# Patient Record
Sex: Male | Born: 1965 | Race: Black or African American | Hispanic: No | Smoking: Never smoker
Health system: Southern US, Community
[De-identification: ages and names within clinical notes are randomized; demographics above are authoritative.]

## PROBLEM LIST (undated history)

## (undated) DIAGNOSIS — K219 Gastro-esophageal reflux disease without esophagitis: Secondary | ICD-10-CM

## (undated) DIAGNOSIS — H269 Unspecified cataract: Secondary | ICD-10-CM

## (undated) DIAGNOSIS — M1990 Unspecified osteoarthritis, unspecified site: Secondary | ICD-10-CM

## (undated) DIAGNOSIS — C801 Malignant (primary) neoplasm, unspecified: Secondary | ICD-10-CM

## (undated) HISTORY — PX: NOSE SURGERY: SHX723

## (undated) HISTORY — DX: Unspecified cataract: H26.9

## (undated) HISTORY — PX: WISDOM TOOTH EXTRACTION: SHX21

## (undated) HISTORY — DX: Unspecified osteoarthritis, unspecified site: M19.90

## (undated) HISTORY — DX: Malignant (primary) neoplasm, unspecified: C80.1

---

## 2001-12-02 ENCOUNTER — Encounter: Payer: Self-pay | Admitting: Emergency Medicine

## 2001-12-02 ENCOUNTER — Emergency Department (HOSPITAL_COMMUNITY): Admission: EM | Admit: 2001-12-02 | Discharge: 2001-12-02 | Payer: Self-pay | Admitting: Emergency Medicine

## 2011-11-30 ENCOUNTER — Ambulatory Visit (INDEPENDENT_AMBULATORY_CARE_PROVIDER_SITE_OTHER): Payer: 59 | Admitting: Internal Medicine

## 2011-11-30 VITALS — BP 132/84 | HR 80 | Temp 98.1°F | Resp 18 | Ht 71.25 in | Wt 197.0 lb

## 2011-11-30 DIAGNOSIS — B360 Pityriasis versicolor: Secondary | ICD-10-CM

## 2011-11-30 DIAGNOSIS — R509 Fever, unspecified: Secondary | ICD-10-CM

## 2011-11-30 DIAGNOSIS — J029 Acute pharyngitis, unspecified: Secondary | ICD-10-CM

## 2011-11-30 DIAGNOSIS — J329 Chronic sinusitis, unspecified: Secondary | ICD-10-CM

## 2011-11-30 LAB — POCT RAPID STREP A (OFFICE): Rapid Strep A Screen: NEGATIVE

## 2011-11-30 MED ORDER — AMOXICILLIN 500 MG PO CAPS: 1000.0000 mg | ORAL_CAPSULE | Freq: Two times a day (BID) | ORAL | Status: AC

## 2011-11-30 MED ORDER — ITRACONAZOLE 100 MG PO CAPS: 200.0000 mg | ORAL_CAPSULE | Freq: Every day | ORAL | Status: AC

## 2011-11-30 MED ORDER — FLUTICASONE PROPIONATE 50 MCG/ACT NA SUSP: 2.0000 | Freq: Every day | NASAL | Status: DC

## 2011-11-30 NOTE — Progress Notes (Signed)
  Subjective:    Patient ID: Leata Mouse, male    DOB: 1966/04/19, 46 y.o.   MRN: [id number]  Sinusitis This is a new problem. The current episode started in the past 7 days. The problem is unchanged. There has been no fever. The pain is mild. Associated symptoms include chills, congestion, sinus pressure and a sore throat. Pertinent negatives include no coughing or ear pain. Past treatments include acetaminophen. The treatment provided no relief.      Review of Systems  Constitutional: Positive for chills.  HENT: Positive for congestion, sore throat and sinus pressure. Negative for ear pain.   Respiratory: Negative for cough.   Skin: Positive for rash.       Objective:   Physical Exam  Constitutional: He appears well-developed and well-nourished.  HENT:  Head: Normocephalic.  Mouth/Throat: No oropharyngeal exudate.       Nasal turbinates red and swollen with yellow drainage present.  Neck: Neck supple.  Cardiovascular: Normal rate, regular rhythm and normal heart sounds.   Pulmonary/Chest: Effort normal and breath sounds normal.  Abdominal: Soft.  Neurological: Coordination abnormal.  Skin: Rash noted.       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          Assessment & Plan:

## 2011-11-30 NOTE — Patient Instructions (Addendum)
Tinea Versicolor Tinea versicolor is a common yeast infection of the skin. This condition becomes known when the yeast on our skin starts to overgrow (yeast is a normal inhabitant on our skin). This condition is noticed as white or light brown patches on brown skin, and is more evident in the summer on tanned skin. These areas are slightly scaly if scratched. The light patches from the yeast become evident when the yeast creates "holes in your suntan". This is most often noticed in the summer. The patches are usually located on the chest, back, pubis, neck and body folds. However, it may occur on any area of body. Mild itching and inflammation (redness or soreness) may be present. DIAGNOSIS  The diagnosisof this is made clinically (by looking). Cultures from samples are usually not needed. Examination under the microscope may help. However, yeast is normally found on skin. The diagnosis still remains clinical. Examination under Wood's Ultraviolet Light can determine the extent of the infection. TREATMENT  This common infection is usually only of cosmetic (only a concern to your appearance). It is easily treated with dandruff shampoo used during showers or bathing. Vigorous scrubbing will eliminate the yeast over several days time. The light areas in your skin may remain for weeks or months after the infection is cured unless your skin is exposed to sunlight. The lighter or darker spots caused by the fungus that remain after complete treatment are not a sign of treatment failure; it will take a long time to resolve. Your caregiver may recommend a number of commercial preparations or medication by mouth if home care is not working. Recurrence is common and preventative medication may be necessary. This skin condition is not highly contagious. Special care is not needed to protect close friends and family members. Normal hygiene is usually enough. Follow up is required only if you develop complications (such as a  secondary infection from scratching), if recommended by your caregiver, or if no relief is obtained from the preparations used. Document Released: 10/06/2000 Document Revised: 06/21/2011 Document Reviewed: 11/18/2008 [employer] Patient Information 2012 [place], [place]. Sinusitis Sinuses are air pockets within the bones of your face. The growth of bacteria within a sinus leads to infection. The infection prevents the sinuses from draining. This infection is called sinusitis.  Take medication as prescribed.     SYMPTOMS  There will be different areas of pain depending on which sinuses have become infected.  The maxillary sinuses often produce pain beneath the eyes.   Frontal sinusitis may cause pain in the middle of the forehead and above the eyes.  Other problems (symptoms) include:  Toothaches.   Colored, pus-like (purulent) drainage from the nose.   Swelling, warmth, and tenderness over the sinus areas may be signs of infection.  TREATMENT  Sinusitis is most often determined by an exam.X-rays may be taken. If x-rays have been taken, make sure you obtain your results or find out how you are to obtain them. Your caregiver may give you medications (antibiotics). These are medications that will help kill the bacteria causing the infection. You may also be given a medication (decongestant) that helps to reduce sinus swelling.  HOME CARE INSTRUCTIONS   Only take over-the-counter or prescription medicines for pain, discomfort, or fever as directed by your caregiver.   Drink extra fluids. Fluids help thin the mucus so your sinuses can drain more easily.   Applying either moist heat or ice packs to the sinus areas may help relieve discomfort.   Use saline  nasal sprays to help moisten your sinuses. The sprays can be found at your local drugstore.  SEEK IMMEDIATE MEDICAL CARE IF:  You have a fever.   You have increasing pain, severe headaches, or toothache.   You have nausea, vomiting, or  drowsiness.   You develop unusual swelling around the face or trouble seeing.  MAKE SURE YOU:   Understand these instructions.   Will watch your condition.   Will get help right away if you are not doing well or get worse.  Document Released: 10/09/2005 Document Revised: 06/21/2011 Document Reviewed: 05/08/2007 [facility] Patient Information 2012 [place], [place].  Sinusitis Sinuses are air pockets within the bones of your face. The growth of bacteria within a sinus leads to infection. The infection prevents the sinuses from draining. This infection is called sinusitis. SYMPTOMS  There will be different areas of pain depending on which sinuses have become infected.  The maxillary sinuses often produce pain beneath the eyes.   Frontal sinusitis may cause pain in the middle of the forehead and above the eyes.  Other problems (symptoms) include:  Toothaches.   Colored, pus-like (purulent) drainage from the nose.   Swelling, warmth, and tenderness over the sinus areas may be signs of infection.  TREATMENT  Sinusitis is most often determined by an exam.X-rays may be taken. If x-rays have been taken, make sure you obtain your results or find out how you are to obtain them. Your caregiver may give you medications (antibiotics). These are medications that will help kill the bacteria causing the infection. You may also be given a medication (decongestant) that helps to reduce sinus swelling.  HOME CARE INSTRUCTIONS   Only take over-the-counter or prescription medicines for pain, discomfort, or fever as directed by your caregiver.   Drink extra fluids. Fluids help thin the mucus so your sinuses can drain more easily.   Applying either moist heat or ice packs to the sinus areas may help relieve discomfort.   Use saline nasal sprays to help moisten your sinuses. The sprays can be found at your local drugstore.  SEEK IMMEDIATE MEDICAL CARE IF:  You have a fever.   You have increasing pain,  severe headaches, or toothache.   You have nausea, vomiting, or drowsiness.   You develop unusual swelling around the face or trouble seeing.  MAKE SURE YOU:   Understand these instructions.   Will watch your condition.   Will get help right away if you are not doing well or get worse.  Document Released: 10/09/2005 Document Revised: 06/21/2011 Document Reviewed: 05/08/2007 [facility] Patient Information 2012 [place], [place].  Take all the amoxicillin to relieve your sinus pressure and discomfort.

## 2011-12-20 ENCOUNTER — Telehealth: Payer: Self-pay | Admitting: Internal Medicine

## 2011-12-20 MED ORDER — AMOXICILLIN 500 MG PO CAPS: 1000.0000 mg | ORAL_CAPSULE | Freq: Two times a day (BID) | ORAL | Status: AC

## 2011-12-20 NOTE — Telephone Encounter (Signed)
Please tell pt I have sent an prescription to [facility] on [address].

## 2011-12-21 ENCOUNTER — Telehealth: Payer: Self-pay | Admitting: Internal Medicine

## 2011-12-21 NOTE — Telephone Encounter (Signed)
Please tell pt I have sent a replacement Amoxicillin prescription to his pharmacy.   THanks.

## 2011-12-21 NOTE — Telephone Encounter (Signed)
LMOM we [facility]

## 2013-02-06 ENCOUNTER — Telehealth: Payer: Self-pay | Admitting: Radiology

## 2013-02-06 NOTE — Telephone Encounter (Signed)
Error

## 2014-04-08 ENCOUNTER — Ambulatory Visit (INDEPENDENT_AMBULATORY_CARE_PROVIDER_SITE_OTHER): Payer: 59

## 2014-04-08 ENCOUNTER — Ambulatory Visit (INDEPENDENT_AMBULATORY_CARE_PROVIDER_SITE_OTHER): Payer: 59 | Admitting: Family Medicine

## 2014-04-08 VITALS — BP 120/64 | HR 97 | Temp 100.9°F | Resp 15 | Ht 70.0 in | Wt 192.0 lb

## 2014-04-08 DIAGNOSIS — R059 Cough, unspecified: Secondary | ICD-10-CM

## 2014-04-08 DIAGNOSIS — R05 Cough: Secondary | ICD-10-CM

## 2014-04-08 DIAGNOSIS — J019 Acute sinusitis, unspecified: Secondary | ICD-10-CM

## 2014-04-08 DIAGNOSIS — J209 Acute bronchitis, unspecified: Secondary | ICD-10-CM

## 2014-04-08 MED ORDER — AZITHROMYCIN 250 MG PO TABS: ORAL_TABLET | ORAL | Status: DC

## 2014-04-08 MED ORDER — BENZONATATE 100 MG PO CAPS: 200.0000 mg | ORAL_CAPSULE | Freq: Two times a day (BID) | ORAL | Status: DC | PRN

## 2014-04-08 MED ORDER — IPRATROPIUM BROMIDE 0.03 % NA SOLN: 2.0000 | Freq: Two times a day (BID) | NASAL | Status: DC

## 2014-04-08 MED ORDER — HYDROCODONE-HOMATROPINE 5-1.5 MG/5ML PO SYRP: 5.0000 mL | ORAL_SOLUTION | Freq: Every evening | ORAL | Status: DC | PRN

## 2014-04-08 NOTE — Patient Instructions (Signed)
Acute Bronchitis °Bronchitis is inflammation of the airways that extend from the windpipe into the lungs (bronchi). The inflammation often causes mucus to develop. This leads to a cough, which is the most common symptom of bronchitis.  °In acute bronchitis, the condition usually develops suddenly and goes away over time, usually in a couple weeks. Smoking, allergies, and asthma can make bronchitis worse. Repeated episodes of bronchitis may cause further lung problems.  °CAUSES °Acute bronchitis is most often caused by the same virus that causes a cold. The virus can spread from person to person (contagious).  °SIGNS AND SYMPTOMS  °· Cough.   °· Fever.   °· Coughing up mucus.   °· Body aches.   °· Chest congestion.   °· Chills.   °· Shortness of breath.   °· Sore throat.   °DIAGNOSIS  °Acute bronchitis is usually diagnosed through a physical exam. Tests, such as chest X-rays, are sometimes done to rule out other conditions.  °TREATMENT  °Acute bronchitis usually goes away in a couple weeks. Often times, no medical treatment is necessary. Medicines are sometimes given for relief of fever or cough. Antibiotics are usually not needed but may be prescribed in certain situations. In some cases, an inhaler may be recommended to help reduce shortness of breath and control the cough. A cool mist vaporizer may also be used to help thin bronchial secretions and make it easier to clear the chest.  °HOME CARE INSTRUCTIONS °· Get plenty of rest.   °· Drink enough fluids to keep your urine clear or pale yellow (unless you have a medical condition that requires fluid restriction). Increasing fluids may help thin your secretions and will prevent dehydration.   °· Only take over-the-counter or prescription medicines as directed by your health care provider.   °· Avoid smoking and secondhand smoke. Exposure to cigarette smoke or irritating chemicals will make bronchitis worse. If you are a smoker, consider using nicotine gum or skin  patches to help control withdrawal symptoms. Quitting smoking will help your lungs heal faster.   °· Reduce the chances of another bout of acute bronchitis by washing your hands frequently, avoiding people with cold symptoms, and trying not to touch your hands to your mouth, nose, or eyes.   °· Follow up with your health care provider as directed.   °SEEK MEDICAL CARE IF: °Your symptoms do not improve after 1 week of treatment.  °SEEK IMMEDIATE MEDICAL CARE IF: °· You develop an increased fever or chills.   °· You have chest pain.   °· You have severe shortness of breath. °· You have bloody sputum.   °· You develop dehydration. °· You develop fainting. °· You develop repeated vomiting. °· You develop a severe headache. °MAKE SURE YOU:  °· Understand these instructions. °· Will watch your condition. °· Will get help right away if you are not doing well or get worse. °Document Released: 11/16/2004 Document Revised: 06/11/2013 Document Reviewed: 04/01/2013 °[employer]® Patient Information ©2015 [employer]. This information is not intended to replace advice given to you by your health care provider. Make sure you discuss any questions you have with your health care provider. ° °

## 2014-04-08 NOTE — Progress Notes (Addendum)
Chief Complaint:  Chief Complaint  Patient presents with  . Sinusitis    x 6 days. pt has taken antihistamines  . Cough    cough is worse at night    HPI: Ethan Martin is a 48 y.o. male who is here for 6 day history of up and down fevers, + sinus congestion, coughing up his PND, sort of greenish yellow and then clears up and then came back, he has been taking otc meds without relief, he denies SOB,  CP, however has had + chills, fatigue, has lost 12 lbs.  He feels like he is choking on his own saliva and has a garglng noise in his chest when he lays down.  He has hs no othe sick contacts, his daughter was sick.  No n/v/abd pain/rashes   No past medical history on file. No past surgical history on file. History   Social History  . Marital Status: Unknown    Spouse Name: N/A    Number of Children: N/A  . Years of Education: N/A   Social History Main Topics  . Smoking status: Never Smoker   . Smokeless tobacco: None  . Alcohol Use: None  . Drug Use: None  . Sexual Activity: None   Other Topics Concern  . None   Social History Narrative  . None   No family history on file. No Known Allergies Prior to Admission medications   Not on File     ROS: The patient denies night sweats, unintentional weight loss, chest pain, palpitations, wheezing, dyspnea on exertion, nausea, vomiting, abdominal pain, dysuria, hematuria, melena, numbness,  or tingling.   All other systems have been reviewed and were otherwise negative with the exception of those mentioned in the HPI and as above.    PHYSICAL EXAM: Filed Vitals:   04/08/14 1944  BP: 120/64  Pulse: 97  Temp: 100.9 F (38.3 C)  Resp: 15  SPo2 96% Filed Vitals:   04/08/14 1944  Height: 5\' 10"  (1.778 m)  Weight: 192 lb (87.091 kg)   Body mass index is 27.55 kg/(m^2).  General: Alert, no acute distress HEENT:  Normocephalic, atraumatic, oropharynx patent. EOMI, PERRLA, TM nl, fundo exam nl Cardiovascular:   Regular rate and rhythm, no rubs murmurs or gallops.  No Carotid bruits, radial pulse intact. No pedal edema.  Respiratory: Clear to auscultation bilaterally.  No wheezes, rales, or rhonchi.  No cyanosis, no use of accessory musculature GI: No organomegaly, abdomen is soft and non-tender, positive bowel sounds.  No masses. Skin: No rashes. Neurologic: Facial musculature symmetric. CN 2-12 grossly nl, no meningeal signs Psychiatric: Patient is appropriate throughout our interaction. Lymphatic: No cervical lymphadenopathy Musculoskeletal: Gait intact.   LABS: Results for orders placed in visit on 11/30/11  POCT RAPID STREP A (OFFICE)      Result Value Ref Range   Rapid Strep A Screen Negative  Negative     EKG/XRAY:   Primary read interpreted by [facility]. Marin Comment at [facility]. Increase Perihilar markings c/w bronchitis vs ? Left perihilar opacity   ASSESSMENT/PLAN: Encounter Diagnoses  Name Primary?  . Sinusitis, acute Yes  . Acute bronchitis   . Cough    Sinusitis and bronchitis with presumptive treatment for Lower resp infection ie PNA Rx azithromycin Rx Hycodan, tessalon perles, atrovent NS Note  for jury duty given F/u prn  Gross sideeffects, risk and benefits, and alternatives of medications d/w patient. Patient is aware that all medications have potential sideeffects and we  are unable to predict every sideeffect or drug-drug interaction that may occur.  LE, [place], DO 04/08/2014 8:39 PM    04/09/2014 @8 :10  Am LM  Called patient with official xray results, will get repeat xray in 4 weeks If no improvement in next 72 hours then he should call me back See below  COMPARISON: None.  FINDINGS:  Cardiopericardial silhouette within normal limits. There is patchy  retrocardiac density with increased density over the lower thoracic  spine on the lateral view. This is suspicious for small focus of  pneumonia. Bronchitic changes are also present. Right lung appears  clear.  Mediastinal contours normal.  IMPRESSION:  Left lower lobe airspace opacity suspicious for pneumonia. Followup  in 4-6 weeks to ensure radiographic clearing and exclude an  underlying lesion is recommended. Bronchitic changes are also  present.  These results will be called to the ordering clinician or  representative by the Radiologist Assistant, and communication  documented in the PACS or zVision Dashboard.  Electronically Signed  By: Dereck Ligas M.D.  On: 04/08/2014 20:37

## 2014-04-15 ENCOUNTER — Encounter: Payer: Self-pay | Admitting: Family Medicine

## 2014-09-14 ENCOUNTER — Ambulatory Visit (INDEPENDENT_AMBULATORY_CARE_PROVIDER_SITE_OTHER): Payer: 59 | Admitting: Family Medicine

## 2014-09-14 VITALS — BP 120/70 | HR 101 | Temp 101.7°F | Resp 18 | Ht 70.5 in | Wt 195.8 lb

## 2014-09-14 DIAGNOSIS — J029 Acute pharyngitis, unspecified: Secondary | ICD-10-CM

## 2014-09-14 DIAGNOSIS — R509 Fever, unspecified: Secondary | ICD-10-CM

## 2014-09-14 DIAGNOSIS — J020 Streptococcal pharyngitis: Secondary | ICD-10-CM

## 2014-09-14 DIAGNOSIS — M25522 Pain in left elbow: Secondary | ICD-10-CM

## 2014-09-14 LAB — POCT CBC
Granulocyte percent: 79.1 %G (ref 37–80)
HCT, POC: 47.2 % (ref 43.5–53.7)
HEMOGLOBIN: 15.4 g/dL (ref 14.1–18.1)
Lymph, poc: 2.3 (ref 0.6–3.4)
MCH, POC: 27.9 pg (ref 27–31.2)
MCHC: 32.6 g/dL (ref 31.8–35.4)
MCV: 85.8 fL (ref 80–97)
MID (cbc): 1.2 — AB (ref 0–0.9)
MPV: 7.7 fL (ref 0–99.8)
POC GRANULOCYTE: 13.4 — AB (ref 2–6.9)
POC LYMPH PERCENT: 13.6 %L (ref 10–50)
POC MID %: 7.3 % (ref 0–12)
Platelet Count, POC: 213 10*3/uL (ref 142–424)
RBC: 5.5 M/uL (ref 4.69–6.13)
RDW, POC: 13.9 %
WBC: 16.9 10*3/uL — AB (ref 4.6–10.2)

## 2014-09-14 LAB — POCT RAPID STREP A (OFFICE): RAPID STREP A SCREEN: POSITIVE — AB

## 2014-09-14 MED ORDER — PENICILLIN V POTASSIUM 500 MG PO TABS: 500.0000 mg | ORAL_TABLET | Freq: Two times a day (BID) | ORAL | Status: DC

## 2014-09-14 MED ORDER — IBUPROFEN 200 MG PO TABS: 600.0000 mg | ORAL_TABLET | Freq: Once | ORAL | Status: DC

## 2014-09-14 NOTE — Progress Notes (Signed)
[facility] [address], [place] [address] [phone]  Date:  09/14/2014   Name:  Ethan Martin   DOB:  12-13-1965   MRN:  [id number]  PCP:  No PCP Per Patient    Chief Complaint: Sore Throat; Headache; Generalized Body Aches; Nausea; Chills; and Elbow Pain   History of Present Illness:  Ethan Martin is a 48 y.o. very pleasant male patient who presents with the following:  He is here today with illness- he noted onset of chills, sweats, sore throat. No cough.  He also notes a problem with his left elbow but that has been going on for some months.   No sick contacts at home.    No meds so far.   He is generally in good health.  There are no active problems to display for this patient.   History reviewed. No pertinent past medical history.  History reviewed. No pertinent past surgical history.  History  Substance Use Topics  . Smoking status: Never Smoker   . Smokeless tobacco: Not on file  . Alcohol Use: Not on file    History reviewed. No pertinent family history.  No Known Allergies  Medication list has been reviewed and updated.  No current outpatient prescriptions on file prior to visit.   No current facility-administered medications on file prior to visit.    Review of Systems:  As per HPI- otherwise negative.   Physical Examination: Filed Vitals:   09/14/14 1056  BP: 120/70  Pulse: 101  Temp: 101.7 F (38.7 C)  Resp: 18   Filed Vitals:   09/14/14 1056  Height: 5' 10.5" (1.791 m)  Weight: 195 lb 12.8 oz (88.814 kg)   Body mass index is 27.69 kg/(m^2). Ideal Body Weight: Weight in (lb) to have BMI = 25: 176.4  GEN: WDWN, NAD, Non-toxic, A & O x 3 HEENT: Atraumatic, Normocephalic. Neck supple. No masses, No LAD.  Bilateral TM wnl, oropharynx normal.  PEERL,EOMI.   Ears and Nose: No external deformity. CV: RRR, No M/G/R. No JVD. No thrill. No extra heart sounds. PULM: CTA B, no wheezes, crackles, rhonchi. No retractions. No  resp. distress. No accessory muscle use. ABD: S, NT, ND, +BS. No rebound. No HSM. EXTR: No c/c/e NEURO Normal gait.  PSYCH: Normally interactive. Conversant. Not depressed or anxious appearing.  Calm demeanor.   Results for orders placed or performed in visit on 09/14/14  POCT CBC  Result Value Ref Range   WBC 16.9 (A) 4.6 - 10.2 K/uL   Lymph, poc 2.3 0.6 - 3.4   POC LYMPH PERCENT 13.6 10 - 50 %L   MID (cbc) 1.2 (A) 0 - 0.9   POC MID % 7.3 0 - 12 %M   POC Granulocyte 13.4 (A) 2 - 6.9   Granulocyte percent 79.1 37 - 80 %G   RBC 5.50 4.69 - 6.13 M/uL   Hemoglobin 15.4 14.1 - 18.1 g/dL   HCT, POC 47.2 43.5 - 53.7 %   MCV 85.8 80 - 97 fL   MCH, POC 27.9 27 - 31.2 pg   MCHC 32.6 31.8 - 35.4 g/dL   RDW, POC 13.9 %   Platelet Count, POC 213 142 - 424 K/uL   MPV 7.7 0 - 99.8 fL  POCT rapid strep A  Result Value Ref Range   Rapid Strep A Screen Positive (A) Negative   Assessment and Plan: Streptococcal sore throat - Plan: penicillin v potassium (VEETID) 500 MG tablet  Other specified fever - Plan: POCT CBC, ibuprofen (ADVIL,MOTRIN) tablet 600 mg  Sore throat - Plan: POCT rapid strep A  Left elbow pain - Plan: Ambulatory referral to Orthopedic Surgery  Treat for strep pharyngitis with penicillin.  Rest, fluids.  He will follow-up if not feeling better in the next few days- Sooner if worse.    Meds ordered this encounter  Medications  . ibuprofen (ADVIL,MOTRIN) tablet 600 mg    Sig:   . penicillin v potassium (VEETID) 500 MG tablet    Sig: Take 1 tablet (500 mg total) by mouth 2 (two) times daily.    Dispense:  20 tablet    Refill:  0     Signed Lamar Blinks, MD

## 2014-09-14 NOTE — Patient Instructions (Signed)
You have strep throat.  Rest and drink plenty of fluids, use ibuprofen and/ or tylenol for fever and take your antibiotics.  You should be feeling better in the next couple of days- let me know if you are not!

## 2020-08-04 ENCOUNTER — Ambulatory Visit (HOSPITAL_COMMUNITY)
Admission: EM | Admit: 2020-08-04 | Discharge: 2020-08-04 | Disposition: A | Discharge status: Home / Self Care | Payer: 59 | Attending: Internal Medicine | Admitting: Internal Medicine

## 2020-08-04 ENCOUNTER — Other Ambulatory Visit: Payer: Self-pay

## 2020-08-04 ENCOUNTER — Encounter (HOSPITAL_COMMUNITY): Payer: Self-pay | Admitting: Emergency Medicine

## 2020-08-04 DIAGNOSIS — K921 Melena: Secondary | ICD-10-CM | POA: Insufficient documentation

## 2020-08-04 LAB — CBC WITH DIFFERENTIAL/PLATELET
Abs Immature Granulocytes: 0.01 10*3/uL (ref 0.00–0.07)
Basophils Absolute: 0 10*3/uL (ref 0.0–0.1)
Basophils Relative: 1 %
Eosinophils Absolute: 0.1 10*3/uL (ref 0.0–0.5)
Eosinophils Relative: 2 %
HCT: 39.7 % (ref 39.0–52.0)
Hemoglobin: 12.6 g/dL — ABNORMAL LOW (ref 13.0–17.0)
Immature Granulocytes: 0 %
Lymphocytes Relative: 41 %
Lymphs Abs: 2.1 10*3/uL (ref 0.7–4.0)
MCH: 26.9 pg (ref 26.0–34.0)
MCHC: 31.7 g/dL (ref 30.0–36.0)
MCV: 84.6 fL (ref 80.0–100.0)
Monocytes Absolute: 0.5 10*3/uL (ref 0.1–1.0)
Monocytes Relative: 10 %
Neutro Abs: 2.4 10*3/uL (ref 1.7–7.7)
Neutrophils Relative %: 46 %
Platelets: 297 10*3/uL (ref 150–400)
RBC: 4.69 MIL/uL (ref 4.22–5.81)
RDW: 14.6 % (ref 11.5–15.5)
WBC: 5.1 10*3/uL (ref 4.0–10.5)
nRBC: 0 % (ref 0.0–0.2)

## 2020-08-04 LAB — BASIC METABOLIC PANEL
Anion gap: 9 (ref 5–15)
BUN: 12 mg/dL (ref 6–20)
CO2: 25 mmol/L (ref 22–32)
Calcium: 9.1 mg/dL (ref 8.9–10.3)
Chloride: 104 mmol/L (ref 98–111)
Creatinine, Ser: 1.14 mg/dL (ref 0.61–1.24)
GFR, Estimated: >60 mL/min (ref >60)
Glucose, Bld: 107 mg/dL — ABNORMAL HIGH (ref 70–99)
Potassium: 4 mmol/L (ref 3.5–5.1)
Sodium: 138 mmol/L (ref 135–145)

## 2020-08-04 NOTE — ED Provider Notes (Signed)
[place]    CSN: [id number] Arrival date & time: 08/04/20  1639      History   Chief Complaint Chief Complaint  Patient presents with  . Blood In Stools    HPI Ethan Martin is a 54 y.o. male comes to the urgent care with a 2-day history of dark-colored stool.  Patient noticed maroon stools yesterday.  He has had 3 bouts of maroon stools since yesterday.  Patient was constipated in the days preceding the morning cholesterol.  No abdominal pain or distention.  No abdominal bloating.  No nausea or vomiting.  No weight loss.  Patient denies any over-the-counter NSAID use.  No night sweats.  No history of peptic ulcer disease.  Patient has not had colonoscopy and has no follow-up with the primary care physician on a regular basis.  HPI  History reviewed. No pertinent past medical history.  There are no problems to display for this patient.   Past Surgical History:  Procedure Laterality Date  . NOSE SURGERY         Home Medications    Prior to Admission medications   Not on File    Family History Family History  Problem Relation Age of Onset  . Healthy Mother   . Healthy Father     Social History Social History   Tobacco Use  . Smoking status: Never Smoker  . Smokeless tobacco: Never Used  Vaping Use  . Vaping Use: Never used  Substance Use Topics  . Alcohol use: Yes    Alcohol/week: 5.0 - 6.0 standard drinks    Types: 2 Cans of beer, 3 - 4 Shots of liquor per week  . Drug use: Not on file     Allergies   Patient has no known allergies.   Review of Systems Review of Systems  Constitutional: Negative.   HENT: Negative.   Respiratory: Negative.   Gastrointestinal: Positive for blood in stool and constipation. Negative for abdominal pain, nausea, rectal pain and vomiting.  Neurological: Negative for dizziness, syncope, weakness and light-headedness.     Physical Exam Triage Vital Signs ED Triage Vitals  Enc Vitals Group     BP  08/04/20 1749 136/76     Pulse Rate 08/04/20 1749 81     Resp 08/04/20 1749 16     Temp 08/04/20 1749 98.1 F (36.7 C)     Temp Source 08/04/20 1749 Oral     SpO2 08/04/20 1749 100 %     Weight --      Height --      Head Circumference --      Peak Flow --      Pain Score 08/04/20 1746 0     Pain Loc --      Pain Edu? --      Excl. in [place]? --    No data found.  Updated Vital Signs BP 136/76 (BP Location: Right Arm)   Pulse 81   Temp 98.1 F (36.7 C) (Oral)   Resp 16   SpO2 100%   Visual Acuity Right Eye Distance:   Left Eye Distance:   Bilateral Distance:    Right Eye Near:   Left Eye Near:    Bilateral Near:     Physical Exam Vitals and nursing note reviewed.  Constitutional:      Appearance: Normal appearance.  Cardiovascular:     Pulses: Normal pulses.     Heart sounds: Normal heart sounds.  Pulmonary:  Effort: Pulmonary effort is normal.     Breath sounds: Normal breath sounds.  Abdominal:     General: Bowel sounds are normal.     Palpations: Abdomen is soft. There is no mass.     Tenderness: There is no abdominal tenderness. There is no guarding or rebound.     Hernia: No hernia is present.  Skin:    General: Skin is warm.     Coloration: Skin is not pale.     Findings: No bruising.  Neurological:     Mental Status: He is alert.      UC Treatments / Results  Labs (all labs ordered are listed, but only abnormal results are displayed) Labs Reviewed  CBC WITH DIFFERENTIAL/PLATELET  BASIC METABOLIC PANEL    EKG   Radiology No results found.  Procedures Procedures (including critical care time)  Medications Ordered in UC Medications - No data to display  Initial Impression / Assessment and Plan / UC Course  I have reviewed the triage vital signs and the nursing notes.  Pertinent labs & imaging results that were available during my care of the patient were reviewed by me and considered in my medical decision making (see chart for  details).     1.  Hematochezia: This GI bleed is likely secondary to a diverticular bleed since it is painless. CBC, BMP Patient is encouraged to establish care with a primary care physician so that he can get colonoscopy done If he has another maroon-colored bowel movement he is advised to go to the ED to be evaluated. If CBC is significant patient will require ED evaluation. Patient verbalized understanding of the plan of care. Final Clinical Impressions(s) / UC Diagnoses   Final diagnoses:  Hematochezia     Discharge Instructions     If you have another bout of maroon stool, please go to the emergency department for imaging If your blood work is abnormal, we will call you and directed to go to the emergency department.  Please sign up for MyChart to follow-up with your labs as well If you start experiencing palpitations, dizziness, shortness of breath or abdominal pain please go to the emergency department for further evaluation. Please call the internal medicine clinic to make an appointment.  You are past due for screening colonoscopy.   ED Prescriptions    None     PDMP not reviewed this encounter.   Chase Picket, MD 08/04/20 [phone]

## 2020-08-04 NOTE — ED Triage Notes (Signed)
Pt c/o blood in the stool onset yesterday. Pt states he has been somewhat constipated. He noticed dark red blood in the toilet and when wiping today x 2.

## 2020-08-04 NOTE — Discharge Instructions (Addendum)
If you have another bout of maroon stool, please go to the emergency department for imaging If your blood work is abnormal, we will call you and directed to go to the emergency department.  Please sign up for MyChart to follow-up with your labs as well If you start experiencing palpitations, dizziness, shortness of breath or abdominal pain please go to the emergency department for further evaluation. Please call the internal medicine clinic to make an appointment.  You are past due for screening colonoscopy.

## 2020-09-19 ENCOUNTER — Encounter (HOSPITAL_COMMUNITY): Payer: Self-pay | Admitting: Emergency Medicine

## 2020-09-19 ENCOUNTER — Other Ambulatory Visit: Payer: Self-pay

## 2020-09-19 ENCOUNTER — Emergency Department (HOSPITAL_COMMUNITY)
Admission: EM | Admit: 2020-09-19 | Discharge: 2020-09-19 | Disposition: A | Discharge status: Home / Self Care | Payer: 59 | Attending: Emergency Medicine | Admitting: Emergency Medicine

## 2020-09-19 DIAGNOSIS — K5900 Constipation, unspecified: Secondary | ICD-10-CM | POA: Insufficient documentation

## 2020-09-19 DIAGNOSIS — Z5321 Procedure and treatment not carried out due to patient leaving prior to being seen by health care provider: Secondary | ICD-10-CM | POA: Diagnosis not present

## 2020-09-19 DIAGNOSIS — K625 Hemorrhage of anus and rectum: Secondary | ICD-10-CM | POA: Diagnosis not present

## 2020-09-19 LAB — CBC
HCT: 40.8 % (ref 39.0–52.0)
Hemoglobin: 12.4 g/dL — ABNORMAL LOW (ref 13.0–17.0)
MCH: 25.6 pg — ABNORMAL LOW (ref 26.0–34.0)
MCHC: 30.4 g/dL (ref 30.0–36.0)
MCV: 84.3 fL (ref 80.0–100.0)
Platelets: 312 10*3/uL (ref 150–400)
RBC: 4.84 MIL/uL (ref 4.22–5.81)
RDW: 14.2 % (ref 11.5–15.5)
WBC: 5.8 10*3/uL (ref 4.0–10.5)
nRBC: 0 % (ref 0.0–0.2)

## 2020-09-19 LAB — COMPREHENSIVE METABOLIC PANEL
ALT: 14 U/L (ref 0–44)
AST: 19 U/L (ref 15–41)
Albumin: 4 g/dL (ref 3.5–5.0)
Alkaline Phosphatase: 80 U/L (ref 38–126)
Anion gap: 12 (ref 5–15)
BUN: 10 mg/dL (ref 6–20)
CO2: 25 mmol/L (ref 22–32)
Calcium: 9.2 mg/dL (ref 8.9–10.3)
Chloride: 102 mmol/L (ref 98–111)
Creatinine, Ser: 0.97 mg/dL (ref 0.61–1.24)
GFR, Estimated: >60 mL/min (ref >60)
Glucose, Bld: 94 mg/dL (ref 70–99)
Potassium: 3.9 mmol/L (ref 3.5–5.1)
Sodium: 139 mmol/L (ref 135–145)
Total Bilirubin: 0.6 mg/dL (ref 0.3–1.2)
Total Protein: 7.9 g/dL (ref 6.5–8.1)

## 2020-09-19 LAB — LIPASE, BLOOD: Lipase: 28 U/L (ref 11–51)

## 2020-09-19 NOTE — ED Notes (Signed)
LWBS 

## 2020-09-19 NOTE — ED Triage Notes (Signed)
Pt reports constipation.  Small BM earlier today with rectal bleeding.  States he was seen at [facility] a few weeks ago with blood in stool.

## 2020-09-20 LAB — URINALYSIS, ROUTINE W REFLEX MICROSCOPIC
Bilirubin Urine: NEGATIVE
Glucose, UA: NEGATIVE mg/dL
Hgb urine dipstick: NEGATIVE
Ketones, ur: NEGATIVE mg/dL
Leukocytes,Ua: NEGATIVE
Nitrite: NEGATIVE
Protein, ur: NEGATIVE mg/dL
Specific Gravity, Urine: 1.026 (ref 1.005–1.030)
pH: 5 (ref 5.0–8.0)

## 2020-09-21 ENCOUNTER — Other Ambulatory Visit: Payer: Self-pay

## 2020-09-21 ENCOUNTER — Emergency Department (HOSPITAL_COMMUNITY)
Admission: EM | Admit: 2020-09-21 | Discharge: 2020-09-22 | Disposition: A | Discharge status: Home / Self Care | Payer: 59 | Attending: Emergency Medicine | Admitting: Emergency Medicine

## 2020-09-21 ENCOUNTER — Encounter (HOSPITAL_COMMUNITY): Payer: Self-pay | Admitting: Emergency Medicine

## 2020-09-21 DIAGNOSIS — Z5321 Procedure and treatment not carried out due to patient leaving prior to being seen by health care provider: Secondary | ICD-10-CM | POA: Insufficient documentation

## 2020-09-21 DIAGNOSIS — K625 Hemorrhage of anus and rectum: Secondary | ICD-10-CM | POA: Insufficient documentation

## 2020-09-21 DIAGNOSIS — K5900 Constipation, unspecified: Secondary | ICD-10-CM | POA: Diagnosis present

## 2020-09-21 LAB — COMPREHENSIVE METABOLIC PANEL
ALT: 13 U/L (ref 0–44)
AST: 17 U/L (ref 15–41)
Albumin: 3.7 g/dL (ref 3.5–5.0)
Alkaline Phosphatase: 85 U/L (ref 38–126)
Anion gap: 12 (ref 5–15)
BUN: 15 mg/dL (ref 6–20)
CO2: 26 mmol/L (ref 22–32)
Calcium: 9.4 mg/dL (ref 8.9–10.3)
Chloride: 102 mmol/L (ref 98–111)
Creatinine, Ser: 1.1 mg/dL (ref 0.61–1.24)
GFR, Estimated: >60 mL/min (ref >60)
Glucose, Bld: 97 mg/dL (ref 70–99)
Potassium: 3.8 mmol/L (ref 3.5–5.1)
Sodium: 140 mmol/L (ref 135–145)
Total Bilirubin: 0.6 mg/dL (ref 0.3–1.2)
Total Protein: 7.6 g/dL (ref 6.5–8.1)

## 2020-09-21 LAB — CBC WITH DIFFERENTIAL/PLATELET
Abs Immature Granulocytes: 0.01 10*3/uL (ref 0.00–0.07)
Basophils Absolute: 0.1 10*3/uL (ref 0.0–0.1)
Basophils Relative: 1 %
Eosinophils Absolute: 0.2 10*3/uL (ref 0.0–0.5)
Eosinophils Relative: 2 %
HCT: 38.2 % — ABNORMAL LOW (ref 39.0–52.0)
Hemoglobin: 12 g/dL — ABNORMAL LOW (ref 13.0–17.0)
Immature Granulocytes: 0 %
Lymphocytes Relative: 47 %
Lymphs Abs: 3.1 10*3/uL (ref 0.7–4.0)
MCH: 26.3 pg (ref 26.0–34.0)
MCHC: 31.4 g/dL (ref 30.0–36.0)
MCV: 83.6 fL (ref 80.0–100.0)
Monocytes Absolute: 0.9 10*3/uL (ref 0.1–1.0)
Monocytes Relative: 14 %
Neutro Abs: 2.4 10*3/uL (ref 1.7–7.7)
Neutrophils Relative %: 36 %
Platelets: 326 10*3/uL (ref 150–400)
RBC: 4.57 MIL/uL (ref 4.22–5.81)
RDW: 14.1 % (ref 11.5–15.5)
WBC: 6.6 10*3/uL (ref 4.0–10.5)
nRBC: 0 % (ref 0.0–0.2)

## 2020-09-21 LAB — URINALYSIS, ROUTINE W REFLEX MICROSCOPIC
Bilirubin Urine: NEGATIVE
Glucose, UA: NEGATIVE mg/dL
Hgb urine dipstick: NEGATIVE
Ketones, ur: NEGATIVE mg/dL
Leukocytes,Ua: NEGATIVE
Nitrite: NEGATIVE
Protein, ur: NEGATIVE mg/dL
Specific Gravity, Urine: 1.029 (ref 1.005–1.030)
pH: 5 (ref 5.0–8.0)

## 2020-09-21 NOTE — ED Triage Notes (Signed)
Pt c/o being constipated for a few days.  St's after using the bathroom he has rectal bleeding   Pt denies abd pain

## 2020-09-22 NOTE — ED Notes (Signed)
No answer for vs x1, room x2

## 2021-04-07 NOTE — Progress Notes (Addendum)
[facility] Note  04/11/2021     CHIEF COMPLAINT Patient presents for Retina Evaluation   HISTORY OF PRESENT ILLNESS: Ethan Martin is a 55 y.o. male who presents to the clinic today for:   HPI     Retina Evaluation   In both eyes.  This started weeks ago.  Duration of weeks.  Context:  distance vision and near vision.  I, the attending physician,  performed the HPI with the patient and updated documentation appropriately.        Comments   Pt states his vision in his left eye is hazy.  States he only wears reading glasses but is getting Rx glasses today for distance and near.  Pt denies eye pain or discomfort.  Pt denies new or worsening floaters or fol OU.      Last edited by Bernarda Caffey, MD on 04/11/2021  9:46 AM.    Pt is here on the referral of Dr. 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 Katy Fitch for a routine eye exam and got a new rx for glasses, pt states Dr. Katy Fitch told him he saw blood in the back of his left eye, pt states he takes no medications, pt denies any hx of laser or sx  Referring physician: Debbra Riding, MD [address] [place],  [place] [address]  HISTORICAL INFORMATION:   Selected notes from the MEDICAL RECORD NUMBER Referred by Dr. Katy Fitch for eval of MAs LEE:  Ocular Hx- PMH-    CURRENT MEDICATIONS: No current outpatient medications on file. (Ophthalmic Drugs)   No current facility-administered medications for this visit. (Ophthalmic Drugs)   No current outpatient medications on file. (Other)   No current facility-administered medications for this visit. (Other)      REVIEW OF SYSTEMS: ROS   Positive for: Eyes Negative for: Constitutional, Gastrointestinal, Neurological, Skin, Genitourinary, Musculoskeletal, HENT, Endocrine, Cardiovascular, Respiratory, Psychiatric, Allergic/Imm, Heme/Lymph Last edited by Doneen Poisson on 04/11/2021  9:14 AM.       ALLERGIES No Known  Allergies  PAST MEDICAL HISTORY History reviewed. No pertinent past medical history. Past Surgical History:  Procedure Laterality Date   NOSE SURGERY      FAMILY HISTORY Family History  Problem Relation Age of Onset   Healthy Mother    Healthy Father     SOCIAL HISTORY Social History   Tobacco Use   Smoking status: Never   Smokeless tobacco: Never  Vaping Use   Vaping Use: Never used  Substance Use Topics   Alcohol use: Yes    Alcohol/week: 5.0 - 6.0 standard drinks    Types: 2 Cans of beer, 3 - 4 Shots of liquor per week         OPHTHALMIC EXAM:  Base Eye Exam     Visual Acuity (Snellen - Linear)       Right Left   Dist [place] 20/50 -2 20/50 -2   Dist ph [place] 20/20 -2 20/20 -2         Tonometry (Tonopen, 9:23 AM)       Right Left   Pressure 16 17         Pupils       Dark Light Shape React APD   Right 2 1 Round Brisk 0   Left 2 1 Round Brisk 0         Visual Fields       Left Right  Full Full         Extraocular Movement       Right Left    Full Full         Neuro/Psych     Oriented x3: Yes   Mood/Affect: Normal         Dilation     Both eyes: 1.0% Mydriacyl, 2.5% Phenylephrine @ 9:24 AM           Slit Lamp and Fundus Exam     Slit Lamp Exam       Right Left   Lids/Lashes Normal Normal   Conjunctiva/Sclera mild melanosis mild melanosis   Cornea arcus, tear film debris arcus, tear film debris   Anterior Chamber Deep and quiet Deep and quiet   Iris Round and dilated Round and dilated   Lens 1-2+ Nuclear sclerosis, 1-2+ Cortical cataract 1-2+ Nuclear sclerosis, 2+ Cortical cataract   Vitreous Vitreous syneresis Vitreous syneresis         Fundus Exam       Right Left   Disc Pink and Sharp, Compact Pink and Sharp, Compact, mild PPA   C/D Ratio 0.2 0.2   Macula Flat, Good foveal reflex, No heme or edema Flat, Good foveal reflex, mild RPE mottling, focal CWS along IT arteriole -- appears to be fading   Vessels  mild attenuation mild attenuation, mild Copper wiring, mild tortuousity   Periphery Attached, pigmented CR scarring vs pigmented pavig stone IT periphery, No RT/RD Attached, No RT/RD           Refraction     Manifest Refraction       Sphere Cylinder Axis Dist VA   Right +1.00 +0.25 080 20/20-1   Left +0.75 +0.50 040 20/20-1            IMAGING AND PROCEDURES  Imaging and Procedures for 04/11/2021  OCT, Retina - OU - Both Eyes       Right Eye Quality was good. Central Foveal Thickness: 333. Progression has no prior data. Findings include normal foveal contour, no IRF, no SRF, vitreomacular adhesion , retinal drusen (Focal PED temporal macula).   Left Eye Quality was good. Central Foveal Thickness: 315. Progression has no prior data. Findings include normal foveal contour, no IRF, no SRF, vitreomacular adhesion , retinal drusen (Focal patch of drusen SN macula).   Notes *Images captured and stored on drive  Diagnosis / Impression:  NFP, no IRF/SRF OU +drusen OU Focal PED OD  Clinical management:  See below  Abbreviations: NFP - Normal foveal profile. CME - cystoid macular edema. PED - pigment epithelial detachment. IRF - intraretinal fluid. SRF - subretinal fluid. EZ - ellipsoid zone. ERM - epiretinal membrane. ORA - outer retinal atrophy. ORT - outer retinal tubulation. SRHM - subretinal hyper-reflective material. IRHM - intraretinal hyper-reflective material              ASSESSMENT/PLAN:    ICD-10-CM   1. 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 Cotton Wool Spot OS  - focal CWS along IT arteriole -- no heme  - CWS very light and appears to be fading  - pt asymptomatic w/ BCVA 20/20 OU  - discussed diagnosis and prognosis  - recommend monitoring  - f/u  6 weeks, DFE, OCT  3. Retinoschisis  OD  - focal shallow retinoschisis, IT periphery  - +focal CR scarring surrouding focal schisis  - no RT/RD  - monitor  4. Mixed Cataract OU - The symptoms of cataract, surgical options, and treatments and risks were discussed with patient. - discussed diagnosis and progression - not yet visually significant - monitor   Ophthalmic Meds Ordered this visit:  No orders of the defined types were placed in this encounter.     Return in about 6 weeks (around 05/23/2021) for f/u CWS OS, DFE, OCT.  There are no Patient Instructions on file for this visit.  This document serves as a record of services personally performed by Gardiner Sleeper, MD, PhD. It was created on their behalf by Leeann Must, [place], an ophthalmic technician. The creation of this record is the provider's dictation and/or activities during the visit.    Electronically signed by: Leeann Must, COA @TODAY @ 1:32 PM  Gardiner Sleeper, M.D., Ph.D. Diseases & Surgery of the Retina and Oriental 04/11/2021   I have reviewed the above documentation for accuracy and completeness, and I agree with the above. Gardiner Sleeper, M.D., Ph.D. 04/11/21 1:32 PM   Abbreviations: M myopia (nearsighted); A astigmatism; H hyperopia (farsighted); P presbyopia; Mrx spectacle prescription;  CTL contact lenses; OD right eye; OS left eye; OU both eyes  XT exotropia; ET esotropia; PEK punctate epithelial keratitis; PEE punctate epithelial erosions; DES dry eye syndrome; MGD meibomian gland dysfunction; ATs artificial tears; PFAT's preservative free artificial tears; [place] nuclear sclerotic cataract; PSC posterior subcapsular cataract; ERM epi-retinal membrane; PVD posterior vitreous detachment; RD retinal detachment; DM diabetes mellitus; DR diabetic retinopathy; NPDR non-proliferative diabetic retinopathy; PDR proliferative diabetic retinopathy; CSME clinically significant macular edema; DME diabetic macular edema;  dbh dot blot hemorrhages; CWS cotton wool spot; POAG primary open angle glaucoma; C/D cup-to-disc ratio; HVF humphrey visual field; GVF goldmann visual field; OCT optical coherence tomography; IOP intraocular pressure; BRVO Branch retinal vein occlusion; CRVO central retinal vein occlusion; CRAO central retinal artery occlusion; BRAO branch retinal artery occlusion; RT retinal tear; SB scleral buckle; PPV pars plana vitrectomy; VH Vitreous hemorrhage; PRP panretinal laser photocoagulation; IVK intravitreal kenalog; VMT vitreomacular traction; MH Macular hole;  NVD neovascularization of the disc; NVE neovascularization elsewhere; AREDS age related eye disease study; ARMD age related macular degeneration; POAG primary open angle glaucoma; EBMD epithelial/anterior basement membrane dystrophy; ACIOL anterior chamber intraocular lens; IOL intraocular lens; PCIOL posterior chamber intraocular lens; Phaco/IOL phacoemulsification with intraocular lens placement; [place] photorefractive keratectomy; LASIK laser assisted in situ keratomileusis; HTN hypertension; DM diabetes mellitus; COPD chronic obstructive pulmonary disease

## 2021-04-11 ENCOUNTER — Ambulatory Visit (INDEPENDENT_AMBULATORY_CARE_PROVIDER_SITE_OTHER): Payer: 59 | Admitting: Ophthalmology

## 2021-04-11 ENCOUNTER — Encounter (INDEPENDENT_AMBULATORY_CARE_PROVIDER_SITE_OTHER): Payer: Self-pay | Admitting: Ophthalmology

## 2021-04-11 ENCOUNTER — Other Ambulatory Visit: Payer: Self-pay

## 2021-04-11 DIAGNOSIS — H3581 Retinal edema: Secondary | ICD-10-CM | POA: Diagnosis not present

## 2021-04-11 DIAGNOSIS — H25813 Combined forms of age-related cataract, bilateral: Secondary | ICD-10-CM

## 2021-04-11 DIAGNOSIS — H33101 Unspecified retinoschisis, right eye: Secondary | ICD-10-CM

## 2021-05-23 ENCOUNTER — Encounter (INDEPENDENT_AMBULATORY_CARE_PROVIDER_SITE_OTHER): Payer: 59 | Admitting: Ophthalmology

## 2021-05-23 DIAGNOSIS — H33101 Unspecified retinoschisis, right eye: Secondary | ICD-10-CM

## 2021-05-23 DIAGNOSIS — H25813 Combined forms of age-related cataract, bilateral: Secondary | ICD-10-CM

## 2021-05-23 DIAGNOSIS — H3581 Retinal edema: Secondary | ICD-10-CM

## 2021-06-29 ENCOUNTER — Encounter (INDEPENDENT_AMBULATORY_CARE_PROVIDER_SITE_OTHER): Payer: 59 | Admitting: Ophthalmology

## 2021-06-29 DIAGNOSIS — H33101 Unspecified retinoschisis, right eye: Secondary | ICD-10-CM

## 2021-06-29 DIAGNOSIS — H3581 Retinal edema: Secondary | ICD-10-CM

## 2021-06-29 DIAGNOSIS — H25813 Combined forms of age-related cataract, bilateral: Secondary | ICD-10-CM

## 2021-07-13 ENCOUNTER — Ambulatory Visit: Payer: 59 | Admitting: Physician Assistant

## 2021-07-15 ENCOUNTER — Other Ambulatory Visit: Payer: Self-pay

## 2021-07-15 ENCOUNTER — Encounter: Payer: Self-pay | Admitting: Gastroenterology

## 2021-07-15 ENCOUNTER — Other Ambulatory Visit (INDEPENDENT_AMBULATORY_CARE_PROVIDER_SITE_OTHER): Payer: 59

## 2021-07-15 ENCOUNTER — Ambulatory Visit (INDEPENDENT_AMBULATORY_CARE_PROVIDER_SITE_OTHER): Payer: 59 | Admitting: Gastroenterology

## 2021-07-15 VITALS — BP 136/82 | HR 78 | Ht 73.0 in | Wt 189.5 lb

## 2021-07-15 DIAGNOSIS — K5900 Constipation, unspecified: Secondary | ICD-10-CM

## 2021-07-15 DIAGNOSIS — Z1211 Encounter for screening for malignant neoplasm of colon: Secondary | ICD-10-CM

## 2021-07-15 DIAGNOSIS — R1084 Generalized abdominal pain: Secondary | ICD-10-CM

## 2021-07-15 DIAGNOSIS — K921 Melena: Secondary | ICD-10-CM | POA: Diagnosis not present

## 2021-07-15 LAB — CBC
HCT: 34.6 % — ABNORMAL LOW (ref 38.5–50.0)
Hemoglobin: 10.6 g/dL — ABNORMAL LOW (ref 13.2–17.1)
MCH: 23.7 pg — ABNORMAL LOW (ref 27.0–33.0)
MCHC: 30.6 g/dL — ABNORMAL LOW (ref 32.0–36.0)
MCV: 77.2 fL — ABNORMAL LOW (ref 80.0–100.0)
MPV: 10.4 fL (ref 7.5–12.5)
Platelets: 305 10*3/uL (ref 140–400)
RBC: 4.48 10*6/uL (ref 4.20–5.80)
RDW: 16.2 % — ABNORMAL HIGH (ref 11.0–15.0)
WBC: 4.3 10*3/uL (ref 3.8–10.8)

## 2021-07-15 LAB — TSH: TSH: 0.99 mIU/L (ref 0.40–4.50)

## 2021-07-15 LAB — BASIC METABOLIC PANEL
BUN: 9 mg/dL (ref 7–25)
CO2: 30 mmol/L (ref 20–32)
Calcium: 9.2 mg/dL (ref 8.6–10.3)
Chloride: 106 mmol/L (ref 98–110)
Creat: 0.92 mg/dL (ref 0.70–1.30)
Glucose, Bld: 76 mg/dL (ref 65–99)
Potassium: 4 mmol/L (ref 3.5–5.3)
Sodium: 142 mmol/L (ref 135–146)

## 2021-07-15 NOTE — Progress Notes (Signed)
Chief Complaint: Abdominal pain, constipation, hematochezia  Referring Provider:    Self  HPI:     Ethan Martin is a 55 y.o. male referred to the Gastroenterology Clinic for evaluation of multiple GI symptoms, to include abdominal pain, increased flatus, constipation, and rectal bleeding.  Abdominal pain started in the epigastrium 2 and half weeks ago and eventually became generalized abdominal pain.  Has had associated constipation.  Started taking a laxative with improvement. Pain improves with BM. Still with constipation and increased flatus. No n/v/f/c.   Separately, episode of BRBPR about 2 months ago, described as BRB on tissue paper. Has not recurred.   On review of history, was seen in Urgent Care on 08/04/2020 with maroon stools and constipation.  Work-up was largely unrevealing and discharged home. Patient states this lasted a couple days only.   No previous EGD/Colonoscopy.   No known family history of CRC, GI malignancy, liver disease, pancreatic disease, or IBD.    History reviewed. No pertinent past medical history.   Past Surgical History:  Procedure Laterality Date   NOSE SURGERY     Family History  Problem Relation Age of Onset   Healthy Mother    Healthy Father    Colon cancer Neg Hx    Esophageal cancer Neg Hx    Social History   Tobacco Use   Smoking status: Never   Smokeless tobacco: Never  Vaping Use   Vaping Use: Never used  Substance Use Topics   Alcohol use: Yes    Alcohol/week: 5.0 - 6.0 standard drinks    Types: 2 Cans of beer, 3 - 4 Shots of liquor per week   Drug use: Not Currently   No current outpatient medications on file.   No current facility-administered medications for this visit.   No Known Allergies   Review of Systems: All systems reviewed and negative except where noted in HPI.     Physical Exam:    Wt Readings from Last 3 Encounters:  07/15/21 189 lb 8 oz (86 kg)  09/21/20 203 lb (92.1 kg)  09/14/14  195 lb 12.8 oz (88.8 kg)    BP 136/82   Pulse 78   Ht 6\' 1"  (1.854 m)   Wt 189 lb 8 oz (86 kg)   BMI 25.00 kg/m  Constitutional:  Pleasant, in no acute distress. Psychiatric: Normal mood and affect. Behavior is normal. Cardiovascular: Normal rate, regular rhythm. No edema Pulmonary/chest: Effort normal and breath sounds normal. No wheezing, rales or rhonchi. Abdominal: Soft, nondistended, nontender. Bowel sounds active throughout. There are no masses palpable. No hepatomegaly. Neurological: Alert and oriented to person place and time. Skin: Skin is warm and dry. No rashes noted.   ASSESSMENT AND PLAN;   1) Hematochezia 2) Constipation 3) Change in bowel habits 4) Generalized abdominal pain  - Plan for colonoscopy to evaluate for mucosal/luminal pathology - If clinically significant hemorrhoids and recurrent symptoms, briefly discussed role of hemorrhoid band ligation in the future - Check CBC, BMP, TSH - Okay to continue use of OTC laxatives - Continue adequate hydration with at least 64 ounces of water/day - Plan for extended 2-day prep - If above work-up unrevealing and symptoms persist, can plan for either empiric trial of medications vs Sitz marker study vs ARM  5) Colon Cancer screening - No previous CRC screening - Scheduled for colonoscopy as above  The indications, risks, and benefits of colonoscopy were explained  to the patient in detail. Risks include but are not limited to bleeding, perforation, adverse reaction to medications, and cardiopulmonary compromise. Sequelae include but are not limited to the possibility of surgery, hospitalization, and mortality. The patient verbalized understanding and wished to proceed. All questions answered, referred to the scheduler and bowel prep ordered. Further recommendations pending results of the exam.    Dominic Pea Jenalee Trevizo, DO, FACG  07/15/2021, 1:37 PM   No ref. provider found

## 2021-07-15 NOTE — Patient Instructions (Signed)
If you are age 57 or older, your body mass index should be between 23-30. Your Body mass index is 25 kg/m. If this is out of the aforementioned range listed, please consider follow up with your Primary Care Provider.  If you are age 80 or younger, your body mass index should be between 19-25. Your Body mass index is 25 kg/m. If this is out of the aformentioned range listed, please consider follow up with your Primary Care Provider.   __________________________________________________________  The [facility] providers would like to encourage you to use [facility] to communicate with providers for non-urgent requests or questions.  Due to long hold times on the telephone, sending your provider a message by [facility] may be a faster and more efficient way to get a response.  Please allow 48 business hours for a response.  Please remember that this is for non-urgent requests.   Please go to the lab on the 2nd floor [address] before you leave the office today.   Due to recent changes in healthcare laws, you may see the results of your imaging and laboratory studies on MyChart before your provider has had a chance to review them.  We understand that in some cases there may be results that are confusing or concerning to you. Not all laboratory results come back in the same time frame and the provider may be waiting for multiple results in order to interpret others.  Please give [place] 48 hours in order for your provider to thoroughly review all the results before contacting the office for clarification of your results.   Thank you for choosing me and [facility].  Vito Cirigliano, D.O.

## 2021-07-18 ENCOUNTER — Telehealth: Payer: Self-pay | Admitting: Gastroenterology

## 2021-07-18 ENCOUNTER — Telehealth: Payer: Self-pay | Admitting: General Surgery

## 2021-07-18 DIAGNOSIS — D509 Iron deficiency anemia, unspecified: Secondary | ICD-10-CM

## 2021-07-18 NOTE — Telephone Encounter (Signed)
-----   Message from [place], DO sent at 07/18/2021  9:29 AM EDT ----- Labs notable for the following: -Hemoglobin down 2 g from the previous lab, currently at 10.6 (previously 12.0) along with decreased MCV (77) and elevated RDW (16), all suspicious for iron deficiency anemia.  Otherwise normal WBC, PLT. - Normal BMP and TSH.  - Please send to lab for iron panel, B12, folate.  Patient is scheduled for colonoscopy with extended 2-day prep.  However, if iron deficiency anemia, will also plan for EGD to be done at that same time.

## 2021-07-18 NOTE — Telephone Encounter (Signed)
Patient called states he did not receive his prep instructions nor the prep medication thinks its Gol[place]

## 2021-07-18 NOTE — Telephone Encounter (Signed)
Tried to contact the patient regarding his colon prep. Instructions were sent to his my chart. Patient was given a prep for colonoscopy of Clenpiq

## 2021-07-18 NOTE — Telephone Encounter (Signed)
Tried to contact the patient and his mailbox is full unable to leave a message. Placed order for labs to be drawn in [facility]

## 2021-07-19 ENCOUNTER — Other Ambulatory Visit (INDEPENDENT_AMBULATORY_CARE_PROVIDER_SITE_OTHER): Payer: 59

## 2021-07-19 DIAGNOSIS — D509 Iron deficiency anemia, unspecified: Secondary | ICD-10-CM

## 2021-07-19 LAB — IRON, TOTAL/TOTAL IRON BINDING CAP
%SAT: 16 % (calc) — ABNORMAL LOW (ref 20–48)
Iron: 72 ug/dL (ref 50–180)
TIBC: 452 mcg/dL (calc) — ABNORMAL HIGH (ref 250–425)

## 2021-07-19 LAB — VITAMIN B12: Vitamin B-12: 125 pg/mL — ABNORMAL LOW (ref 211–911)

## 2021-07-19 LAB — FOLATE: Folate: 9.2 ng/mL (ref >5.9)

## 2021-07-19 NOTE — Telephone Encounter (Signed)
Patient contacted the office regarding his labs. There was a note in his chart that stated he was contacted but no answer. Dr [name] wanted to add an EGD onto his colon on 07/20/2021. Patient is to go to [facility] for stat labs. Pt verbalized understanding

## 2021-07-19 NOTE — Telephone Encounter (Signed)
Spoke with the patient he is aware we are adding on an EGD and stat labs today.

## 2021-07-19 NOTE — Addendum Note (Signed)
Addended by: Lanny Hurst A on: 07/19/2021 10:08 AM   Modules accepted: Orders

## 2021-07-20 ENCOUNTER — Other Ambulatory Visit: Payer: Self-pay

## 2021-07-20 ENCOUNTER — Encounter: Payer: Self-pay | Admitting: Gastroenterology

## 2021-07-20 ENCOUNTER — Ambulatory Visit (AMBULATORY_SURGERY_CENTER): Payer: 59 | Admitting: Gastroenterology

## 2021-07-20 ENCOUNTER — Telehealth: Payer: Self-pay

## 2021-07-20 ENCOUNTER — Other Ambulatory Visit (INDEPENDENT_AMBULATORY_CARE_PROVIDER_SITE_OTHER): Payer: 59

## 2021-07-20 ENCOUNTER — Other Ambulatory Visit: Payer: 59

## 2021-07-20 VITALS — BP 129/76 | HR 73 | Temp 98.0°F | Resp 23 | Ht 73.0 in | Wt 189.0 lb

## 2021-07-20 DIAGNOSIS — R1084 Generalized abdominal pain: Secondary | ICD-10-CM | POA: Diagnosis not present

## 2021-07-20 DIAGNOSIS — K6389 Other specified diseases of intestine: Secondary | ICD-10-CM

## 2021-07-20 DIAGNOSIS — D509 Iron deficiency anemia, unspecified: Secondary | ICD-10-CM

## 2021-07-20 DIAGNOSIS — E538 Deficiency of other specified B group vitamins: Secondary | ICD-10-CM

## 2021-07-20 DIAGNOSIS — K5900 Constipation, unspecified: Secondary | ICD-10-CM | POA: Diagnosis not present

## 2021-07-20 DIAGNOSIS — K641 Second degree hemorrhoids: Secondary | ICD-10-CM

## 2021-07-20 DIAGNOSIS — R1013 Epigastric pain: Secondary | ICD-10-CM

## 2021-07-20 DIAGNOSIS — K921 Melena: Secondary | ICD-10-CM

## 2021-07-20 DIAGNOSIS — C187 Malignant neoplasm of sigmoid colon: Secondary | ICD-10-CM

## 2021-07-20 DIAGNOSIS — Z1211 Encounter for screening for malignant neoplasm of colon: Secondary | ICD-10-CM

## 2021-07-20 LAB — FERRITIN: Ferritin: 6.3 ng/mL — ABNORMAL LOW (ref 22.0–322.0)

## 2021-07-20 MED ORDER — FERROUS SULFATE 325 (65 FE) MG PO TBEC: 325.0000 mg | DELAYED_RELEASE_TABLET | Freq: Two times a day (BID) | ORAL | 6 refills | Status: DC

## 2021-07-20 MED ORDER — VITAMIN B-12 1000 MCG PO TABS: 1000.0000 ug | ORAL_TABLET | Freq: Every day | ORAL | 6 refills | Status: DC

## 2021-07-20 MED ORDER — SODIUM CHLORIDE 0.9 % IV SOLN: 500.0000 mL | Freq: Once | INTRAVENOUS | Status: DC

## 2021-07-20 NOTE — Telephone Encounter (Signed)
Cirigliano, Dominic Pea, DO  [place] Amon, [place], RN; [place], [place] A, [place] Unfortunately another new Colon CA. Obstructing mass in the sigmoid. I sent a message to the Colorectal surgeons already. Can we please make sure he has the following set up:  - CT chest/abd/pelvis  - CEA  - Referral to ColoRectal Surgery  - Follow-up with me in 2-3 weeks   Thanks!

## 2021-07-20 NOTE — Progress Notes (Signed)
Lidocaine 80mg  IV given to blunt gag reflex

## 2021-07-20 NOTE — Telephone Encounter (Signed)
Mailbox was full. Unable to leave message

## 2021-07-20 NOTE — Progress Notes (Signed)
   GASTROENTEROLOGY PROCEDURE H&P NOTE   Primary Care Physician: Patient, No Pcp Per (Inactive)    Reason for Procedure:   Epigastric pain, generalized abdominal pain, constipation, hematochezia, iron defiency and B12 deficiency anemia.   Plan:    EGD, Colonoscopy  Patient is appropriate for endoscopic procedure(s) in the ambulatory ([facility]) setting.  The nature of the procedure, as well as the risks, benefits, and alternatives were carefully and thoroughly reviewed with the patient. Ample time for discussion and questions allowed. The patient understood, was satisfied, and agreed to proceed.     HPI: Ethan Martin is a 55 y.o. male who presents for EGD and colonoscopy for evaluation of multiple GI symptoms, to include hematochezia, constipation, abdominal pain, along with B12 and iron deficiency anemia.  Patient was most recently seen in the Gastroenterology Clinic on 07/15/2021 by me.  No interval change in medical history since that appointment. Please refer to that note for full details regarding GI history and clinical presentation.   History reviewed. No pertinent past medical history.  Past Surgical History:  Procedure Laterality Date   NOSE SURGERY      Prior to Admission medications   Not on File    No current outpatient medications on file.   Current Facility-Administered Medications  Medication Dose Route Frequency Provider Last Rate Last Admin   0.9 %  sodium chloride infusion  500 mL Intravenous Once Andraya Frigon V, DO        Allergies as of 07/20/2021   (No Known Allergies)    Family History  Problem Relation Age of Onset   Healthy Mother    Healthy Father    Colon cancer Neg Hx    Esophageal cancer Neg Hx     Social History   Socioeconomic History   Marital status: Divorced    Spouse name: Not on file   Number of children: Not on file   Years of education: Not on file   Highest education level: Not on file  Occupational History   Not on file   Tobacco Use   Smoking status: Never   Smokeless tobacco: Never  Vaping Use   Vaping Use: Never used  Substance and Sexual Activity   Alcohol use: Yes    Alcohol/week: 5.0 - 6.0 standard drinks    Types: 2 Cans of beer, 3 - 4 Shots of liquor per week   Drug use: Not Currently   Sexual activity: Not on file  Other Topics Concern   Not on file  Social History Narrative   Not on file   Social Determinants of Health   Financial Resource Strain: Not on file  Food Insecurity: Not on file  Transportation Needs: Not on file  Physical Activity: Not on file  Stress: Not on file  Social Connections: Not on file  Intimate Partner Violence: Not on file    Physical Exam: Vital signs in last 24 hours: @BP  120/75   Pulse 68   Temp 98 F (36.7 C) (Temporal)   Ht 6\' 1"  (1.854 m)   Wt 189 lb (85.7 kg)   SpO2 99%   BMI 24.94 kg/m  GEN: NAD EYE: Sclerae anicteric ENT: MMM CV: Non-tachycardic Pulm: CTA b/l GI: Soft, NT/ND NEURO:  Alert & Oriented x 3   Gerrit Heck, DO [facility]   07/20/2021 10:21 AM

## 2021-07-20 NOTE — Progress Notes (Signed)
Lab add on for 9-28 with [facility] they said they will do it

## 2021-07-20 NOTE — Progress Notes (Signed)
Called to room to assist during endoscopic procedure.  Patient ID and intended procedure confirmed with present staff. Received instructions for my participation in the procedure from the performing physician.  

## 2021-07-20 NOTE — Op Note (Signed)
[facility] Patient Name: Ethan Martin Procedure Date: 07/20/2021 10:20 AM MRN: [id number] Endoscopist: Gerrit Heck , MD Age: 55 Referring MD:  Date of Birth: [birth year] Gender: Male Account #: [id number] Procedure:                Colonoscopy Indications:              Hematochezia, Iron deficiency anemia, Constipation Medicines:                Monitored Anesthesia Care Procedure:                Pre-Anesthesia Assessment:                           - Prior to the procedure, a History and Physical                            was performed, and patient medications and                            allergies were reviewed. The patient's tolerance of                            previous anesthesia was also reviewed. The risks                            and benefits of the procedure and the sedation                            options and risks were discussed with the patient.                            All questions were answered, and informed consent                            was obtained. Prior Anticoagulants: The patient has                            taken no previous anticoagulant or antiplatelet                            agents. ASA Grade Assessment: II - A patient with                            mild systemic disease. After reviewing the risks                            and benefits, the patient was deemed in                            satisfactory condition to undergo the procedure.                           After obtaining informed consent, the colonoscope  was passed under direct vision. Throughout the                            procedure, the patient's blood pressure, pulse, and                            oxygen saturations were monitored continuously. The                            Olympus CF-HQ190L (Serial# [id number]) Colonoscope was                            introduced through the anus with the intention of                            advancing to  the cecum. The scope was advanced to                            the sigmoid colon before the procedure was aborted                            due to an obstructing mass in the sigmoid colon.                            Medications were given. The colonoscope was removed                            and replaced with the [id number]-[id number] Slim SB                            Colonoscope. This was introduced through the anus                            and advanced to the sigmoid colon, but again could                            not traverse the obstructing mass. The patient                            tolerated the procedure well. The quality of the                            bowel preparation in the areas that were examined                            was otherwise was good. The rectum and extent                            reached (obstrucing mass) were photographed. Scope In: 10:49:42 AM Scope Out: 11:07:06 AM Total Procedure Duration: 0 hours 17 minutes 24 seconds  Findings:                 Hemorrhoids were found on perianal  exam.                           A frond-like/villous completely obstructing large                            mass was found in the sigmoid colon, located                            approximately 32 cm from the anal verge. Mild                            mucosal oozing and contact friability was present.                            This could not be traversed. Exchanged the                            colonoscope for a smaller caliber Slim scope, but                            still unable to traverse. This was biopsied with a                            cold forceps for histology and pathology sent rush.                            Area 2-3 cm distal to the mass was tattooed with an                            injection of 1 mL of Spot (carbon black). Estimated                            blood loss was minimal.                           Non-bleeding internal hemorrhoids were found  during                            retroflexion. The hemorrhoids were small and Grade                            II (internal hemorrhoids that prolapse but reduce                            spontaneously). Complications:            No immediate complications. Estimated Blood Loss:     Estimated blood loss was minimal. Impression:               - Hemorrhoids found on perianal exam.                           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 Biopsied. Tattooed.                           - Non-bleeding internal hemorrhoids. Recommendation:           - Patient has a contact number available for                            emergencies. The signs and symptoms of potential                            delayed complications were discussed with the                            patient. Return to normal activities tomorrow.                            Written discharge instructions were provided to the                            patient.                           - Resume previous diet.                           - Continue present medications.                           - Await pathology results.                           - Perform a CT scan (computed tomography) of chest,                            abdomen, and pelvis with contrast at the next                            available appointment.                           - Check CEA at the next available appointment.                           - Refer to a colo-rectal surgeon at the next                            available appointment.                           - Return to GI clinic in 2-3 weeks. Gerrit Heck, MD 07/20/2021 11:23:45 AM

## 2021-07-20 NOTE — Op Note (Signed)
[facility] Patient Name: Ethan Martin Procedure Date: 07/20/2021 10:20 AM MRN: [id number] Endoscopist: Gerrit Heck , MD Age: 55 Referring MD:  Date of Birth: 1966/03/10 Gender: Male Account #: [id number] Procedure:                Upper GI endoscopy Indications:              Epigastric abdominal pain, Generalized abdominal                            pain, Iron deficiency anemia, B12 deficiency,                            Hematochezia Medicines:                Monitored Anesthesia Care Procedure:                Pre-Anesthesia Assessment:                           - Prior to the procedure, a History and Physical                            was performed, and patient medications and                            allergies were reviewed. The patient's tolerance of                            previous anesthesia was also reviewed. The risks                            and benefits of the procedure and the sedation                            options and risks were discussed with the patient.                            All questions were answered, and informed consent                            was obtained. Prior Anticoagulants: The patient has                            taken no previous anticoagulant or antiplatelet                            agents. ASA Grade Assessment: II - A patient with                            mild systemic disease. After reviewing the risks                            and benefits, the patient was deemed in  satisfactory condition to undergo the procedure.                           After obtaining informed consent, the endoscope was                            passed under direct vision. Throughout the                            procedure, the patient's blood pressure, pulse, and                            oxygen saturations were monitored continuously. The                            Endoscope was introduced through the mouth, and                             advanced to the third part of duodenum. The upper                            GI endoscopy was accomplished without difficulty.                            The patient tolerated the procedure well. Scope In: Scope Out: Findings:                 The examined esophagus was normal.                           The Z-line was regular and was found 40 cm from the                            incisors.                           The entire examined stomach was normal. Biopsies                            were taken with a cold forceps for histology and                            Helicobacter pylori testing. Estimated blood loss                            was minimal.                           The examined duodenum was normal. Biopsies were                            taken with a cold forceps for histology. Estimated                            blood loss was minimal. Complications:  No immediate complications. Estimated Blood Loss:     Estimated blood loss was minimal. Impression:               - Normal esophagus.                           - Z-line regular, 40 cm from the incisors.                           - Normal stomach. Biopsied.                           - Normal examined duodenum. Biopsied. Recommendation:           - Patient has a contact number available for                            emergencies. The signs and symptoms of potential                            delayed complications were discussed with the                            patient. Return to normal activities tomorrow.                            Written discharge instructions were provided to the                            patient.                           - Resume previous diet.                           - Continue present medications.                           - Await pathology results.                           - Perform a colonoscopy today. Gerrit Heck, MD 07/20/2021 11:13:41 AM

## 2021-07-20 NOTE — Telephone Encounter (Signed)
-   Lab order in epic. - CT order in epic. High priority staff message sent to radiology scheduling to contact patient to set up appt - Urgent referral, records, demographic and insurance information faxed to [facility]. - Patient has been scheduled for a follow up with Dr. [name] on Monday, 08/08/21 at 1:40 PM. Letter mailed to patient with appt information.

## 2021-07-20 NOTE — Progress Notes (Signed)
Pt Drowsy. VSS. To PACU, report to RN. No anesthetic complications noted.  

## 2021-07-20 NOTE — Progress Notes (Signed)
Pt was HIPPA - Dr. [name] went over the findings with pt when he was awake.  Pt to the lab on discharge to have CEA level drawn.    The office will call pt to schedule CT of chest, abdomen and pelvis.  Two bottles of contrast was given to pt with instructions on how to drink contrast to be completes.    The office will call pt with an appointment for a colo-rectal surgeon for the next available appointment.  They will also schedule an appointment to follow up in the office in 2-3 weeks.  No problems were noted in the recovery room. maw

## 2021-07-20 NOTE — Progress Notes (Signed)
VS completed by [place].   Medical history reviewed and updated.

## 2021-07-20 NOTE — Telephone Encounter (Signed)
-----   Message from [place], DO sent at 07/20/2021  9:50 AM EDT ----- Labs are notable for iron deficiency and B12 deficiency.  Will plan on treating as follows: - Proceed with EGD and colonoscopy as scheduled today - Start ferrous sulfate 325 mg p.o. twice daily.  Take with vitamin C or orange juice. - Take iron supplement 2 hours before or 4 hours after PPI or other antacids as this requires some level of gastric acidity to aid in absorption - Please call the lab to see if they can run a ferritin off of the specimen that was already collected - B12 1000 mcg PO daily - Repeat iron panel with ferritin, B12, and CBC in 3 months

## 2021-07-20 NOTE — Telephone Encounter (Signed)
Lab will add on the ferritin.  Labs order placed for [facility] and medication sent in to the pharmacy as well

## 2021-07-20 NOTE — Patient Instructions (Addendum)
Handout was given to you on hemorrhoids. You may resume your current medications today. Await biopsy results.  Will be back in touch with you ASAP. The office will call you to schedule a CT scan of chest, abdomen, and pelvis with contrast at the next available appointment.  The office will also schedule a return office visit for 2-3 weeks. The office will schedule an appointment to see colo-rectal surgeon at the next available appointment. To the lab on discharge today for blood work. Please call if any questions or concerns.     YOU HAD AN ENDOSCOPIC PROCEDURE TODAY AT [facility]:   Refer to the procedure report that was given to you for any specific questions about what was found during the examination.  If the procedure report does not answer your questions, please call your gastroenterologist to clarify.  If you requested that your care partner not be given the details of your procedure findings, then the procedure report has been included in a sealed envelope for you to review at your convenience later.  YOU SHOULD EXPECT: Some feelings of bloating in the abdomen. Passage of more gas than usual.  Walking can help get rid of the air that was put into your GI tract during the procedure and reduce the bloating. If you had a lower endoscopy (such as a colonoscopy or flexible sigmoidoscopy) you may notice spotting of blood in your stool or on the toilet paper. If you underwent a bowel prep for your procedure, you may not have a normal bowel movement for a few days.  Please Note:  You might notice some irritation and congestion in your nose or some drainage.  This is from the oxygen used during your procedure.  There is no need for concern and it should clear up in a day or so.  SYMPTOMS TO REPORT IMMEDIATELY:  Following lower endoscopy (colonoscopy or flexible sigmoidoscopy):  Excessive amounts of blood in the stool  Significant tenderness or worsening of abdominal  pains  Swelling of the abdomen that is new, acute  Fever of 100F or higher  Following upper endoscopy (EGD)  Vomiting of blood or coffee ground material  New chest pain or pain under the shoulder blades  Painful or persistently difficult swallowing  New shortness of breath  Fever of 100F or higher  Black, tarry-looking stools  For urgent or emergent issues, a gastroenterologist can be reached at any hour by calling [phone]. Do not use MyChart messaging for urgent concerns.    DIET:  We do recommend a small meal at first, but then you may proceed to your regular diet.  Drink plenty of fluids but you should avoid alcoholic beverages for 24 hours.  ACTIVITY:  You should plan to take it easy for the rest of today and you should NOT DRIVE or use heavy machinery until tomorrow (because of the sedation medicines used during the test).    FOLLOW UP: Our staff will call the number listed on your records 48-72 hours following your procedure to check on you and address any questions or concerns that you may have regarding the information given to you following your procedure. If we do not reach you, we will leave a message.  We will attempt to reach you two times.  During this call, we will ask if you have developed any symptoms of COVID 19. If you develop any symptoms (ie: fever, flu-like symptoms, shortness of breath, cough etc.) before then, please call [phone].  If  you test positive for Covid 19 in the 2 weeks post procedure, please call and report this information to [place].    If any biopsies were taken you will be contacted by phone or by letter within the next 1-3 weeks.  Please call us at [phone] if you have not heard about the biopsies in 3 weeks.    SIGNATURES/CONFIDENTIALITY: You and/or your care partner have signed paperwork which will be entered into your electronic medical record.  These signatures attest to the fact that that the information above on your After Visit  Summary has been reviewed and is understood.  Full responsibility of the confidentiality of this discharge information lies with you and/or your care-partner.

## 2021-07-21 LAB — CEA: CEA: 3.2 ng/mL — ABNORMAL HIGH

## 2021-07-22 ENCOUNTER — Telehealth: Payer: Self-pay | Admitting: *Deleted

## 2021-07-22 NOTE — Telephone Encounter (Signed)
  Follow up Call-  Call back number 07/20/2021  Post procedure Call Back phone  # [phone]  Permission to leave phone message Yes  Some recent data might be hidden     Patient questions:  Do you have a fever, pain , or abdominal swelling? No. Pain Score  0 *  Have you tolerated food without any problems? Yes.    Have you been able to return to your normal activities? Yes.    Do you have any questions about your discharge instructions: Diet   Yes reviewed foods he could eat Medications  No. Follow up visit  No.  Do you have questions or concerns about your Care? No.  Actions: * If pain score is 4 or above: No action needed, pain <4.Have you developed a fever since your procedure? no  2.   Have you had an respiratory symptoms (SOB or cough) since your procedure? no  3.   Have you tested positive for COVID 19 since your procedure no  4.   Have you had any family members/close contacts diagnosed with the COVID 19 since your procedure?  no   If yes to any of these questions please route to Joylene John, RN and Joella Prince, RN

## 2021-07-23 DIAGNOSIS — C801 Malignant (primary) neoplasm, unspecified: Secondary | ICD-10-CM

## 2021-07-23 HISTORY — DX: Malignant (primary) neoplasm, unspecified: C80.1

## 2021-07-28 ENCOUNTER — Ambulatory Visit (HOSPITAL_COMMUNITY)
Admission: RE | Admit: 2021-07-28 | Discharge: 2021-07-28 | Disposition: A | Discharge status: Home / Self Care | Payer: 59 | Source: Ambulatory Visit | Attending: Gastroenterology | Admitting: Gastroenterology

## 2021-07-28 DIAGNOSIS — K6389 Other specified diseases of intestine: Secondary | ICD-10-CM

## 2021-07-28 MED ORDER — IOHEXOL 350 MG/ML SOLN
80.0000 mL | Freq: Once | INTRAVENOUS | Status: AC | PRN
  Administered 2021-07-28: 80 mL via INTRAVENOUS

## 2021-07-29 NOTE — Progress Notes (Addendum)
[facility] Note  08/03/2021     CHIEF COMPLAINT Patient presents for Retina Follow Up   HISTORY OF PRESENT ILLNESS: Ethan Martin is a 55 y.o. male who presents to the clinic today for:  HPI     Retina Follow Up   Patient presents with  Other.  In left eye.  This started weeks ago.  Severity is moderate.  Duration of weeks.  Since onset it is gradually worsening.  I, the attending physician,  performed the HPI with the patient and updated documentation appropriately.        Comments   Pt states vision OS is getting worse.  Pt states when light is present in OS, it makes vision very blurry.  Pt denies eye pain or discomfort.  Pt denies new or worsening floaters or fol OU.      Last edited by Bernarda Caffey, MD on 08/06/2021 10:04 PM.    VA OS is a bit cloudy; noted mostly when looking at bright lights.  Referring physician: No referring provider defined for this encounter.  HISTORICAL INFORMATION:   Selected notes from the MEDICAL RECORD NUMBER Referred by Dr. Katy Fitch for eval of MAs   CURRENT MEDICATIONS: No current outpatient medications on file. (Ophthalmic Drugs)   No current facility-administered medications for this visit. (Ophthalmic Drugs)   Current Outpatient Medications (Other)  Medication Sig   ferrous sulfate 325 (65 FE) MG EC tablet Take 1 tablet (325 mg total) by mouth 2 (two) times daily. Take with vitamin C or orange juice please. Take iron supplement 2 hours before or 4 hours after PPI or other antacids   vitamin B-12 (CYANOCOBALAMIN) 1000 MCG tablet Take 1 tablet (1,000 mcg total) by mouth daily.   No current facility-administered medications for this visit. (Other)   REVIEW OF SYSTEMS: ROS   Positive for: Eyes Negative for: Constitutional, Gastrointestinal, Neurological, Skin, Genitourinary, Musculoskeletal, HENT, Endocrine, Cardiovascular, Respiratory, Psychiatric, Allergic/Imm, Heme/Lymph Last edited by Doneen Poisson  on 08/03/2021 10:17 AM.    ALLERGIES No Known Allergies  PAST MEDICAL HISTORY Past Medical History:  Diagnosis Date   Arthritis    Cataract    Past Surgical History:  Procedure Laterality Date   NOSE SURGERY      FAMILY HISTORY Family History  Problem Relation Age of Onset   Healthy Mother    Healthy Father    Colon cancer Neg Hx    Esophageal cancer Neg Hx    Rectal cancer Neg Hx    Stomach cancer Neg Hx     SOCIAL HISTORY Social History   Tobacco Use   Smoking status: Never   Smokeless tobacco: Never  Vaping Use   Vaping Use: Never used  Substance Use Topics   Alcohol use: Yes    Alcohol/week: 5.0 - 6.0 standard drinks    Types: 2 Cans of beer, 3 - 4 Shots of liquor per week   Drug use: Not Currently       OPHTHALMIC EXAM:  Base Eye Exam     Visual Acuity (Snellen - Linear)       Right Left   Dist [place] 20/25 -2 20/20 -2   Dist ph [place] 20/20 -2          Tonometry (Tonopen, 10:16 AM)       Right Left   Pressure 16 16         Pupils       Dark Light Shape React APD  Right 3 2 Round Brisk 0   Left 3 2 Round Brisk 0         Visual Fields       Left Right    Full Full         Extraocular Movement       Right Left    Full Full         Neuro/Psych     Oriented x3: Yes   Mood/Affect: Normal         Dilation     Both eyes: 1.0% Mydriacyl, 2.5% Phenylephrine @ 10:16 AM           Slit Lamp and Fundus Exam     Slit Lamp Exam       Right Left   Lids/Lashes Normal Normal   Conjunctiva/Sclera mild melanosis mild melanosis   Cornea arcus, trace tear film debris arcus, tear film debris   Anterior Chamber Deep and quiet Deep and quiet   Iris Round and dilated Round and dilated   Lens 2+ Nuclear sclerosis, 2+ Cortical cataract 2+ Nuclear sclerosis, 2-3+ Cortical cataract   Vitreous Vitreous syneresis Vitreous syneresis         Fundus Exam       Right Left   Disc Pink and Sharp, Compact Pink and Sharp, Compact,  mild PPA   C/D Ratio 0.2 0.2   Macula Flat, Good foveal reflex, No heme or edema Flat, Good foveal reflex, mild RPE mottling, focal CWS along IT arteriole -- resolved   Vessels mild attenuation mild attenuation, mild Copper wiring, mild tortuousity   Periphery Attached, pigmented CR scarring vs pigmented pavig stone IT periphery, No RT/RD Attached, No RT/RD            IMAGING AND PROCEDURES  Imaging and Procedures for 08/03/2021  OCT, Retina - OU - Both Eyes       Right Eye Quality was good. Central Foveal Thickness: 328. Progression has been stable. Findings include normal foveal contour, no IRF, no SRF, vitreomacular adhesion , retinal drusen (Focal PED temporal macula).   Left Eye Quality was good. Central Foveal Thickness: 313. Progression has been stable. Findings include normal foveal contour, no IRF, no SRF, vitreomacular adhesion , retinal drusen (Focal patch of drusen SN macula).   Notes *Images captured and stored on drive  Diagnosis / Impression:  NFP, no IRF/SRF OU +drusen OU Focal PED OD  Clinical management:  See below  Abbreviations: NFP - Normal foveal profile. CME - cystoid macular edema. PED - pigment epithelial detachment. IRF - intraretinal fluid. SRF - subretinal fluid. EZ - ellipsoid zone. ERM - epiretinal membrane. ORA - outer retinal atrophy. ORT - outer retinal tubulation. SRHM - subretinal hyper-reflective material. IRHM - intraretinal hyper-reflective material            ASSESSMENT/PLAN:    ICD-10-CM   1. 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 Combined forms of age-related cataract of both eyes  H25.813     1,2. Cotton Wool Spot OS  - focal CWS along IT arteriole -- fully resolved  - BCVA 20/20 OU  - pt is cleared from a retina standpoint for release to Dr. Katy Fitch and resumption of primary eye care   3. Retinoschisis OD  - focal shallow  retinoschisis, IT periphery  - +focal CR scarring surrouding focal schisis  -  no RT/RD  - monitor  4. Mixed Cataract OU - The symptoms of cataract, surgical options, and treatments and risks were discussed with patient. - discussed diagnosis and progression - approaching visual significance - monitor  Ophthalmic Meds Ordered this visit:  No orders of the defined types were placed in this encounter.    Return if symptoms worsen or fail to improve.  There are no Patient Instructions on file for this visit.  This document serves as a record of services personally performed by Gardiner Sleeper, MD, PhD. It was created on their behalf by Orvan Falconer, an ophthalmic technician. The creation of this record is the provider's dictation and/or activities during the visit.    Electronically signed by: Orvan Falconer, OA, 08/06/21  10:13 PM   This document serves as a record of services personally performed by Gardiner Sleeper, MD, PhD. It was created on their behalf by Estill Bakes, COT an ophthalmic technician. The creation of this record is the provider's dictation and/or activities during the visit.    Electronically signed by: Estill Bakes, [place] 10.12.22 @ 10:13 PM   Gardiner Sleeper, M.D., Ph.D. Diseases & Surgery of the Retina and [place] 10.12.22   I have reviewed the above documentation for accuracy and completeness, and I agree with the above. Gardiner Sleeper, M.D., Ph.D. 08/06/21 10:13 PM   Abbreviations: M myopia (nearsighted); A astigmatism; H hyperopia (farsighted); P presbyopia; Mrx spectacle prescription;  CTL contact lenses; OD right eye; OS left eye; OU both eyes  XT exotropia; ET esotropia; PEK punctate epithelial keratitis; PEE punctate epithelial erosions; DES dry eye syndrome; MGD meibomian gland dysfunction; ATs artificial tears; PFAT's preservative free artificial tears; [place] nuclear sclerotic cataract; PSC posterior subcapsular  cataract; ERM epi-retinal membrane; PVD posterior vitreous detachment; RD retinal detachment; DM diabetes mellitus; DR diabetic retinopathy; NPDR non-proliferative diabetic retinopathy; PDR proliferative diabetic retinopathy; CSME clinically significant macular edema; DME diabetic macular edema; dbh dot blot hemorrhages; CWS cotton wool spot; POAG primary open angle glaucoma; C/D cup-to-disc ratio; HVF humphrey visual field; GVF goldmann visual field; OCT optical coherence tomography; IOP intraocular pressure; BRVO Branch retinal vein occlusion; CRVO central retinal vein occlusion; CRAO central retinal artery occlusion; BRAO branch retinal artery occlusion; RT retinal tear; SB scleral buckle; PPV pars plana vitrectomy; VH Vitreous hemorrhage; PRP panretinal laser photocoagulation; IVK intravitreal kenalog; VMT vitreomacular traction; MH Macular hole;  NVD neovascularization of the disc; NVE neovascularization elsewhere; AREDS age related eye disease study; ARMD age related macular degeneration; POAG primary open angle glaucoma; EBMD epithelial/anterior basement membrane dystrophy; ACIOL anterior chamber intraocular lens; IOL intraocular lens; PCIOL posterior chamber intraocular lens; Phaco/IOL phacoemulsification with intraocular lens placement; [place] photorefractive keratectomy; LASIK laser assisted in situ keratomileusis; HTN hypertension; DM diabetes mellitus; COPD chronic obstructive pulmonary disease

## 2021-08-03 ENCOUNTER — Other Ambulatory Visit: Payer: Self-pay

## 2021-08-03 ENCOUNTER — Ambulatory Visit (INDEPENDENT_AMBULATORY_CARE_PROVIDER_SITE_OTHER): Payer: 59 | Admitting: Ophthalmology

## 2021-08-03 DIAGNOSIS — H33101 Unspecified retinoschisis, right eye: Secondary | ICD-10-CM | POA: Diagnosis not present

## 2021-08-03 DIAGNOSIS — H25813 Combined forms of age-related cataract, bilateral: Secondary | ICD-10-CM

## 2021-08-03 DIAGNOSIS — H3581 Retinal edema: Secondary | ICD-10-CM

## 2021-08-06 ENCOUNTER — Encounter (INDEPENDENT_AMBULATORY_CARE_PROVIDER_SITE_OTHER): Payer: Self-pay | Admitting: Ophthalmology

## 2021-08-08 ENCOUNTER — Ambulatory Visit: Payer: 59 | Admitting: Gastroenterology

## 2021-08-10 NOTE — Progress Notes (Signed)
Please enter orders for PAT visit scheduled for 10--26-22.

## 2021-08-16 NOTE — Patient Instructions (Addendum)
DUE TO COVID-19 ONLY ONE VISITOR IS ALLOWED TO COME WITH YOU AND STAY IN THE WAITING ROOM ONLY DURING PRE OP AND PROCEDURE.   **NO VISITORS ARE ALLOWED IN THE SHORT STAY AREA OR RECOVERY ROOM!!**  IF YOU WILL BE ADMITTED INTO THE HOSPITAL YOU ARE ALLOWED ONLY TWO SUPPORT PEOPLE DURING VISITATION HOURS ONLY (10AM -8PM)   The support person(s) may change daily. The support person(s) must pass our screening, gel in and out, and wear a mask at all times, including in the patient's room. Patients must also wear a mask when staff or their support person are in the room.  No visitors under the age of 76. Any visitor under the age of 68 must be accompanied by an adult.    COVID SWAB TESTING MUST BE COMPLETED ON:  08/29/21 **MUST PRESENT COMPLETED FORM AT TESTING SITE**    [place]  (backside of the building) Open 8am-3pm. No appointment needed. You are not required to quarantine, however you are required to wear a well-fitted mask when you are out and around people not in your household.  Hand Hygiene often Do NOT share personal items Notify your provider if you are in close contact with someone who has COVID or you develop fever 100.4 or greater, new onset of sneezing, cough, sore throat, shortness of breath or body aches.       Your procedure is scheduled on: 08/31/21   Report to [facility] Main Entrance    Report to admitting at 8:45 AM   Call this number if you have problems the morning of surgery [phone]   Follow instructions from surgeons office for diet and bowel prep day before surgery.   May have liquids until 8:00 AM day of surgery  CLEAR LIQUID DIET  Foods Allowed                                                                     Foods Excluded  Water, Black Coffee and tea (no milk or creamer)           liquids that you cannot  Plain Jell-O in any flavor  (No red)                                    see through such as: Fruit ices (not  with fruit pulp)                                           milk, soups, orange juice              Iced Popsicles (No red)                                               All solid food  Apple juices Sports drinks like Gatorade (No red) Lightly seasoned clear broth or consume(fat free) Sugar  Sample Menu Breakfast                                Lunch                                     Supper Cranberry juice                    Beef broth                            Chicken broth Jell-O                                     Grape juice                           Apple juice Coffee or tea                        Jell-O                                      Popsicle                                                Coffee or tea                        Coffee or tea      Complete one Ensure drink the morning of surgery at       the day of surgery.   Drink 2 Ensure drinks the night before surgery.  Complete one Ensure drink the morning of surgery 3 hours prior to scheduled surgery.  Oral Hygiene is also important to reduce your risk of infection.                                    Remember - BRUSH YOUR TEETH THE MORNING OF SURGERY WITH YOUR REGULAR TOOTHPASTE   Take these medicines the morning of surgery with A SIP OF WATER: none                              You may not have any metal on your body including jewelry, and body piercing             Do not wear lotions, powders, cologne, or deodorant              Men may shave face and neck.   Do not bring valuables to the hospital. [place].   Bring small overnight bag day of surgery.   Special Instructions: Bring a  copy of your healthcare power of attorney and living will documents         the day of surgery if you haven't scanned them before.   Please read over the following fact sheets you were given: IF YOU HAVE QUESTIONS ABOUT YOUR PRE-OP INSTRUCTIONS PLEASE CALL  [place] [facility] - Preparing for Surgery Before surgery, you can play an important role.  Because skin is not sterile, your skin needs to be as free of germs as possible.  You can reduce the number of germs on your skin by washing with CHG (chlorahexidine gluconate) soap before surgery.  CHG is an antiseptic cleaner which kills germs and bonds with the skin to continue killing germs even after washing. Please DO NOT use if you have an allergy to CHG or antibacterial soaps.  If your skin becomes reddened/irritated stop using the CHG and inform your nurse when you arrive at Short Stay. Do not shave (including legs and underarms) for at least 48 hours prior to the first CHG shower.  You may shave your face/neck.  Please follow these instructions carefully:  1.  Shower with CHG Soap the night before surgery and the  morning of surgery.  2.  If you choose to wash your hair, wash your hair first as usual with your normal  shampoo.  3.  After you shampoo, rinse your hair and body thoroughly to remove the shampoo.                             4.  Use CHG as you would any other liquid soap.  You can apply chg directly to the skin and wash.  Gently with a scrungie or clean washcloth.  5.  Apply the CHG Soap to your body ONLY FROM THE NECK DOWN.   Do   not use on face/ open                           Wound or open sores. Avoid contact with eyes, ears mouth and   genitals (private parts).                       Wash face,  Genitals (private parts) with your normal soap.             6.  Wash thoroughly, paying special attention to the area where your    surgery  will be performed.  7.  Thoroughly rinse your body with warm water from the neck down.  8.  DO NOT shower/wash with your normal soap after using and rinsing off the CHG Soap.                9.  Pat yourself dry with a clean towel.            10.  Wear clean pajamas.            11.  Place clean sheets on your bed the night of your first  shower and do not  sleep with pets. Day of Surgery : Do not apply any lotions/deodorants the morning of surgery.  Please wear clean clothes to the hospital/surgery center.  FAILURE TO FOLLOW THESE INSTRUCTIONS MAY RESULT IN THE CANCELLATION OF YOUR SURGERY  PATIENT SIGNATURE_________________________________  NURSE SIGNATURE__________________________________  ________________________________________________________________________

## 2021-08-16 NOTE — Progress Notes (Addendum)
COVID swab appointment: 08/29/21  COVID Vaccine Completed:no Date COVID Vaccine completed: Has received booster: COVID vaccine manufacturer: [employer]   Date of COVID positive in last 90 days: no  PCP - not currently Cardiologist - n/a  Chest x-ray - CT 07/29/21 Epic EKG - n/a Stress Test - years ago per pt ECHO - years ago per pt Cardiac Cath - n/a Pacemaker/ICD device last checked: n/a Spinal Cord Stimulator: n/a  Sleep Study - n/a CPAP -   Fasting Blood Sugar - n/a Checks Blood Sugar _____ times a day  Blood Thinner Instructions: n/a Aspirin Instructions: Last Dose:  Activity level: Can go up a flight of stairs and perform activities of daily living without stopping and without symptoms of chest pain or shortness of breath.       Anesthesia review:   Patient denies shortness of breath, fever, cough and chest pain at PAT appointment   Patient verbalized understanding of instructions that were given to them at the PAT appointment. Patient was also instructed that they will need to review over the PAT instructions again at home before surgery.

## 2021-08-17 ENCOUNTER — Encounter (HOSPITAL_COMMUNITY)
Admission: RE | Admit: 2021-08-17 | Discharge: 2021-08-17 | Disposition: A | Discharge status: Home / Self Care | Payer: 59 | Source: Ambulatory Visit | Attending: Surgery | Admitting: Surgery

## 2021-08-17 ENCOUNTER — Encounter (HOSPITAL_COMMUNITY): Payer: Self-pay

## 2021-08-17 VITALS — BP 152/87 | HR 79 | Temp 98.4°F | Resp 16 | Ht 73.0 in | Wt 183.2 lb

## 2021-08-17 DIAGNOSIS — Z01818 Encounter for other preprocedural examination: Secondary | ICD-10-CM | POA: Insufficient documentation

## 2021-08-17 HISTORY — DX: Gastro-esophageal reflux disease without esophagitis: K21.9

## 2021-08-17 LAB — CBC
HCT: 37.8 % — ABNORMAL LOW (ref 39.0–52.0)
Hemoglobin: 11 g/dL — ABNORMAL LOW (ref 13.0–17.0)
MCH: 23.6 pg — ABNORMAL LOW (ref 26.0–34.0)
MCHC: 29.1 g/dL — ABNORMAL LOW (ref 30.0–36.0)
MCV: 80.9 fL (ref 80.0–100.0)
Platelets: 364 10*3/uL (ref 150–400)
RBC: 4.67 MIL/uL (ref 4.22–5.81)
RDW: 17.3 % — ABNORMAL HIGH (ref 11.5–15.5)
WBC: 3.6 10*3/uL — ABNORMAL LOW (ref 4.0–10.5)
nRBC: 0 % (ref 0.0–0.2)

## 2021-08-29 ENCOUNTER — Other Ambulatory Visit: Payer: Self-pay | Admitting: Surgery

## 2021-08-29 LAB — SARS CORONAVIRUS 2 (TAT 6-24 HRS): SARS Coronavirus 2: NEGATIVE

## 2021-08-31 ENCOUNTER — Other Ambulatory Visit: Payer: Self-pay

## 2021-08-31 ENCOUNTER — Encounter (HOSPITAL_COMMUNITY): Payer: Self-pay | Admitting: Surgery

## 2021-08-31 ENCOUNTER — Inpatient Hospital Stay (HOSPITAL_COMMUNITY)
Admission: RE | Admit: 2021-08-31 | Discharge: 2021-09-03 | DRG: 330 | Disposition: A | Discharge status: Home / Self Care | Payer: 59 | Attending: Surgery | Admitting: Surgery

## 2021-08-31 ENCOUNTER — Inpatient Hospital Stay (HOSPITAL_COMMUNITY): Payer: 59 | Admitting: Anesthesiology

## 2021-08-31 ENCOUNTER — Encounter (HOSPITAL_COMMUNITY)
Admission: RE | Disposition: A | Discharge status: Home / Self Care | Payer: Self-pay | Source: Home / Self Care | Attending: Surgery

## 2021-08-31 DIAGNOSIS — Z9049 Acquired absence of other specified parts of digestive tract: Secondary | ICD-10-CM

## 2021-08-31 DIAGNOSIS — C19 Malignant neoplasm of rectosigmoid junction: Principal | ICD-10-CM | POA: Diagnosis present

## 2021-08-31 DIAGNOSIS — Z20822 Contact with and (suspected) exposure to covid-19: Secondary | ICD-10-CM | POA: Diagnosis present

## 2021-08-31 DIAGNOSIS — D509 Iron deficiency anemia, unspecified: Secondary | ICD-10-CM | POA: Diagnosis present

## 2021-08-31 DIAGNOSIS — Z01818 Encounter for other preprocedural examination: Secondary | ICD-10-CM

## 2021-08-31 DIAGNOSIS — C772 Secondary and unspecified malignant neoplasm of intra-abdominal lymph nodes: Secondary | ICD-10-CM | POA: Diagnosis present

## 2021-08-31 DIAGNOSIS — K219 Gastro-esophageal reflux disease without esophagitis: Secondary | ICD-10-CM | POA: Diagnosis present

## 2021-08-31 DIAGNOSIS — D62 Acute posthemorrhagic anemia: Secondary | ICD-10-CM | POA: Diagnosis not present

## 2021-08-31 HISTORY — PX: FLEXIBLE SIGMOIDOSCOPY: SHX5431

## 2021-08-31 LAB — TYPE AND SCREEN
ABO/RH(D): O POS
Antibody Screen: NEGATIVE

## 2021-08-31 LAB — ABO/RH: ABO/RH(D): O POS

## 2021-08-31 SURGERY — COLECTOMY, SIGMOID, ROBOT-ASSISTED
Anesthesia: General | Site: Abdomen

## 2021-08-31 MED ORDER — ACETAMINOPHEN 500 MG PO TABS: 1000.0000 mg | ORAL_TABLET | ORAL | Status: DC

## 2021-08-31 MED ORDER — SPY AGENT GREEN - (INDOCYANINE FOR INJECTION)
INTRAMUSCULAR | Status: DC | PRN
  Administered 2021-08-31: 3 mL via INTRAVENOUS

## 2021-08-31 MED ORDER — LIP MEDEX EX OINT
TOPICAL_OINTMENT | CUTANEOUS | Status: AC
  Administered 2021-08-31: 1
  Filled 2021-08-31: qty 7

## 2021-08-31 MED ORDER — BUPIVACAINE-EPINEPHRINE (PF) 0.25% -1:200000 IJ SOLN
INTRAMUSCULAR | Status: DC | PRN
  Administered 2021-08-31: 30 mL

## 2021-08-31 MED ORDER — HYDROMORPHONE HCL 2 MG/ML IJ SOLN
INTRAMUSCULAR | Status: AC
  Filled 2021-08-31: qty 1

## 2021-08-31 MED ORDER — BUPIVACAINE LIPOSOME 1.3 % IJ SUSP
INTRAMUSCULAR | Status: AC
  Filled 2021-08-31: qty 20

## 2021-08-31 MED ORDER — HYDROMORPHONE HCL 1 MG/ML IJ SOLN
0.5000 mg | INTRAMUSCULAR | Status: DC | PRN
  Administered 2021-09-01: 0.5 mg via INTRAVENOUS
  Filled 2021-08-31: qty 0.5

## 2021-08-31 MED ORDER — ONDANSETRON HCL 4 MG/2ML IJ SOLN: 4.0000 mg | Freq: Four times a day (QID) | INTRAMUSCULAR | Status: DC | PRN

## 2021-08-31 MED ORDER — 0.9 % SODIUM CHLORIDE (POUR BTL) OPTIME
TOPICAL | Status: DC | PRN
  Administered 2021-08-31: 2000 mL

## 2021-08-31 MED ORDER — BUPIVACAINE LIPOSOME 1.3 % IJ SUSP: 20.0000 mL | Freq: Once | INTRAMUSCULAR | Status: DC

## 2021-08-31 MED ORDER — CHLORHEXIDINE GLUCONATE CLOTH 2 % EX PADS: 6.0000 | MEDICATED_PAD | Freq: Once | CUTANEOUS | Status: DC

## 2021-08-31 MED ORDER — ALVIMOPAN 12 MG PO CAPS
12.0000 mg | ORAL_CAPSULE | ORAL | Status: AC
  Administered 2021-08-31: 12 mg via ORAL
  Filled 2021-08-31: qty 1

## 2021-08-31 MED ORDER — SUFENTANIL CITRATE 50 MCG/ML IV SOLN
INTRAVENOUS | Status: DC | PRN
  Administered 2021-08-31 (×3): 10 ug via INTRAVENOUS
  Administered 2021-08-31: 5 ug via INTRAVENOUS
  Administered 2021-08-31 (×4): 10 ug via INTRAVENOUS

## 2021-08-31 MED ORDER — SIMETHICONE 80 MG PO CHEW: 40.0000 mg | CHEWABLE_TABLET | Freq: Four times a day (QID) | ORAL | Status: DC | PRN

## 2021-08-31 MED ORDER — PROPOFOL 10 MG/ML IV BOLUS
INTRAVENOUS | Status: AC
  Filled 2021-08-31: qty 20

## 2021-08-31 MED ORDER — TRAMADOL HCL 50 MG PO TABS
50.0000 mg | ORAL_TABLET | Freq: Four times a day (QID) | ORAL | Status: DC | PRN
  Administered 2021-08-31 – 2021-09-02 (×5): 50 mg via ORAL
  Filled 2021-08-31 (×5): qty 1

## 2021-08-31 MED ORDER — DEXAMETHASONE SODIUM PHOSPHATE 10 MG/ML IJ SOLN
INTRAMUSCULAR | Status: AC
  Filled 2021-08-31: qty 1

## 2021-08-31 MED ORDER — SUFENTANIL CITRATE 50 MCG/ML IV SOLN
INTRAVENOUS | Status: AC
  Filled 2021-08-31: qty 1

## 2021-08-31 MED ORDER — HYDROMORPHONE HCL 1 MG/ML IJ SOLN
INTRAMUSCULAR | Status: DC | PRN
  Administered 2021-08-31: 1 mg via INTRAVENOUS

## 2021-08-31 MED ORDER — HYDRALAZINE HCL 20 MG/ML IJ SOLN: 10.0000 mg | INTRAMUSCULAR | Status: DC | PRN

## 2021-08-31 MED ORDER — ENSURE SURGERY PO LIQD
237.0000 mL | Freq: Two times a day (BID) | ORAL | Status: DC
  Administered 2021-09-01 – 2021-09-03 (×3): 237 mL via ORAL

## 2021-08-31 MED ORDER — BUPIVACAINE-EPINEPHRINE (PF) 0.25% -1:200000 IJ SOLN
INTRAMUSCULAR | Status: AC
  Filled 2021-08-31: qty 30

## 2021-08-31 MED ORDER — ORAL CARE MOUTH RINSE: 15.0000 mL | Freq: Once | OROMUCOSAL | Status: AC

## 2021-08-31 MED ORDER — VITAMIN B-12 1000 MCG PO TABS
1000.0000 ug | ORAL_TABLET | Freq: Every day | ORAL | Status: DC
  Administered 2021-09-01 – 2021-09-03 (×3): 1000 ug via ORAL
  Filled 2021-08-31 (×3): qty 1

## 2021-08-31 MED ORDER — HEPARIN SODIUM (PORCINE) 5000 UNIT/ML IJ SOLN
5000.0000 [IU] | Freq: Three times a day (TID) | INTRAMUSCULAR | Status: DC
  Administered 2021-09-01 – 2021-09-03 (×8): 5000 [IU] via SUBCUTANEOUS
  Filled 2021-08-31 (×9): qty 1

## 2021-08-31 MED ORDER — ENSURE PRE-SURGERY PO LIQD
296.0000 mL | Freq: Once | ORAL | Status: DC
  Filled 2021-08-31: qty 296

## 2021-08-31 MED ORDER — FERROUS SULFATE 325 (65 FE) MG PO TABS
325.0000 mg | ORAL_TABLET | Freq: Two times a day (BID) | ORAL | Status: DC
  Administered 2021-09-01 – 2021-09-03 (×5): 325 mg via ORAL
  Filled 2021-08-31 (×5): qty 1

## 2021-08-31 MED ORDER — KETAMINE HCL 10 MG/ML IJ SOLN
INTRAMUSCULAR | Status: DC | PRN
  Administered 2021-08-31: 40 mg via INTRAVENOUS
  Administered 2021-08-31: 10 mg via INTRAVENOUS

## 2021-08-31 MED ORDER — AMISULPRIDE (ANTIEMETIC) 5 MG/2ML IV SOLN: 10.0000 mg | Freq: Once | INTRAVENOUS | Status: DC | PRN

## 2021-08-31 MED ORDER — ALVIMOPAN 12 MG PO CAPS: 12.0000 mg | ORAL_CAPSULE | Freq: Two times a day (BID) | ORAL | Status: DC

## 2021-08-31 MED ORDER — IBUPROFEN 400 MG PO TABS
600.0000 mg | ORAL_TABLET | Freq: Four times a day (QID) | ORAL | Status: DC | PRN
  Administered 2021-09-02: 600 mg via ORAL
  Filled 2021-08-31: qty 1

## 2021-08-31 MED ORDER — LACTATED RINGERS IV SOLN
INTRAVENOUS | Status: DC
Start: 2021-08-31 — End: 2021-08-31

## 2021-08-31 MED ORDER — ALUM & MAG HYDROXIDE-SIMETH 200-200-20 MG/5ML PO SUSP: 30.0000 mL | Freq: Four times a day (QID) | ORAL | Status: DC | PRN

## 2021-08-31 MED ORDER — ONDANSETRON HCL 4 MG/2ML IJ SOLN
INTRAMUSCULAR | Status: DC | PRN
  Administered 2021-08-31: 4 mg via INTRAVENOUS

## 2021-08-31 MED ORDER — LABETALOL HCL 5 MG/ML IV SOLN
INTRAVENOUS | Status: DC | PRN
  Administered 2021-08-31 (×2): 2.5 mg via INTRAVENOUS

## 2021-08-31 MED ORDER — ONDANSETRON HCL 4 MG/2ML IJ SOLN
INTRAMUSCULAR | Status: AC
  Filled 2021-08-31: qty 2

## 2021-08-31 MED ORDER — FENTANYL CITRATE PF 50 MCG/ML IJ SOSY: 25.0000 ug | PREFILLED_SYRINGE | INTRAMUSCULAR | Status: DC | PRN

## 2021-08-31 MED ORDER — SODIUM CHLORIDE (PF) 0.9 % IJ SOLN
INTRAMUSCULAR | Status: AC
  Filled 2021-08-31: qty 20

## 2021-08-31 MED ORDER — SUGAMMADEX SODIUM 500 MG/5ML IV SOLN
INTRAVENOUS | Status: DC | PRN
  Administered 2021-08-31: 300 mg via INTRAVENOUS

## 2021-08-31 MED ORDER — CHLORHEXIDINE GLUCONATE 0.12 % MT SOLN
15.0000 mL | Freq: Once | OROMUCOSAL | Status: AC
  Administered 2021-08-31: 15 mL via OROMUCOSAL

## 2021-08-31 MED ORDER — DIPHENHYDRAMINE HCL 50 MG/ML IJ SOLN: 12.5000 mg | Freq: Four times a day (QID) | INTRAMUSCULAR | Status: DC | PRN

## 2021-08-31 MED ORDER — PROMETHAZINE HCL 25 MG/ML IJ SOLN: 6.2500 mg | INTRAMUSCULAR | Status: DC | PRN

## 2021-08-31 MED ORDER — LACTATED RINGERS IR SOLN
Status: DC | PRN
  Administered 2021-08-31: 1000 mL

## 2021-08-31 MED ORDER — ACETAMINOPHEN 500 MG PO TABS
1000.0000 mg | ORAL_TABLET | Freq: Once | ORAL | Status: AC
  Administered 2021-08-31: 1000 mg via ORAL
  Filled 2021-08-31: qty 2

## 2021-08-31 MED ORDER — OXYCODONE HCL 5 MG PO TABS: 5.0000 mg | ORAL_TABLET | Freq: Once | ORAL | Status: DC | PRN

## 2021-08-31 MED ORDER — KETAMINE HCL 10 MG/ML IJ SOLN
INTRAMUSCULAR | Status: AC
  Filled 2021-08-31: qty 1

## 2021-08-31 MED ORDER — PROPOFOL 10 MG/ML IV BOLUS
INTRAVENOUS | Status: DC | PRN
  Administered 2021-08-31: 170 mg via INTRAVENOUS
  Administered 2021-08-31 (×3): 20 mg via INTRAVENOUS
  Administered 2021-08-31: 30 mg via INTRAVENOUS

## 2021-08-31 MED ORDER — DEXAMETHASONE SODIUM PHOSPHATE 10 MG/ML IJ SOLN
INTRAMUSCULAR | Status: DC | PRN
  Administered 2021-08-31: 10 mg via INTRAVENOUS

## 2021-08-31 MED ORDER — LIDOCAINE 2% (20 MG/ML) 5 ML SYRINGE
INTRAMUSCULAR | Status: DC | PRN
  Administered 2021-08-31: 75 mg via INTRAVENOUS

## 2021-08-31 MED ORDER — LIDOCAINE 20MG/ML (2%) 15 ML SYRINGE OPTIME
INTRAMUSCULAR | Status: DC | PRN
  Administered 2021-08-31: 1.5 mg/kg/h via INTRAVENOUS
  Administered 2021-08-31: 1 mg via INTRAVENOUS

## 2021-08-31 MED ORDER — OXYCODONE HCL 5 MG/5ML PO SOLN: 5.0000 mg | Freq: Once | ORAL | Status: DC | PRN

## 2021-08-31 MED ORDER — ENSURE PRE-SURGERY PO LIQD
592.0000 mL | Freq: Once | ORAL | Status: DC
  Filled 2021-08-31: qty 592

## 2021-08-31 MED ORDER — MIDAZOLAM HCL 5 MG/5ML IJ SOLN
INTRAMUSCULAR | Status: DC | PRN
  Administered 2021-08-31: 2 mg via INTRAVENOUS

## 2021-08-31 MED ORDER — MIDAZOLAM HCL 2 MG/2ML IJ SOLN
INTRAMUSCULAR | Status: AC
  Filled 2021-08-31: qty 2

## 2021-08-31 MED ORDER — LACTATED RINGERS IV SOLN: INTRAVENOUS | Status: DC

## 2021-08-31 MED ORDER — DIPHENHYDRAMINE HCL 12.5 MG/5ML PO ELIX: 12.5000 mg | ORAL_SOLUTION | Freq: Four times a day (QID) | ORAL | Status: DC | PRN

## 2021-08-31 MED ORDER — HEPARIN SODIUM (PORCINE) 5000 UNIT/ML IJ SOLN
5000.0000 [IU] | Freq: Once | INTRAMUSCULAR | Status: AC
  Administered 2021-08-31: 5000 [IU] via SUBCUTANEOUS
  Filled 2021-08-31: qty 1

## 2021-08-31 MED ORDER — TRAMADOL HCL 50 MG PO TABS: 50.0000 mg | ORAL_TABLET | Freq: Four times a day (QID) | ORAL | 0 refills | Status: AC | PRN

## 2021-08-31 MED ORDER — ONDANSETRON HCL 4 MG PO TABS: 4.0000 mg | ORAL_TABLET | Freq: Four times a day (QID) | ORAL | Status: DC | PRN

## 2021-08-31 MED ORDER — SODIUM CHLORIDE 0.9 % IV SOLN
2.0000 g | INTRAVENOUS | Status: AC
  Administered 2021-08-31: 2 g via INTRAVENOUS
  Filled 2021-08-31: qty 2

## 2021-08-31 MED ORDER — BUPIVACAINE LIPOSOME 1.3 % IJ SUSP
INTRAMUSCULAR | Status: DC | PRN
  Administered 2021-08-31: 20 mL

## 2021-08-31 MED ORDER — LABETALOL HCL 5 MG/ML IV SOLN
INTRAVENOUS | Status: AC
  Filled 2021-08-31: qty 4

## 2021-08-31 MED ORDER — ACETAMINOPHEN 500 MG PO TABS
1000.0000 mg | ORAL_TABLET | Freq: Four times a day (QID) | ORAL | Status: DC
  Administered 2021-08-31 – 2021-09-03 (×10): 1000 mg via ORAL
  Filled 2021-08-31 (×11): qty 2

## 2021-08-31 MED ORDER — KETOROLAC TROMETHAMINE 30 MG/ML IJ SOLN: 30.0000 mg | Freq: Once | INTRAMUSCULAR | Status: DC | PRN

## 2021-08-31 MED ORDER — ROCURONIUM BROMIDE 10 MG/ML (PF) SYRINGE
PREFILLED_SYRINGE | INTRAVENOUS | Status: DC | PRN
  Administered 2021-08-31 (×2): 20 mg via INTRAVENOUS
  Administered 2021-08-31: 60 mg via INTRAVENOUS

## 2021-08-31 SURGICAL SUPPLY — 118 items
APL PRP STRL LF DISP 70% ISPRP (MISCELLANEOUS) ×2
APPLIER CLIP 5 13 M/L LIGAMAX5 (MISCELLANEOUS)
APPLIER CLIP ROT 10 11.4 M/L (STAPLE)
APR CLP MED LRG 11.4X10 (STAPLE)
APR CLP MED LRG 5 ANG JAW (MISCELLANEOUS)
BAG COUNTER SPONGE SURGICOUNT (BAG) IMPLANT
BAG SPNG CNTER NS LX DISP (BAG)
BLADE EXTENDED COATED 6.5IN (ELECTRODE) ×3 IMPLANT
CANNULA REDUC XI 12-8 STAPL (CANNULA) ×3
CANNULA REDUCER 12-8 DVNC XI (CANNULA) ×2 IMPLANT
CELLS DAT CNTRL 66122 CELL SVR (MISCELLANEOUS) IMPLANT
CHLORAPREP W/TINT 26 (MISCELLANEOUS) ×3 IMPLANT
CLIP APPLIE 5 13 M/L LIGAMAX5 (MISCELLANEOUS) IMPLANT
CLIP APPLIE ROT 10 11.4 M/L (STAPLE) IMPLANT
CLIP LIGATING HEMO O LOK GREEN (MISCELLANEOUS) IMPLANT
COVER SURGICAL LIGHT HANDLE (MISCELLANEOUS) ×6 IMPLANT
COVER TIP SHEARS 8 DVNC (MISCELLANEOUS) ×2 IMPLANT
COVER TIP SHEARS 8MM DA VINCI (MISCELLANEOUS) ×3
DECANTER SPIKE VIAL GLASS SM (MISCELLANEOUS) ×3 IMPLANT
DEFOGGER SCOPE WARMER CLEARIFY (MISCELLANEOUS) ×3 IMPLANT
DEVICE TROCAR PUNCTURE CLOSURE (ENDOMECHANICALS) IMPLANT
DRAIN CHANNEL 19F RND (DRAIN) ×3 IMPLANT
DRAPE ARM DVNC X/XI (DISPOSABLE) ×8 IMPLANT
DRAPE COLUMN DVNC XI (DISPOSABLE) ×2 IMPLANT
DRAPE DA VINCI XI ARM (DISPOSABLE) ×12
DRAPE DA VINCI XI COLUMN (DISPOSABLE) ×3
DRAPE SURG IRRIG POUCH 19X23 (DRAPES) ×3 IMPLANT
DRSG OPSITE POSTOP 4X10 (GAUZE/BANDAGES/DRESSINGS) IMPLANT
DRSG OPSITE POSTOP 4X6 (GAUZE/BANDAGES/DRESSINGS) ×1 IMPLANT
DRSG OPSITE POSTOP 4X8 (GAUZE/BANDAGES/DRESSINGS) IMPLANT
DRSG TEGADERM 2-3/8X2-3/4 SM (GAUZE/BANDAGES/DRESSINGS) ×10 IMPLANT
DRSG TEGADERM 4X4.75 (GAUZE/BANDAGES/DRESSINGS) ×2 IMPLANT
ELECT REM PT RETURN 15FT ADLT (MISCELLANEOUS) ×3 IMPLANT
ENDOLOOP SUT PDS II  0 18 (SUTURE)
ENDOLOOP SUT PDS II 0 18 (SUTURE) IMPLANT
EVACUATOR SILICONE 100CC (DRAIN) ×3 IMPLANT
GAUZE SPONGE 2X2 8PLY STRL LF (GAUZE/BANDAGES/DRESSINGS) ×2 IMPLANT
GAUZE SPONGE 4X4 12PLY STRL (GAUZE/BANDAGES/DRESSINGS) IMPLANT
GLOVE SURG ENC MOIS LTX SZ7.5 (GLOVE) ×9 IMPLANT
GLOVE SURG UNDER LTX SZ8 (GLOVE) ×9 IMPLANT
GOWN STRL REUS W/TWL XL LVL3 (GOWN DISPOSABLE) ×15 IMPLANT
GRASPER SUT TROCAR 14GX15 (MISCELLANEOUS) IMPLANT
HOLDER FOLEY CATH W/STRAP (MISCELLANEOUS) ×3 IMPLANT
IRRIG SUCT STRYKERFLOW 2 WTIP (MISCELLANEOUS) ×3
IRRIGATION SUCT STRKRFLW 2 WTP (MISCELLANEOUS) ×2 IMPLANT
KIT PROCEDURE DA VINCI SI (MISCELLANEOUS) ×3
KIT PROCEDURE DVNC SI (MISCELLANEOUS) IMPLANT
KIT TURNOVER KIT A (KITS) ×1 IMPLANT
NDL INSUFFLATION 14GA 120MM (NEEDLE) ×2 IMPLANT
NEEDLE INSUFFLATION 14GA 120MM (NEEDLE) ×3 IMPLANT
PACK CARDIOVASCULAR III (CUSTOM PROCEDURE TRAY) ×3 IMPLANT
PACK COLON (CUSTOM PROCEDURE TRAY) ×3 IMPLANT
PAD POSITIONING PINK XL (MISCELLANEOUS) ×3 IMPLANT
PENCIL SMOKE EVACUATOR (MISCELLANEOUS) IMPLANT
PROTECTOR NERVE ULNAR (MISCELLANEOUS) ×6 IMPLANT
RELOAD STAPLE 45 3.5 BLU DVNC (STAPLE) IMPLANT
RELOAD STAPLE 45 4.3 GRN DVNC (STAPLE) IMPLANT
RELOAD STAPLE 60 3.5 BLU DVNC (STAPLE) IMPLANT
RELOAD STAPLE 60 4.3 GRN DVNC (STAPLE) IMPLANT
RELOAD STAPLER 3.5X45 BLU DVNC (STAPLE) IMPLANT
RELOAD STAPLER 3.5X60 BLU DVNC (STAPLE) IMPLANT
RELOAD STAPLER 4.3X45 GRN DVNC (STAPLE) IMPLANT
RELOAD STAPLER 4.3X60 GRN DVNC (STAPLE) IMPLANT
RETRACTOR WND ALEXIS 18 MED (MISCELLANEOUS) IMPLANT
RTRCTR WOUND ALEXIS 18CM MED (MISCELLANEOUS)
SCISSORS LAP 5X35 DISP (ENDOMECHANICALS) IMPLANT
SEAL CANN UNIV 5-8 DVNC XI (MISCELLANEOUS) ×8 IMPLANT
SEAL XI 5MM-8MM UNIVERSAL (MISCELLANEOUS) ×12
SEALER VESSEL DA VINCI XI (MISCELLANEOUS) ×3
SEALER VESSEL EXT DVNC XI (MISCELLANEOUS) ×2 IMPLANT
SLEEVE ADV FIXATION 5X100MM (TROCAR) IMPLANT
SOLUTION ELECTROLUBE (MISCELLANEOUS) ×2 IMPLANT
SPONGE GAUZE 2X2 STER 10/PKG (GAUZE/BANDAGES/DRESSINGS)
STAPLER CANNULA SEAL DVNC XI (STAPLE) ×2 IMPLANT
STAPLER CANNULA SEAL XI (STAPLE) ×3
STAPLER ECHELON POWER CIR 29 (STAPLE) IMPLANT
STAPLER ECHELON POWER CIR 31 (STAPLE) IMPLANT
STAPLER RELOAD 3.5X45 BLU DVNC (STAPLE)
STAPLER RELOAD 3.5X45 BLUE (STAPLE)
STAPLER RELOAD 3.5X60 BLU DVNC (STAPLE)
STAPLER RELOAD 3.5X60 BLUE (STAPLE)
STAPLER RELOAD 4.3X45 GREEN (STAPLE)
STAPLER RELOAD 4.3X45 GRN DVNC (STAPLE)
STAPLER RELOAD 4.3X60 GREEN (STAPLE)
STAPLER RELOAD 4.3X60 GRN DVNC (STAPLE)
STAPLER SHEATH (SHEATH) ×3
STAPLER SHEATH ENDOWRIST DVNC (SHEATH) ×2 IMPLANT
STOPCOCK 4 WAY LG BORE MALE ST (IV SETS) ×4 IMPLANT
SURGILUBE 2OZ TUBE FLIPTOP (MISCELLANEOUS) ×3 IMPLANT
SUT MNCRL AB 4-0 PS2 18 (SUTURE) ×3 IMPLANT
SUT PDS AB 1 CT1 27 (SUTURE) ×2 IMPLANT
SUT PDS AB 1 TP1 96 (SUTURE) IMPLANT
SUT PROLENE 0 CT 2 (SUTURE) IMPLANT
SUT PROLENE 2 0 KS (SUTURE) ×3 IMPLANT
SUT PROLENE 2 0 SH DA (SUTURE) IMPLANT
SUT SILK 2 0 (SUTURE)
SUT SILK 2 0 SH CR/8 (SUTURE) IMPLANT
SUT SILK 2-0 18XBRD TIE 12 (SUTURE) IMPLANT
SUT SILK 3 0 (SUTURE) ×3
SUT SILK 3 0 SH CR/8 (SUTURE) ×3 IMPLANT
SUT SILK 3-0 18XBRD TIE 12 (SUTURE) ×2 IMPLANT
SUT V-LOC BARB 180 2/0GR6 GS22 (SUTURE)
SUT VIC AB 3-0 SH 18 (SUTURE) IMPLANT
SUT VIC AB 3-0 SH 27 (SUTURE)
SUT VIC AB 3-0 SH 27XBRD (SUTURE) IMPLANT
SUT VICRYL 0 UR6 27IN ABS (SUTURE) ×3 IMPLANT
SUTURE V-LC BRB 180 2/0GR6GS22 (SUTURE) IMPLANT
SYR 10ML LL (SYRINGE) ×3 IMPLANT
SYS LAPSCP GELPORT 120MM (MISCELLANEOUS)
SYS WOUND ALEXIS 18CM MED (MISCELLANEOUS) ×3
SYSTEM LAPSCP GELPORT 120MM (MISCELLANEOUS) IMPLANT
SYSTEM WOUND ALEXIS 18CM MED (MISCELLANEOUS) ×2 IMPLANT
TAPE UMBILICAL 1/8 X36 TWILL (MISCELLANEOUS) ×2 IMPLANT
TOWEL OR NON WOVEN STRL DISP B (DISPOSABLE) ×3 IMPLANT
TRAY FOLEY MTR SLVR 16FR STAT (SET/KITS/TRAYS/PACK) ×3 IMPLANT
TROCAR ADV FIXATION 5X100MM (TROCAR) ×3 IMPLANT
TUBING CONNECTING 10 (TUBING) ×6 IMPLANT
TUBING INSUFFLATION 10FT LAP (TUBING) ×3 IMPLANT

## 2021-08-31 NOTE — Anesthesia Postprocedure Evaluation (Signed)
Anesthesia Post Note  Patient: Ethan Martin  Procedure(s) Performed: XI ROBOT ASSISTED LOW ANTERIOR RESECTION WITH BILATERAL TAP BLOCK AND INTRAOPERATIVE ASSESSMENT OF PERFUSION USING FIREFLY (Abdomen) [place] SIGMOIDOSCOPY     Patient location during evaluation: PACU Anesthesia Type: General Level of consciousness: awake Pain management: pain level controlled Vital Signs Assessment: post-procedure vital signs reviewed and stable Respiratory status: spontaneous breathing, nonlabored ventilation, respiratory function stable and patient connected to nasal cannula oxygen Cardiovascular status: blood pressure returned to baseline and stable Postop Assessment: no apparent nausea or vomiting Anesthetic complications: no   No notable events documented.  Last Vitals:  Vitals:   08/31/21 1643 08/31/21 1745  BP: (!) 141/79 (!) 143/79  Pulse: 65 69  Resp: 17 17  Temp: 36.9 C (!) 36.3 C  SpO2: 100% 100%    Last Pain:  Vitals:   08/31/21 1745  TempSrc: Oral  PainSc:                  Jumar Greenstreet P Breaker Springer

## 2021-08-31 NOTE — Transfer of Care (Signed)
Immediate Anesthesia Transfer of Care Note  Patient: Ethan Martin  Procedure(s) Performed: XI ROBOT ASSISTED LOW ANTERIOR RESECTION WITH BILATERAL TAP BLOCK AND INTRAOPERATIVE ASSESSMENT OF PERFUSION USING FIREFLY (Abdomen) FLEXIBLE SIGMOIDOSCOPY  Patient Location: PACU  Anesthesia Type:General  Level of Consciousness: awake, drowsy and patient cooperative  Airway & Oxygen Therapy: Patient Spontanous Breathing and Patient connected to face mask oxygen  Post-op Assessment: Report given to RN, Post -op Vital signs reviewed and stable and Patient moving all extremities X 4  Post vital signs: stable  Last Vitals:  Vitals Value Taken Time  BP 149/82 08/31/21 1600  Temp 36.9 C 08/31/21 1512  Pulse 66 08/31/21 1609  Resp 9 08/31/21 1609  SpO2 99 % 08/31/21 1609  Vitals shown include unvalidated device data.  Last Pain:  Vitals:   08/31/21 1600  TempSrc:   PainSc: Asleep      Patients Stated Pain Goal: 4 (16/10/96 0454)  Complications: No notable events documented.

## 2021-08-31 NOTE — Anesthesia Preprocedure Evaluation (Addendum)
Anesthesia Evaluation  Patient identified by MRN, date of birth, ID band Patient awake    Reviewed: Allergy & Precautions, NPO status , Patient's Chart, lab work & pertinent test results  Airway Mallampati: II  TM Distance: >3 FB Neck ROM: Full    Dental no notable dental hx.    Pulmonary neg pulmonary ROS,    Pulmonary exam normal breath sounds clear to auscultation       Cardiovascular negative cardio ROS Normal cardiovascular exam Rhythm:Regular Rate:Normal     Neuro/Psych negative neurological ROS  negative psych ROS   GI/Hepatic Bowel prep,(+)     substance abuse  ,   Endo/Other  negative endocrine ROS  Renal/GU negative Renal ROS     Musculoskeletal  (+) Arthritis ,   Abdominal   Peds  Hematology  (+) anemia ,   Anesthesia Other Findings Sigmoid colon cancer  Reproductive/Obstetrics                           Anesthesia Physical Anesthesia Plan  ASA: 2  Anesthesia Plan: General   Post-op Pain Management:    Induction: Intravenous  PONV Risk Score and Plan: 2 and Ondansetron, Dexamethasone, Midazolam and Treatment may vary due to age or medical condition  Airway Management Planned: Oral ETT  Additional Equipment:   Intra-op Plan:   Post-operative Plan: Extubation in OR  Informed Consent: I have reviewed the patients History and Physical, chart, labs and discussed the procedure including the risks, benefits and alternatives for the proposed anesthesia with the patient or authorized representative who has indicated his/her understanding and acceptance.     Dental advisory given  Plan Discussed with: CRNA  Anesthesia Plan Comments:        Anesthesia Quick Evaluation

## 2021-08-31 NOTE — Op Note (Signed)
PATIENT: Ethan Martin  55 y.o. male  Patient Care Team: Patient, No Pcp Per (Inactive) as PCP - General (General Practice)  PREOP DIAGNOSIS: sigmoid colon cancer  POSTOP DIAGNOSIS: Rectosigmoid colon cancer  PROCEDURE:  Robotic assisted low anterior resection with double stapled colorectal anastomosis Intraoperative assessment of perfusion (ICG) Flexible sigmoidoscopy Bilateral transversus abdominus plane blocks  SURGEON: Sharon Mt. Dema Severin, MD  ASSISTANT: Leighton Ruff, MD  ANESTHESIA: General endotracheal  EBL: 125 mL Total I/O In: 2000 [I.V.:2000] Out: 200 [Urine:75; Blood:125]  DRAINS: None  SPECIMEN: Rectosigmoid colon - open end proximal Distal anastomotic donut  COUNTS: Sponge, needle and instrument counts were reported correct x2  FINDINGS:  Possible history of prior diverticulitis? There were filmy adhesions well away from the disease on the sigmoid consistent in appearance with prior diverticulitis. Tattoo evident. Associated mass involving a hairpin turn of the sigmoid and may be involving the adjacent sigmoid loop. Low anterior resection was necessary to obtain a healthy, uninvolved distal margin that is well perfused.  No obvious distant metastatic disease or peritoneal disease. Normal liver surface. A 29 mm EEA colorectal anastomosis fashioned 18 cm from the anal verge by flexible sigmoidoscopy. At this location, the tenia splayed, it overlaid  the sacral promontory and was where the epiploicae were absent, anatomically the proximal rectum.  NARRATIVE: Informed consent was verified. He was taken to the operating room, placed supine on the operating table and SCD's were applied. General endotracheal anesthesia was induced without difficulty. The patient was then positioned in the lithotomy position with Allen stirrups.  A foley catheter was then placed by nursing under sterile conditions. Pressure points were evaluated and padded. Hair on the abdomen was clipped.  He  was secured to the operating table. The abdomen was then prepped and draped in the standard sterile fashion. Surgical timeout was called indicating the correct patient, procedure, positioning and need for preoperative antibiotics.   An OG tube was placed by anesthesia and confirmed to be to suction.  At Palmer's point, a stab incision was created and the Veress needle was introduced into the peritoneal cavity on the first attempt.  Intraperitoneal location was confirmed by the aspiration and saline drop test.  Pneumoperitoneum was established to a maximum pressure of 15 mmHg using CO2.  Following this, the abdomen was marked for planned trocar sites.  Just to the right and cephalad to the umbilicus, an 8 mm incision was created and an 8 mm blunt tipped robotic trocar was cautiously placed into the peritoneal cavity.  The laparoscope was inserted and demonstrated no evidence of trocar site nor Veress needle site complications.  The Veress needle was removed.  Bilateral transversus abdominis plane blocks were then created using a dilute mixture of Exparel with Marcaine.  3 additional 8 mm robotic trochars were placed under direct visualization roughly in a line extending from the right ASIS towards the left upper quadrant. The bladder was inspected and noted to be at/below the pubic symphysis.  Staying 3 fingerbreadths above the pubic symphysis, an incision was created and the 12 mm robotic trocar inserted directed cephalad into the peritoneal cavity under direct visualization.  An additional 5 mm assist port was placed in the right lateral abdomen under direct visualization.  The abdomen was surveyed and there was tattoo evident in the distal sigmoid.  The liver is normal in appearance.  The peritoneal surfaces of the abdomen are without any evident disease.  He was positioned in Trendelenburg with some left side up.  Small  bowel was carefully retracted out of the pelvis.  The robot was then docked and I went to  the console.   The sigmoid colon was readily identified.  There is a hairpin turn that this mass appears to involve where sigmoid is adherent to itself.  There also removed from this location chronic appearing inflammatory type adhesions that are somewhat filmy suggestive of prior diverticulitis.  There is no evident tumor outside the epicenter of the mass.  Attachments of the sigmoid colon were taken down from the intersigmoid fossa.  The rectosigmoid colon was grasped and elevated anteriorly.  Beginning with a medial to lateral approach, the peritoneum overlying the presacral space was carefully incised.  The TME plane was readily gained working in a plane between the fascia propria of the rectum and the presacral fascia.  Hypogastric nerves were seen going along the the presacral fascia and were protected free of injury.  Working more proximally, the mesorectum and sigmoid mesentery were carefully mobilized off of the peritoneum.  The left ureter was identified and protected free of injury.  The left gonadal vessels were identified and protected.  These were both swept "down."  The superior hemorrhoidal and IMA pedicles were identified. Further mesocolon was mobilized proximally staying in this plane between the retroperitoneum proper and the mesocolon. Attention was then turned to the lateral portion of dissection.  The sigmoid colon was retracted to the right.  The sigmoid colon was fully mobilized and working proximal to the diseased segment of colon, the descending colon was mobilized by incising the Alwyn Cordner line of Toldt.  This was done all the way up to the level of the splenic flexure.  The associated mesocolon was also mobilized medially.  The left ureter again was confirmed to be well away for plan dissection and well away from the vasculature which had been dissected medially.  The rectosigmoid colon was elevated anteriorly. The left ureter was re-identified. The IMA was clear of this.  He does appear  to have enlarged lymph nodes within the IMA territory but these are within our planned point of resection.  There also inflammatory type adhesions around the IMA pedicle that are carefully freed.  The IMA was then divided with the vessel sealer. The stump was inspected and noted to be completely hemostatic with a good seal.  The mesentery was divided out to the point of planned proximal division.  This included ligation of the IMV to again assist in facilitating descending colon region of the pelvis.  Working more distally, the rectum was identified where the tinea had splayed and there were loss of appendices epiploica.  This also corresponded to a location overlying the sacral promontory.  Anatomically, this clearly represents the proximal rectum.  The mesentery out to this level was then cleared using the vessel sealer. The distal point of transection on the proximal rectum was identified.   A 60 mm green load robotic stapler was then placed through the 12 mm port and introduced into the peritoneal cavity.  The rectum was divided with 1 firing of the stapler.  The stump was intact and healthy in appearance.   Attention was turned to performing a perfusion test. ICG was administered by anesthesia and at the level of the cleared mesentery proximally, there was rapid uptake of the tracer.  The rectum was also well perfused in appearance.  There was a visible pulse in the mesentery out to the level of the cleared colon at the level of the proximal sigmoid/descending  colon junction.  This colon is also supple and healthy in appearance without any thickening.  This reached into the pelvis without any difficulty and remained in that location without any tension. A locking grasper was then placed on the sigmoid staple line.   Attention was turned to the extracorporeal portion of the procedure.  The robot was undocked.  I scrubbed back in.  Using the 12 mm trocar site, a Pfannenstiel incision was created and  incorporated the fascial opening through the 12 mm port site.  The rectus fascia was incised and then elevated.  The rectus muscle was mobilized free of the overlying fascia.  The peritoneum was incised in the midline well above the location of the bladder.  An [place] wound protector was placed.  Towels were placed around the field.  The divided colon was passed through the wound protector.  The point of proximal division was identified and was again on a healthy segment of supple colon with a palpable pulse in the mesentery. This was pink in color.  A pursestring device was applied.  A 2-0 Prolene on a Keith needle was passed.  The colon was divided and passed off with the open end being proximal.  EEA sizers were then introduced and a 29 mm EEA selected.  "Belt loops" consisting of 3-0 silk were placed around the pursestring suture line.  The anvil was placed and the pursestring tied.  A small amount of fat was cleared from the planned anastomosis and no diverticula were apparent within this.  This was placed back into the abdomen and a cap placed over the wound protector port site.  Pneumoperitoneum was reestablished.  I then went below to pass the stapler.  My partner remained above.  EEA sizers were cautiously passed trans-anally under direct visualization.  The stapler was passed and the spike deployed just anterior to the staple line.  The components were then mated.  Orientation was confirmed such that there is no twisting of the colon nor small bowel underneath the mesenteric defect. Care was taken to ensure no other structures were incorporated within this either.  The stapler was then closed, held, and fired. This was then removed. The donuts were inspected and noted to be complete.  The colon proximal to the anastomosis was then gently occluded. The pelvis was filled with sterile irrigation. Under direct visualization, I passed a flexible sigmoidoscope.  The anastomosis was under water.  With good  distention of the anastomosis there was no air leak. The anastomosis pink in appearance.  This is located at 18 cm from the anal verge by flexible sigmoidoscopy at the upper rectum.  It is hemostatic.  Additionally, looking from above, there is no tension on the colon or mesentery.  Sigmoidoscope was withdrawn.  Irrigation was evacuated from the pelvis.  The abdomen and pelvis are surveyed and noted to be completely hemostatic without any apparent injury.  Under direct visualization, all trochars are removed.  The [place] wound protector was removed.  Gowns/gloves are changed and a fresh set of clean instruments utilized. Additional sterile drapes were placed around the field.   Sponge, needle, and instrument counts were reported correct.  The Pfannenstiel peritoneum was closed with a running 0 Vicryl suture.  The rectus fascia was then closed using 2 running #1 PDS sutures.  The fascia was then palpated and noted to be completely closed.  Additional anesthetic was infiltrated at the Pfannenstiel site.  Sponge, needle, and instrument counts were then again reported correct.  4-0 Monocryl subcuticular suture was used to close the skin of all incision sites.  Dermabond was placed over all incisions.  A honeycomb dressing placed over the Pfannenstiel as well.   He is taken out of lithotomy, awakened from anesthesia, extubated, and transferred to a stretcher for transport to PACU in satisfactory condition having tolerated the procedure well.

## 2021-08-31 NOTE — Discharge Instructions (Signed)
POST OP INSTRUCTIONS AFTER COLON SURGERY  DIET: Be sure to include lots of fluids daily to stay hydrated - 64oz of water per day (8, 8 oz glasses).  Avoid fast food or heavy meals for the first couple of weeks as your are more likely to get nauseated. Avoid raw/uncooked fruits or vegetables for the first 4 weeks (its ok to have these if they are blended into smoothie form). If you have fruits/vegetables, make sure they are cooked until soft enough to mash on the roof of your mouth and chew your food well. Otherwise, diet as tolerated.  Take your usually prescribed home medications unless otherwise directed.  PAIN CONTROL: Pain is best controlled by a usual combination of three different methods TOGETHER: Ice/Heat Over the counter pain medication Prescription pain medication Most patients will experience some swelling and bruising around the surgical site.  Ice packs or heating pads (30-60 minutes up to 6 times a day) will help. Some people prefer to use ice alone, heat alone, alternating between ice & heat.  Experiment to what works for you.  Swelling and bruising can take several weeks to resolve.   It is helpful to take an over-the-counter pain medication regularly for the first few weeks: Ibuprofen (Motrin/Advil) - 200mg tabs - take 3 tabs (600mg) every 6 hours as needed for pain (unless you have been directed previously to avoid NSAIDs/ibuprofen) Acetaminophen (Tylenol) - you may take 650mg every 6 hours as needed. You can take this with motrin as they act differently on the body. If you are taking a narcotic pain medication that has acetaminophen in it, do not take over the counter tylenol at the same time. NOTE: You may take both of these medications together - most patients  find it most helpful when alternating between the two (i.e. Ibuprofen at 6am, tylenol at 9am, ibuprofen at 12pm ...) A  prescription for pain medication should be given to you upon discharge.  Take your pain medication as  prescribed if your pain is not adequatly controlled with the over-the-counter pain reliefs mentioned above.  Avoid getting constipated.  Between the surgery and the pain medications, it is common to experience some constipation.  Increasing fluid intake and taking a fiber supplement (such as Metamucil, Citrucel, FiberCon, MiraLax, etc) 1-2 times a day regularly will usually help prevent this problem from occurring.  A mild laxative (prune juice, Milk of Magnesia, MiraLax, etc) should be taken according to package directions if there are no bowel movements after 48 hours.    Dressing: Your incisions are covered in Dermabond which is like sterile superglue for the skin. This will come off on it's own in a couple weeks. It is waterproof and you may bathe normally starting the day after your surgery in a shower. Avoid baths/pools/lakes/oceans until your wounds have fully healed.  ACTIVITIES as tolerated:   Avoid heavy lifting (>10lbs or 1 gallon of milk) for the next 6 weeks. You may resume regular daily activities as tolerated--such as daily self-care, walking, climbing stairs--gradually increasing activities as tolerated.  If you can walk 30 minutes without difficulty, it is safe to try more intense activity such as jogging, treadmill, bicycling, low-impact aerobics.  DO NOT PUSH THROUGH PAIN.  Let pain be your guide: If it hurts to do something, don't do it. You may drive when you are no longer taking prescription pain medication, you can comfortably wear a seatbelt, and you can safely maneuver your car and apply brakes.  FOLLOW UP in our   office Please call [facility] at [phone] to set up an appointment to see your surgeon in the office for a follow-up appointment approximately 2 weeks after your surgery. Make sure that you call for this appointment the day you arrive home to insure a convenient appointment time.  9. If you have disability or family leave forms that need to be completed, you may have  them completed by your primary care physician's office; for return to work instructions, please ask our office staff and they will be happy to assist you in obtaining this documentation   When to call us [phone]: Poor pain control Reactions / problems with new medications (rash/itching, etc)  Fever over 101.5 F (38.5 C) Inability to urinate Nausea/vomiting Worsening swelling or bruising Continued bleeding from incision. Increased pain, redness, or drainage from the incision  The clinic staff is available to answer your questions during regular business hours (8:30am-5pm).  Please don't hesitate to call and ask to speak to one of our nurses for clinical concerns.   A surgeon from [facility] is always on call at the hospitals   If you have a medical emergency, go to the nearest emergency room or call 911.  [facility] [address], [address], [place], [place]  [address] MAIN: [phone] FAX: [phone] [email or web address]  

## 2021-08-31 NOTE — H&P (Signed)
CC: Colon cancer, here today for surgery  HPI: Ethan Martin is an 55 y.o. male with history of iron deficiency anemia, whom was seen in our office as a referral by Dr. [name] for evaluation of newly diagnosed presumably sigmoid colon cancer.  He underwent colonoscopy with Dr. [name] 07/22/2021 that demonstrated a mass presumably in the sigmoid colon at 32 cm. With a slim scope they are not able to traverse this. This was biopsied. Biopsy results returned adenocarcinoma.  CEA 3.2 Hgb 19.6 (07/15/21)  He has been doing reasonably well. He denied any complaints in the office. He denied any abdominal pain. He is not currently taking any stool softeners or laxatives. He will occasionally have a 1 or 2-second episodic sharp pain in his abdomen. He denies any other known abdominal complaints. He does report that he first noticed bright red blood in his stool approximately 1 year ago. He was told it is probably hemorrhoidal. He has had ongoing issues with intermittent bright red blood per rectum as well.  CT CAP 07/28/21 - 1. Circumferential wall thickening of the distal sigmoid colon, in keeping with primary colon malignancy identified by colonoscopy. 2. There is some nodularity in the adjacent mesocolon about the inferior mesenteric artery, as well as prominent subcentimeter lymph nodes, worrisome for early nodal or soft tissue metastatic disease. 3. No evidence of distant metastatic disease in the chest, abdomen, or pelvis.  He has taken his bowel prep with satisfactory result. He denies any changes in his health or health history since we met in the office. Did complete CT since we met, result noted above. He denies any complaints today. States he is ready for surgery.  PMH: iron deficiency anemia  PSH: Denies   FHx: Denies any known family history of colorectal, breast, endometrial or ovarian cancer  Social Hx: Denies use of tobacco/illicit drug. Social EtOH use. He works as a [employer]ar[employer] manager  Past Medical History:  Diagnosis Date   Arthritis    Cataract    GERD (gastroesophageal reflux disease)     Past Surgical History:  Procedure Laterality Date   NOSE SURGERY     WISDOM TOOTH EXTRACTION     1988    Family History  Problem Relation Age of Onset   Healthy Mother    Healthy Father    Colon cancer Neg Hx    Esophageal cancer Neg Hx    Rectal cancer Neg Hx    Stomach cancer Neg Hx     Social:  reports that he has never smoked. He has never used smokeless tobacco. He reports current alcohol use of about 5.0 - 6.0 standard drinks per week. He reports that he does not currently use drugs.  Allergies: No Known Allergies  Medications: I have reviewed the patient's current medications.  No results found for this or any previous visit (from the past 48 hour(s)).  No results found.  ROS - all of the below systems have been reviewed with the patient and positives are indicated with bold text General: chills, fever or night sweats Eyes: blurry vision or double vision ENT: epistaxis or sore throat Allergy/Immunology: itchy/watery eyes or nasal congestion Hematologic/Lymphatic: bleeding problems, blood clots or swollen lymph nodes Endocrine: temperature intolerance or unexpected weight changes Breast: new or changing breast lumps or nipple discharge Resp: cough, shortness of breath, or wheezing CV: chest pain or dyspnea on exertion GI: as per HPI GU: dysuria, trouble voiding, or hematuria MSK: joint pain or joint  stiffness Neuro: TIA or stroke symptoms Derm: pruritus and skin lesion changes Psych: anxiety and depression  PE There were no vitals taken for this visit. Constitutional: NAD; conversant Eyes: Moist conjunctiva; no lid lag; anicteric Lungs: Normal respiratory effort CV: RRR; no pitting edema GI: Abd soft, NT/ND; no palpable hepatosplenomegaly MSK: Normal range of motion of extremities Psychiatric: Appropriate affect; alert  and oriented x3  No results found for this or any previous visit (from the past 48 hour(s)).  No results found.   A/P: Ethan Martin is an 55 y.o. male with hx of iron deficiency anemia here for evaluation of newly diagnosed sigmoid colon cancer, here today for surgery  Cscope 07/20/21 - mass at 32 cm, tattoo'd distally CEA 3.2  -CT CAP shows mass in sigmoid with some nodularity in mesocolon near IMA  -The anatomy and physiology of the GI tract was reviewed with the patient. The pathophysiology of colon cancer was discussed as well with associated pictures. -We have discussed various different treatment options going forward including surgery (the most definitive) to address this - robotic assisted sigmoidectomy, flexible sigmoidoscopy -The planned procedure, material risks (including, but not limited to, pain, bleeding, infection, scarring, need for blood transfusion, damage to surrounding structures- blood vessels/nerves/viscus/organs, damage to ureter, urine leak, leak from anastomosis, need for additional procedures, scenarios where a stoma may be necessary and where it may be permanent, worsening of pre-existing medical conditions, hernia, recurrence, pneumonia, heart attack, stroke, death) benefits and alternatives to surgery were discussed at length. The patient's questions were answered to his satisfaction, he voiced understanding and elected to proceed with surgery. Additionally, we discussed typical postoperative expectations and the recovery process.  Nadeen Landau, MD [facility] Use [email or web address] to contact on call provider

## 2021-08-31 NOTE — Anesthesia Procedure Notes (Signed)
Procedure Name: Intubation Date/Time: 08/31/2021 12:14 PM Performed by: Lissa Morales, CRNA Pre-anesthesia Checklist: Patient identified, Emergency Drugs available, Suction available and Patient being monitored Patient Re-evaluated:Patient Re-evaluated prior to induction Oxygen Delivery Method: Circle system utilized Preoxygenation: Pre-oxygenation with 100% oxygen Induction Type: IV induction Ventilation: Mask ventilation without difficulty Laryngoscope Size: Mac and 4 Tube type: Oral Tube size: 8.0 mm Number of attempts: 1 Airway Equipment and Method: Stylet and Oral airway Placement Confirmation: ETT inserted through vocal cords under direct vision, positive ETCO2 and breath sounds checked- equal and bilateral Secured at: 22 cm Tube secured with: Tape Dental Injury: Teeth and Oropharynx as per pre-operative assessment

## 2021-09-01 ENCOUNTER — Encounter (HOSPITAL_COMMUNITY): Payer: Self-pay | Admitting: Surgery

## 2021-09-01 LAB — BASIC METABOLIC PANEL
Anion gap: 7 (ref 5–15)
BUN: 10 mg/dL (ref 6–20)
CO2: 29 mmol/L (ref 22–32)
Calcium: 8.7 mg/dL — ABNORMAL LOW (ref 8.9–10.3)
Chloride: 101 mmol/L (ref 98–111)
Creatinine, Ser: 0.91 mg/dL (ref 0.61–1.24)
GFR, Estimated: >60 mL/min (ref >60)
Glucose, Bld: 101 mg/dL — ABNORMAL HIGH (ref 70–99)
Potassium: 4.1 mmol/L (ref 3.5–5.1)
Sodium: 137 mmol/L (ref 135–145)

## 2021-09-01 LAB — CBC
HCT: 31.3 % — ABNORMAL LOW (ref 39.0–52.0)
Hemoglobin: 9.6 g/dL — ABNORMAL LOW (ref 13.0–17.0)
MCH: 24.1 pg — ABNORMAL LOW (ref 26.0–34.0)
MCHC: 30.7 g/dL (ref 30.0–36.0)
MCV: 78.4 fL — ABNORMAL LOW (ref 80.0–100.0)
Platelets: 330 10*3/uL (ref 150–400)
RBC: 3.99 MIL/uL — ABNORMAL LOW (ref 4.22–5.81)
RDW: 17 % — ABNORMAL HIGH (ref 11.5–15.5)
WBC: 11.4 10*3/uL — ABNORMAL HIGH (ref 4.0–10.5)
nRBC: 0 % (ref 0.0–0.2)

## 2021-09-01 NOTE — Plan of Care (Signed)

## 2021-09-01 NOTE — Progress Notes (Signed)
  Subjective No acute events. Feeling well. Minimal pain. No n/v. Tolerating liquids without difficulty. Ambulating well on his own. Passing flatus and has had 4-5 Bms.  Objective: Vital signs in last 24 hours: Temp:  [97.3 F (36.3 C)-98.8 F (37.1 C)] 98.3 F (36.8 C) (11/10 0616) Pulse Rate:  [65-74] 66 (11/10 0616) Resp:  [10-20] 20 (11/10 0616) BP: (110-158)/(62-91) 110/62 (11/10 0616) SpO2:  [96 %-100 %] 100 % (11/10 0616) Weight:  [83.1 kg] 83.1 kg (11/09 0935) Last BM Date: 09/01/21  Intake/Output from previous day: 11/09 0701 - 11/10 0700 In: 4224 [P.O.:780; I.V.:3444] Out: 1800 [Urine:1675; Blood:125] Intake/Output this shift: No intake/output data recorded.  Gen: NAD, comfortable CV: RRR Pulm: Normal work of breathing Abd: Soft, minimal tenderness, nondistended. Incisions c/d without drainage. Ext: SCDs in place  Lab Results: CBC  Recent Labs    09/01/21 0407  WBC 11.4*  HGB 9.6*  HCT 31.3*  PLT 330   BMET Recent Labs    09/01/21 0407  NA 137  K 4.1  CL 101  CO2 29  GLUCOSE 101*  BUN 10  CREATININE 0.91  CALCIUM 8.7*   PT/INR No results for input(s): LABPROT, INR in the last 72 hours. ABG No results for input(s): PHART, HCO3 in the last 72 hours.  Invalid input(s): PCO2, PO2  Studies/Results:  Anti-infectives: Anti-infectives (From admission, onward)    Start     Dose/Rate Route Frequency Ordered Stop   08/31/21 0945  cefoTEtan (CEFOTAN) 2 g in sodium chloride 0.9 % 100 mL IVPB        2 g 200 mL/hr over 30 Minutes Intravenous On call to O.R. 08/31/21 0941 08/31/21 1203        Assessment/Plan: Patient Active Problem List   Diagnosis Date Noted   S/P laparoscopic-assisted sigmoidectomy 08/31/2021   s/p Procedure(s): XI ROBOT ASSISTED LOW ANTERIOR RESECTION WITH BILATERAL TAP BLOCK AND INTRAOPERATIVE ASSESSMENT OF PERFUSION USING FIREFLY FLEXIBLE SIGMOIDOSCOPY 08/31/2021  -He is doing great -We spent time reviewing his  procedure, findings and plans moving forward -D/C Foley, D/C IVF, D/C Entereg -Full liquids, advance to soft as tolerated -Path pending -Acute blood loss anemia + chronic iron deficiency anemia 2/2 colon cancer - continue iron replacement, repeat cbc tomorrow -Ppx: SQH, SCDs  LOS: 1 day   Nadeen Landau, MD [facility] Use [email or web address] to contact on call provider

## 2021-09-02 ENCOUNTER — Other Ambulatory Visit: Payer: Self-pay | Admitting: Gastroenterology

## 2021-09-02 ENCOUNTER — Other Ambulatory Visit: Payer: Self-pay

## 2021-09-02 LAB — CBC
HCT: 32.8 % — ABNORMAL LOW (ref 39.0–52.0)
Hemoglobin: 10 g/dL — ABNORMAL LOW (ref 13.0–17.0)
MCH: 23.9 pg — ABNORMAL LOW (ref 26.0–34.0)
MCHC: 30.5 g/dL (ref 30.0–36.0)
MCV: 78.3 fL — ABNORMAL LOW (ref 80.0–100.0)
Platelets: 316 10*3/uL (ref 150–400)
RBC: 4.19 MIL/uL — ABNORMAL LOW (ref 4.22–5.81)
RDW: 17.1 % — ABNORMAL HIGH (ref 11.5–15.5)
WBC: 8.9 10*3/uL (ref 4.0–10.5)
nRBC: 0 % (ref 0.0–0.2)

## 2021-09-02 LAB — BASIC METABOLIC PANEL
Anion gap: 8 (ref 5–15)
BUN: 11 mg/dL (ref 6–20)
CO2: 28 mmol/L (ref 22–32)
Calcium: 8.5 mg/dL — ABNORMAL LOW (ref 8.9–10.3)
Chloride: 101 mmol/L (ref 98–111)
Creatinine, Ser: 0.74 mg/dL (ref 0.61–1.24)
GFR, Estimated: >60 mL/min (ref >60)
Glucose, Bld: 87 mg/dL (ref 70–99)
Potassium: 4 mmol/L (ref 3.5–5.1)
Sodium: 137 mmol/L (ref 135–145)

## 2021-09-02 NOTE — Progress Notes (Signed)
  Subjective No acute events. Feeling well. Pfannenstiel incisional pain overnight, better now. No n/v. Tolerating soft diet without issue. Ambulating well on his own. Continues to have flatus and bms.  Objective: Vital signs in last 24 hours: Temp:  [98.2 F (36.8 C)-98.8 F (37.1 C)] 98.3 F (36.8 C) (11/11 0530) Pulse Rate:  [58-66] 65 (11/11 0530) Resp:  [16-18] 18 (11/11 0530) BP: (115-125)/(61-73) 117/73 (11/11 0530) SpO2:  [97 %-99 %] 98 % (11/11 0530) Weight:  [83.8 kg] 83.8 kg (11/11 0500) Last BM Date: 09/01/21  Intake/Output from previous day: 11/10 0701 - 11/11 0700 In: 1557 [P.O.:1557] Out: 0  Intake/Output this shift: No intake/output data recorded.  Gen: NAD, comfortable CV: RRR Pulm: Normal work of breathing Abd: Soft, minimal tenderness, nondistended. Incisions c/d without drainage. Ext: SCDs in place  Lab Results: CBC  Recent Labs    09/01/21 0407 09/02/21 0408  WBC 11.4* 8.9  HGB 9.6* 10.0*  HCT 31.3* 32.8*  PLT 330 316   BMET Recent Labs    09/01/21 0407 09/02/21 0408  NA 137 137  K 4.1 4.0  CL 101 101  CO2 29 28  GLUCOSE 101* 87  BUN 10 11  CREATININE 0.91 0.74  CALCIUM 8.7* 8.5*   PT/INR No results for input(s): LABPROT, INR in the last 72 hours. ABG No results for input(s): PHART, HCO3 in the last 72 hours.  Invalid input(s): PCO2, PO2  Studies/Results:  Anti-infectives: Anti-infectives (From admission, onward)    Start     Dose/Rate Route Frequency Ordered Stop   08/31/21 0945  cefoTEtan (CEFOTAN) 2 g in sodium chloride 0.9 % 100 mL IVPB        2 g 200 mL/hr over 30 Minutes Intravenous On call to O.R. 08/31/21 0941 08/31/21 1203        Assessment/Plan: Patient Active Problem List   Diagnosis Date Noted   S/P laparoscopic-assisted sigmoidectomy 08/31/2021   s/p Procedure(s): XI ROBOT ASSISTED LOW ANTERIOR RESECTION WITH BILATERAL TAP BLOCK AND INTRAOPERATIVE ASSESSMENT OF PERFUSION USING FIREFLY FLEXIBLE  SIGMOIDOSCOPY 08/31/2021  -He is doing great -Diet as tolerated -WBC normalized, hgb stable -Path pending -Acute blood loss anemia + chronic iron deficiency anemia 2/2 colon cancer - continue iron replacement, repeat cbc tomorrow -Ppx: SQH, SCDs -Dispo: Possible discharge later today if pain controlled and comfortable with this plan  LOS: 2 days   Nadeen Landau, MD [facility] Use [email or web address] to contact on call provider

## 2021-09-03 NOTE — Discharge Summary (Signed)
Physician Discharge Summary  Patient ID: Ethan Martin MRN: [id number] DOB/AGE: Nov 01, 1965 55 y.o.  Admit date: 08/31/2021 Discharge date: 09/03/2021  Admission Diagnoses:  Discharge Diagnoses:  Active Problems:   S/P laparoscopic-assisted sigmoidectomy   Discharged Condition: good  Hospital Course: uneventful post op recovery.  Discharged home POD#3  Consults: None  Significant Diagnostic Studies:   Treatments: surgery: robot assisted LAR  Discharge Exam: Blood pressure 127/76, pulse (!) 54, temperature 98.1 F (36.7 C), temperature source Oral, resp. rate 16, height 6\' 1"  (1.854 m), weight 85.8 kg, SpO2 99 %. General appearance: alert, cooperative, and no distress Resp: clear to auscultation bilaterally Cardio: regular rate and rhythm, S1, S2 normal, no murmur, click, rub or gallop Incision/Wound:abdomen soft, incisions clean  Disposition: Discharge disposition: 01-Home or Self Care        Allergies as of 09/03/2021   No Known Allergies      Medication List     STOP taking these medications    polyethylene glycol 17 g packet Commonly known as: MIRALAX / GLYCOLAX       TAKE these medications    ferrous sulfate 325 (65 FE) MG EC tablet TAKE 1 TABLET (325 MG TOTAL) BY MOUTH 2 (TWO) TIMES DAILY. TAKE WITH VITAMIN C OR ORANGE JUICE PLEASE. TAKE IRON SUPPLEMENT 2 HOURS BEFORE OR 4 HOURS AFTER PPI OR OTHER ANTACIDS   traMADol 50 MG tablet Commonly known as: Ultram Take 1 tablet (50 mg total) by mouth every 6 (six) hours as needed for up to 5 days (postop pain not controlled with tylenol and ibuprofen first).   vitamin B-12 1000 MCG tablet Commonly known as: CYANOCOBALAMIN Take 1 tablet (1,000 mcg total) by mouth daily.        Follow-up Information     Ileana Roup, MD Follow up in 2 week(s).   Specialties: General Surgery, Colon and Rectal Surgery Why: 2-3 weeks after surgery for postoperative appointment Contact information: [address] [place] [id number] [phone]                 Signed: Coralie Keens 09/03/2021, 9:51 AM

## 2021-09-03 NOTE — Plan of Care (Signed)

## 2021-09-03 NOTE — Progress Notes (Signed)
Assessment unchanged. Pt verbalized understanding of dc instructions through teach back. Discharged to front entrance accompanied by NT.

## 2021-09-03 NOTE — Progress Notes (Signed)
Patient ID: Ethan Martin, male   DOB: 1966-07-04, 55 y.o.   MRN: [id number]   Pain control improved Tolerating po Having BM's Abdomen soft  Plan:  discharge home today

## 2021-09-03 NOTE — Plan of Care (Signed)

## 2021-09-06 ENCOUNTER — Telehealth: Payer: Self-pay | Admitting: Nurse Practitioner

## 2021-09-06 LAB — SURGICAL PATHOLOGY

## 2021-09-06 NOTE — Telephone Encounter (Signed)
Scheduled appt per 11/15 referral. Pt is aware of appt date and time.  

## 2021-09-07 ENCOUNTER — Other Ambulatory Visit: Payer: Self-pay

## 2021-09-07 ENCOUNTER — Encounter (HOSPITAL_COMMUNITY): Payer: Self-pay | Admitting: Surgery

## 2021-09-07 NOTE — Progress Notes (Signed)
The proposed treatment discussed in conference is for discussion purpose only and is not a binding recommendation.  The patients have not been physically examined, or presented with their treatment options.  Therefore, final treatment plans cannot be decided.  

## 2021-09-18 NOTE — Progress Notes (Addendum)
[place]   Telephone:([phone]) [phone] Fax:([phone]) [place] Note   Patient Care Team: Patient, No Pcp Per (Inactive) as PCP - General (General Practice) Lavena Bullion, DO as Consulting Physician (Gastroenterology) Ileana Roup, MD as Consulting Physician (General Surgery) Truitt Merle, MD as Consulting Physician (Hematology) Alla Feeling, NP as Nurse Practitioner (Nurse Practitioner) 09/19/2021  CHIEF COMPLAINTS/PURPOSE OF CONSULTATION:  Colon cancer, referred by [facility] surgeon Dr. Nadeen Landau  SUMMARY OF ONCOLOGY HISTORY Oncology History  Malignant neoplasm of sigmoid colon ([place])  07/20/2021 Procedure   Diagnostic EGD by Dr. [name] - Normal esophagus. - Z-line regular, 40 cm from the incisors. - Normal stomach. Biopsied. - Normal examined duodenum. Biopsied. Colonoscopy - Hemorrhoids found on perianal exam. - Likely malignant completely obstructing tumor in the sigmoid colon. Biopsied. Tattooed. - Non-bleeding internal hemorrhoids.   07/20/2021 Initial Biopsy   Diagnosis 1. Duodenum, Biopsy - BENIGN DUODENAL MUCOSA - NO ACUTE INFLAMMATION, VILLOUS BLUNTING OR INCREASED INTRAEPITHELIAL LYMPHOCYTES 2. Stomach, biopsy - MILD CHRONIC GASTRITIS WITH REACTIVE CHANGES - NO H. PYLORI OR INTESTINAL METAPLASIA IDENTIFIED - SEE COMMENT 3. Sigmoid Colon Biopsy - ADENOCARCINOMA - SEE COMMENT Microscopic Comment 2. H. pylori immunohistochemistry is NEGATIVE for microorganisms.   07/20/2021 Tumor Marker   CEA normal 3.2   07/28/2021 Imaging   Staging CT CAP IMPRESSION: 1. Circumferential wall thickening of the distal sigmoid colon, in keeping with primary colon malignancy identified by colonoscopy. 2. There is some nodularity in the adjacent mesocolon about the inferior mesenteric artery, as well as prominent subcentimeter lymph nodes, worrisome for early nodal or soft tissue metastatic disease. 3. No evidence of distant  metastatic disease in the chest, abdomen, or pelvis.   08/31/2021 Cancer Staging   Staging form: Colon and Rectum, AJCC 8th Edition - Pathologic stage from 08/31/2021: Stage IIIB (pT3, pN1b, cM0) - Signed by Alla Feeling, NP on 09/19/2021 Stage prefix: Initial diagnosis Histologic grading system: 4 grade system Histologic grade (G): G2 Laterality: Left Lymph-vascular invasion (LVI): LVI present/identified, NOS Carcinoembryonic antigen (CEA) (ng/mL): 3.2 Perineural invasion (PNI): Absent    08/31/2021 Definitive Surgery   PREOP DIAGNOSIS: sigmoid colon cancer POSTOP DIAGNOSIS: Rectosigmoid colon cancer PROCEDURE:  Robotic assisted low anterior resection with double stapled colorectal anastomosis Intraoperative assessment of perfusion (ICG) Flexible sigmoidoscopy Bilateral transversus abdominus plane blocks SURGEON: Sharon Mt. Dema Severin, MD ASSISTANT: Leighton Ruff, MD   [id number]/[id number] Pathology Results   FINAL MICROSCOPIC DIAGNOSIS: A. COLON, RECTOSIGMOID, RESECTION: - Invasive moderately differentiated adenocarcinoma, 4 cm, involving distal sigmoid colon - Carcinoma invades into subserosa - Inked radial margin is positive for carcinoma - Metastatic carcinoma to two of twenty-two lymph nodes (2/22) - See oncology table B. FINAL DISTAL MARGIN: - Colonic donut, negative for carcinoma pT3pN1b, stage IIIb MMR IHC normal MSI-stable   09/19/2021 Initial Diagnosis   Malignant neoplasm of sigmoid colon ([place])     HISTORY OF PRESENTING ILLNESS:  [name] 55 y.o. male with no significant PMH is here because of newly diagnosed colon cancer. He developed hematochezia 1 year ago that resolved.  In July - September this recurred and he developed constipation. Work up showed microcytic anemia hgb 10.4 on 07/15/21 with elevated RDW. Further work up showed B12 and iron deficiencies with a ferritin of 6. Diagnostic EGD/colonoscopy by Dr. [name] 07/20/21 showed hemorrhoids and a completely  obstructing mass in the sigmoid colon with friability and oozing. The scope could not be passed therefore the colonoscopy was incomplete. Path confirmed adenocarcinoma. CEA  was 3.2. staging CT CAP w contrast 07/28/21 showed circumferential wall thickening in the distal sigmoid colon with some nodularity in the adjacent mesocolon near the SMA and prominent subCM nodes concerning for metastatic disease, otherwise negative for distant metastasis. He underwent robotic assisted low anterior resection by [name] 08/31/21, final surgical path showed invasive moderately differentiated adenocarcinoma spanning 4 cm in the distal sigmoid colon invading in to the subserosa. The radial margin was positive. LVI was present with metastatic carcinoma in 2/22 LNs. Perineural invasion was negative. This was staged pT3pN1b. There was preserved expression of MMR proteins, MSI-stable. Post op course was uneventful and he was discharged home on 09/03/21. He began oral iron and B12 at that time.   Socially, he is a retired Company secretary, working as a Engineer, site for the city. He is married, lives alone. Has 1 healthy child. Independent with ADLs. Denies tobacco or drug use, drinks alcohol 2-3 times per week. He never had a prior colonoscopy. Family history is significant for a paternal aunt with breast cancer and a maternal uncle had cancer but known type.   Today, he presents by himself. He lost 22 lbs since colonoscopy. He is recovering from surgery, has not seen [name] yet. He is eating and drinking. Mild incisional pain is manageable. Bowels moving small and incompletely, usually has 3 BMs per day to complete. Trying to avoid miralax. Denies recurrent blood in stool. Tolerating oral iron. Denies fever, chills, cough, chest pain, dyspnea, leg edema. Wants to know when he can resume exercise.   MEDICAL HISTORY:  Past Medical History:  Diagnosis Date   Arthritis    Cataract    GERD (gastroesophageal reflux disease)      SURGICAL HISTORY: Past Surgical History:  Procedure Laterality Date   FLEXIBLE SIGMOIDOSCOPY N/A 08/31/2021   Procedure: FLEXIBLE SIGMOIDOSCOPY;  Surgeon: Ileana Roup, MD;  Location: [facility];  Service: General;  Laterality: N/A;   NOSE SURGERY     WISDOM TOOTH EXTRACTION     1988    SOCIAL HISTORY: Social History   Socioeconomic History   Marital status: Divorced    Spouse name: Not on file   Number of children: Not on file   Years of education: Not on file   Highest education level: Not on file  Occupational History   Not on file  Tobacco Use   Smoking status: Never   Smokeless tobacco: Never  Vaping Use   Vaping Use: Never used  Substance and Sexual Activity   Alcohol use: Yes    Alcohol/week: 5.0 - 6.0 standard drinks    Types: 2 Cans of beer, 3 - 4 Shots of liquor per week    Comment: drinks 2-3 times per week   Drug use: Not Currently   Sexual activity: Not on file  Other Topics Concern   Not on file  Social History Narrative   Not on file   Social Determinants of Health   Financial Resource Strain: Not on file  Food Insecurity: Not on file  Transportation Needs: Not on file  Physical Activity: Not on file  Stress: Not on file  Social Connections: Not on file  Intimate Partner Violence: Not on file    FAMILY HISTORY: Family History  Problem Relation Age of Onset   Healthy Mother    Healthy Father    Cancer Maternal Aunt        unknown type   Cancer Paternal Aunt        breast  Colon cancer Neg Hx    Esophageal cancer Neg Hx    Rectal cancer Neg Hx    Stomach cancer Neg Hx     ALLERGIES:  has No Known Allergies.  MEDICATIONS:  Current Outpatient Medications  Medication Sig Dispense Refill   ferrous sulfate 325 (65 FE) MG EC tablet TAKE 1 TABLET (325 MG TOTAL) BY MOUTH 2 (TWO) TIMES DAILY. TAKE WITH VITAMIN C OR ORANGE JUICE PLEASE. TAKE IRON SUPPLEMENT 2 HOURS BEFORE OR 4 HOURS AFTER PPI OR OTHER ANTACIDS 30 tablet 6   vitamin  B-12 (CYANOCOBALAMIN) 1000 MCG tablet Take 1 tablet (1,000 mcg total) by mouth daily. 30 tablet 6   No current facility-administered medications for this visit.    REVIEW OF SYSTEMS:   Constitutional: Denies fevers, chills or abnormal night sweats (+) weight loss Eyes: Denies blurriness of vision, double vision or watery eyes Ears, nose, mouth, throat, and face: Denies mucositis or sore throat Respiratory: Denies cough, dyspnea or wheezes Cardiovascular: Denies palpitation, chest discomfort or lower extremity swelling Gastrointestinal:  Denies nausea, vomiting, diarrhea, heartburn or change in bowel habits (+) hematochezia at diagnosis, none since surgery (+) constipation  Skin: Denies abnormal skin rashes Lymphatics: Denies new lymphadenopathy or easy bruising Neurological:Denies numbness, tingling or new weaknesses Behavioral/Psych: Mood is stable, no new changes  All other systems were reviewed with the patient and are negative.  PHYSICAL EXAMINATION: ECOG PERFORMANCE STATUS: 1 - Symptomatic but completely ambulatory  Vitals:   09/19/21 1132  BP: 131/80  Pulse: 79  Resp: 17  Temp: 98.3 F (36.8 C)  SpO2: 100%   Filed Weights   09/19/21 1132  Weight: 185 lb 1 oz (83.9 kg)    GENERAL:alert, no distress and comfortable SKIN: no rash  EYES: sclera clear NECK: without mass LYMPH:  no palpable cervical or supraclavicular lymphadenopathy LUNGS: clear with normal breathing effort HEART: regular rate & rhythm, no lower extremity edema ABDOMEN:abdomen soft, non-tender and normal bowel sounds. LAR and lap incisions closed, healing well. Glue remnants  Musculoskeletal:no cyanosis of digits and no clubbing  PSYCH: alert & oriented x 3 with fluent speech NEURO: no focal motor/sensory deficits  LABORATORY DATA:  I have reviewed the data as listed CBC Latest Ref Rng & Units 09/02/2021 09/01/2021 08/17/2021  WBC 4.0 - 10.5 K/uL 8.9 11.4(H) 3.6(L)  Hemoglobin 13.0 - 17.0 g/dL  10.0(L) 9.6(L) 11.0(L)  Hematocrit 39.0 - 52.0 % 32.8(L) 31.3(L) 37.8(L)  Platelets 150 - 400 K/uL 316 330 364   CMP Latest Ref Rng & Units 09/02/2021 09/01/2021 07/15/2021  Glucose 70 - 99 mg/dL 87 101(H) 76  BUN 6 - 20 mg/dL 11 10 9   Creatinine 0.61 - 1.24 mg/dL 0.74 0.91 0.92  Sodium 135 - 145 mmol/L 137 137 142  Potassium 3.5 - 5.1 mmol/L 4.0 4.1 4.0  Chloride 98 - 111 mmol/L 101 101 106  CO2 22 - 32 mmol/L 28 29 30   Calcium 8.9 - 10.3 mg/dL 8.5(L) 8.7(L) 9.2  Total Protein 6.5 - 8.1 g/dL - - -  Total Bilirubin 0.3 - 1.2 mg/dL - - -  Alkaline Phos 38 - 126 U/L - - -  AST 15 - 41 U/L - - -  ALT 0 - 44 U/L - - -     RADIOGRAPHIC STUDIES: I have personally reviewed the radiological images as listed and agreed with the findings in the report. No results found.  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 -He presented with hematochezia and iron deficiency anemia, first colonoscopy showed completely obstructing mass in the sigmoid colon, path confirmed adenocarcinoma.  Baseline CEA normal, staging work-up was negative for distant metastasis -He underwent surgical resection 08/31/2021 by Dr. Nadeen Landau, path showed invasive moderately differentiated adenocarcinoma spanning 4 cm invading into subserosa.  There was metastatic carcinoma in 2/22 lymph nodes with lymphovascular invasion.  There was no perineural invasion or perforation.  There is question about the radial margin positive for carcinoma, we have contacted [name] to clarify -Mr. Elison is recovering well from surgery, pain is minimal, bowels moving, he has not seen [name] yet -We reviewed the nature of colon Cancer and the moderate to high recurrence risk in stage III, up to 40%.  We recommend  adjuvant chemotherapy to reduce that risk. He understands the benefit is not guaranteed. -We reviewed SE's and logistics of FOLFOX with oxaliplatin, leucovorin, and 46-hour 5-FU pump on day 1 every 14 days versus CAPOX with oxaliplatin on day 1 and Xeloda twice daily on days 1 -14 every 21 days. He prefers CAPOX.  He prefers to avoid a Port-A-Cath. -Chemotherapy consent: Side effects including but not limited to,fatigue, nausea, vomiting, diarrhea, hair thinning, neuropathy, cold sensitivity, hand-foot syndrome, rash, mucositis, fluid retention, renal and kidney dysfunction, neutropenic fever, need for blood transfusion, bleeding, were discussed with patient in great detail. He agrees to proceed.  The treatment goal of adjuvant chemotherapy is curative. -We plan to start next week, lab and follow-up with D1, then 1 week after first oxali for toxicity check.  We will follow-up with [name] about the radial margin in the meantime. -He has been referred to dietitian and will attend chemo education class.  We encouraged his daughter to attend. -we discussed the risk of cancer recurrence in the future and the surveillance plan, which is a physical exam and lab test (including CBC, CMP and CEA) every 3 months for the first 2 years, then every 6-12 months, colonoscopy per Dr. [name], and surveilliance CT scan every 6-12 month for up to 5 year. -Patient seen with Dr. Burr Medico  Weight loss, constipation -He developed constipation in July - September 2022, secondary to #1 -He lost weight when he began colonoscopy prep, cancer work-up, and during surgery -He has up to 3 BM per day to complete, I encouraged him to continue MiraLAX, hydrate -I referred him to dietitian  Iron and B12 deficiency anemia -Secondary to #1 -Work-up 07/19/2021 showed B12 125, ferritin 6, hemoglobin 10 -He began oral B12 and iron supplements, tolerating well -We will follow iron and B12 to see if he needs IV/IM  supplementation  Family history -He has 1 healthy daughter age 51, we discussed beginning screening in her early 48s due to patient's young age at diagnosis -Given no other strong family history he likely does not need genetic testing  Health maintenance -I encouraged a healthy active lifestyle with normal diet, hydration, avoiding smoking, limiting alcohol -He is eager to resume exercise, will need clearance from [name] -He does not get flu or COVID vaccines, I reviewed infection precautions specially on chemo -Given the completely obstructing sigmoid colon mass, his colonoscopy in September was incomplete, he will need to follow-up with Dr. [name] for repeat/complete colonoscopy  PLAN: -Colonoscopy, imaging, surgical path reviewed, stage IIIb colon cancer s/p surgical resection -Adjuvant chemotherapy  with CAPOX starting mid next week -Lab and f/up with C1 D1 -F/up with [name] re: ? Positive radiation margin, I sent him a message -Referrals to dietician and chemo class -Continue oral B12 and iron, will monitor labs -Toxicity check 1 week after first Oxali -Pt will need complete colonoscopy after chemo   Orders Placed This Encounter  Procedures   CBC with Differential ([place] Only)    Standing Status:   Standing    Number of Occurrences:   50    Standing Expiration Date:   09/19/2022   CMP ([place] only)    Standing Status:   Standing    Number of Occurrences:   50    Standing Expiration Date:   09/19/2022   Iron and TIBC    Standing Status:   Standing    Number of Occurrences:   20    Standing Expiration Date:   09/19/2022   Ferritin    Standing Status:   Standing    Number of Occurrences:   20    Standing Expiration Date:   09/19/2022   Vitamin B12    Standing Status:   Standing    Number of Occurrences:   20    Standing Expiration Date:   09/19/2022   Ambulatory Referral to [facility] Nutrition    Referral Priority:   Urgent    Referral Type:    Consultation    Referral Reason:   Specialty Services Required    Number of Visits Requested:   1    All questions were answered. The patient knows to call the clinic with any problems, questions or concerns.     Alla Feeling, NP 09/19/2021   Addendum I have seen the patient, examined him. I agree with the assessment and and plan and have edited the notes.   55 yo male with no significant PMH presented with rectal bleeding, constipation and iron deficient anemia.  [place] anoscopy showed near complete obstructed mass in the sigmoid colon, status post left hemicolectomy.  I reviewed his pathology results with him in detail, discussed the high risk of recurrence due to stage IIIB disease.  His distal margin was positive on surgical pathology, we will review this with his surgeon [name] to see if he needs additional surgery or radiation.  I recommend adjuvant chemotherapy to reduce his risk of recurrence.  We discussed the option of FOLFOX and CAPOX, he opted to have Capox, and no port placement. Chemo consent obtained. He has recovered well from surgery, plan to start in next few weeks if no more surgery planned. I will call in Xeloda to [place].  We also discussed cancer surveillance.  All questions were answered. Truitt Merle  09/19/2021

## 2021-09-19 ENCOUNTER — Other Ambulatory Visit: Payer: Self-pay

## 2021-09-19 ENCOUNTER — Encounter: Payer: Self-pay | Admitting: Nurse Practitioner

## 2021-09-19 ENCOUNTER — Inpatient Hospital Stay: Payer: 59 | Attending: Nurse Practitioner | Admitting: Nurse Practitioner

## 2021-09-19 ENCOUNTER — Other Ambulatory Visit: Payer: Self-pay | Admitting: Hematology

## 2021-09-19 DIAGNOSIS — Z803 Family history of malignant neoplasm of breast: Secondary | ICD-10-CM | POA: Diagnosis not present

## 2021-09-19 DIAGNOSIS — C187 Malignant neoplasm of sigmoid colon: Secondary | ICD-10-CM | POA: Insufficient documentation

## 2021-09-19 DIAGNOSIS — D509 Iron deficiency anemia, unspecified: Secondary | ICD-10-CM | POA: Diagnosis not present

## 2021-09-19 DIAGNOSIS — D519 Vitamin B12 deficiency anemia, unspecified: Secondary | ICD-10-CM | POA: Insufficient documentation

## 2021-09-19 DIAGNOSIS — R634 Abnormal weight loss: Secondary | ICD-10-CM | POA: Diagnosis not present

## 2021-09-19 DIAGNOSIS — K5900 Constipation, unspecified: Secondary | ICD-10-CM | POA: Insufficient documentation

## 2021-09-19 MED ORDER — ONDANSETRON HCL 8 MG PO TABS: 8.0000 mg | ORAL_TABLET | Freq: Two times a day (BID) | ORAL | 1 refills | Status: DC | PRN

## 2021-09-19 MED ORDER — PROCHLORPERAZINE MALEATE 10 MG PO TABS: 10.0000 mg | ORAL_TABLET | Freq: Four times a day (QID) | ORAL | 1 refills | Status: DC | PRN

## 2021-09-19 MED ORDER — CAPECITABINE 500 MG PO TABS
ORAL_TABLET | ORAL | 0 refills | Status: DC
  Filled 2021-09-19: qty 98, fill #0

## 2021-09-19 NOTE — Progress Notes (Signed)
START ON PATHWAY REGIMEN - Colorectal     A cycle is every 21 days:     Capecitabine      Oxaliplatin   **Always confirm dose/schedule in your pharmacy ordering system**  Patient Characteristics: Postoperative without Neoadjuvant Therapy (Pathologic Staging), Colon, Stage III, Low Risk (pT1-3, pN1) Tumor Location: Colon Therapeutic Status: Postoperative without Neoadjuvant Therapy (Pathologic Staging) AJCC M Category: cM0 AJCC T Category: pT3 AJCC N Category: pN1b AJCC 8 Stage Grouping: IIIB Intent of Therapy: Curative Intent, Discussed with Patient

## 2021-09-19 NOTE — Progress Notes (Signed)
I met with Ethan Martin during his consultation with Cira Rue, NP and Dr Burr Medico.  I explained my role as a nurse navigator and provided my contact information. I explained the services provided at [facility] and provided written information.  I briefly explained insertion and care of a port a cath.  I showed a sample of the port a cath. He has declined a port a cath at this time.  I briefly reviewed most common side effects of capecitabine and oxaliplatin, diarrhea, cold sensitivity, nausea/vomiting.  I assured him this will be reviewed in detail during his chemotherapy education appointment.  All questions were answered.  He verbalized understanding.

## 2021-09-20 ENCOUNTER — Telehealth: Payer: Self-pay | Admitting: Pharmacist

## 2021-09-20 ENCOUNTER — Inpatient Hospital Stay: Payer: 59 | Admitting: Nutrition

## 2021-09-20 ENCOUNTER — Telehealth: Payer: Self-pay | Admitting: Nurse Practitioner

## 2021-09-20 ENCOUNTER — Telehealth: Payer: Self-pay | Admitting: Nutrition

## 2021-09-20 ENCOUNTER — Other Ambulatory Visit (HOSPITAL_COMMUNITY): Payer: Self-pay

## 2021-09-20 ENCOUNTER — Encounter: Payer: Self-pay | Admitting: Hematology

## 2021-09-20 DIAGNOSIS — C187 Malignant neoplasm of sigmoid colon: Secondary | ICD-10-CM

## 2021-09-20 MED ORDER — CAPECITABINE 500 MG PO TABS
ORAL_TABLET | ORAL | 0 refills | Status: DC
  Filled 2021-09-20: qty 98, fill #0

## 2021-09-20 MED ORDER — CAPECITABINE 500 MG PO TABS: ORAL_TABLET | ORAL | 0 refills | Status: DC

## 2021-09-20 NOTE — Telephone Encounter (Signed)
Oral Oncology Pharmacist Encounter  Received new prescription for Xeloda (capecitabine) for the treatment of stage IIIB colon cancer in conjunction with oxaliplatin, planned duration ~3 months.  CBC and BMP from 09/02/21 assessed, no relevant lab abnormalities noted. Prescription dose and frequency assessed for appropriateness.   Current medication list in Epic reviewed, no relevant/significant DDIs with Xeloda identified.  Evaluated chart and no patient barriers to medication adherence noted.   Patient agreement for treatment documented in MD note on 09/19/21.  PA is not required at this time per patient's insurance for Xeloda. Patient's insurance requires Xeloda to be filled through [employer] Prescription redirected for dispensing.   Oral Oncology Clinic will continue to follow for insurance authorization, copayment issues, initial counseling and start date.  Leron Croak, PharmD, BCPS Hematology/Oncology Clinical Pharmacist [facility] and [place] [phone] 09/20/2021 8:34 AM

## 2021-09-20 NOTE — Telephone Encounter (Signed)
Scheduled follow-up appointments per 11/28 los. Patient is aware.

## 2021-09-20 NOTE — Telephone Encounter (Signed)
Telephone consult completed with patient.  Patient is a 55 year old male diagnosed with colon cancer status post left hemicolectomy due to near complete obstruction.  He is followed by Dr. Burr Medico.  He is receiving CAPOX.  Past medical history includes GERD.  Medications include vitamin B12, iron, Xeloda, Zofran, and Compazine.  Labs include glucose 101.  Height: 6 feet 1 inch. Weight: 185 pounds November 28. Usual body weight: 203 pounds November 2021. BMI: 24.42.  Patient endorses 22 pound weight loss after his colonoscopy.  He is waiting for permission to begin exercising from his surgeon. He has a good appetite and has been consuming a regular diet.  He reports that his colon does not completely empty and it is concerning for him.  He has been told to take MiraLAX. Patient states that he tries not to eat beef or processed meats.  He eats a lot of salads.  He drinks soy milk.  He has used a protein powder to make a protein shake prior to surgery.  He complains of some bloating.  Nutrition diagnosis: Food and nutrition related knowledge deficit related to colon cancer and associated treatments as evidenced by no prior need for nutrition related information.  Intervention: Educated patient to consume small frequent meals and snacks with high-calorie, high-protein foods as tolerated.  Encouraged patient to continue healthy foods with decreased processed meats. Reviewed high-protein foods and encouraged consumption at every meal and snack. Consider decreasing gas-forming vegetables and dried beans and peas to provide increased comfort.  Increase these foods gradually. Okay to resume 1 protein shake daily. Patient to wait for approval from surgeon before beginning exercise and weight training. Patient will receive eating hints booklet at chemo education tomorrow. Questions were answered.  Teach back method used.  Contact information given.  Monitoring, evaluation, goals: Patient will  tolerate adequate calories and protein to minimize weight loss throughout treatment.  Next visit: Friday, December 9 during first infusion to follow-up on questions or concerns.  **Disclaimer: This note was dictated with voice recognition software. Similar sounding words can inadvertently be transcribed and this note may contain transcription errors which may not have been corrected upon publication of note.**

## 2021-09-20 NOTE — Telephone Encounter (Signed)
Oral Chemotherapy Pharmacist Encounter  I spoke with patient for overview of: Xeloda (capecitabine) for the stage IIIB colon cancer cancer in conjunction with infusional oxaliplatin, planned duration ~3 months of therapy.   Counseled patient on administration, dosing, side effects, monitoring, drug-food interactions, safe handling, storage, and disposal.  Patient will take Xeloda 500mg  tablets, 3 tablets (1500mg ) by mouth in AM and 4 tabs (2000mg ) by mouth in PM, within 30 minutes of finishing meals, on days 1-14 of each 21 day cycle.   Oxaliplatin will be infused on day 1 of each 21 day cycle.  Xeloda and oxaliplatin start date: 09/30/21  Adverse effects include but are not limited to: fatigue, decreased blood counts, GI upset, diarrhea, mouth sores, and hand-foot syndrome.  Patient has anti-emetic on hand and knows to take it if nausea develops.   Patient will obtain anti diarrheal and alert the office of 4 or more loose stools above baseline.  Reviewed with patient importance of keeping a medication schedule and plan for any missed doses. No barriers to medication adherence identified.  Medication reconciliation performed and medication/allergy list updated.  Insurance authorization for Xeloda has been obtained. Patient's insurance requires that this be filled through [employer] Patient knows to call office if he has not heard from [place] by 09/23/21.   All questions answered.  Ethan Martin voiced understanding and appreciation.   Medication education handout placed in mail for patient. Patient knows to call the office with questions or concerns. Oral Chemotherapy Clinic phone number provided to patient.   Leron Croak, PharmD, BCPS Hematology/Oncology Clinical Pharmacist [facility] and [place] [phone] 09/20/2021 2:15 PM

## 2021-09-21 ENCOUNTER — Other Ambulatory Visit: Payer: Self-pay

## 2021-09-21 ENCOUNTER — Inpatient Hospital Stay: Payer: 59

## 2021-09-21 ENCOUNTER — Encounter: Payer: Self-pay | Admitting: Hematology

## 2021-09-21 NOTE — Progress Notes (Signed)
Met with patient at registration to introduce myself as Arboriculturist and to offer available resources.  Discussed one-time $1000 Radio broadcast assistant to assist with personal expenses while going through treatment. Based on verbal income guidelines, he exceeds. Advised should there be any changes to feel free to reach out to me.  He has my card for any additional financial questions or concerns.

## 2021-09-22 ENCOUNTER — Other Ambulatory Visit: Payer: Self-pay

## 2021-09-23 NOTE — Progress Notes (Signed)
Pharmacist Chemotherapy Monitoring - Initial Assessment    Anticipated start date: 09/30/21   The following has been reviewed per standard work regarding the patient's treatment regimen: The patient's diagnosis, treatment plan and drug doses, and organ/hematologic function Lab orders and baseline tests specific to treatment regimen  The treatment plan start date, drug sequencing, and pre-medications Prior authorization status  Patient's documented medication list, including drug-drug interaction screen and prescriptions for anti-emetics and supportive care specific to the treatment regimen The drug concentrations, fluid compatibility, administration routes, and timing of the medications to be used The patient's access for treatment and lifetime cumulative dose history, if applicable  The patient's medication allergies and previous infusion related reactions, if applicable   Changes made to treatment plan:  treatment plan date  Follow up needed:  N/A   Kennith Center, Pharm.D., CPP 09/23/2021@3 :02 PM

## 2021-09-29 MED FILL — Dexamethasone Sodium Phosphate Inj 100 MG/10ML: INTRAMUSCULAR | Qty: 1 | Status: AC

## 2021-09-30 ENCOUNTER — Encounter: Payer: Self-pay | Admitting: Hematology

## 2021-09-30 ENCOUNTER — Inpatient Hospital Stay: Payer: 59 | Admitting: Dietician

## 2021-09-30 ENCOUNTER — Inpatient Hospital Stay: Payer: 59 | Admitting: Hematology

## 2021-09-30 ENCOUNTER — Other Ambulatory Visit: Payer: Self-pay

## 2021-09-30 ENCOUNTER — Inpatient Hospital Stay: Payer: 59

## 2021-09-30 ENCOUNTER — Inpatient Hospital Stay: Payer: 59 | Attending: Nurse Practitioner

## 2021-09-30 VITALS — BP 150/82 | HR 69 | Temp 98.2°F | Resp 18 | Wt 190.2 lb

## 2021-09-30 DIAGNOSIS — C779 Secondary and unspecified malignant neoplasm of lymph node, unspecified: Secondary | ICD-10-CM | POA: Insufficient documentation

## 2021-09-30 DIAGNOSIS — C187 Malignant neoplasm of sigmoid colon: Secondary | ICD-10-CM

## 2021-09-30 DIAGNOSIS — Z5111 Encounter for antineoplastic chemotherapy: Secondary | ICD-10-CM | POA: Insufficient documentation

## 2021-09-30 LAB — CBC WITH DIFFERENTIAL (CANCER CENTER ONLY)
Abs Immature Granulocytes: 0 10*3/uL (ref 0.00–0.07)
Basophils Absolute: 0 10*3/uL (ref 0.0–0.1)
Basophils Relative: 1 %
Eosinophils Absolute: 0.1 10*3/uL (ref 0.0–0.5)
Eosinophils Relative: 2 %
HCT: 35.5 % — ABNORMAL LOW (ref 39.0–52.0)
Hemoglobin: 10.8 g/dL — ABNORMAL LOW (ref 13.0–17.0)
Immature Granulocytes: 0 %
Lymphocytes Relative: 37 %
Lymphs Abs: 1.4 10*3/uL (ref 0.7–4.0)
MCH: 23.7 pg — ABNORMAL LOW (ref 26.0–34.0)
MCHC: 30.4 g/dL (ref 30.0–36.0)
MCV: 78 fL — ABNORMAL LOW (ref 80.0–100.0)
Monocytes Absolute: 0.4 10*3/uL (ref 0.1–1.0)
Monocytes Relative: 10 %
Neutro Abs: 1.9 10*3/uL (ref 1.7–7.7)
Neutrophils Relative %: 50 %
Platelet Count: 277 10*3/uL (ref 150–400)
RBC: 4.55 MIL/uL (ref 4.22–5.81)
RDW: 16.6 % — ABNORMAL HIGH (ref 11.5–15.5)
Smear Review: NORMAL
WBC Count: 3.8 10*3/uL — ABNORMAL LOW (ref 4.0–10.5)
nRBC: 0 % (ref 0.0–0.2)

## 2021-09-30 LAB — IRON AND TIBC
Iron: 37 ug/dL — ABNORMAL LOW (ref 42–163)
Saturation Ratios: 8 % — ABNORMAL LOW (ref 20–55)
TIBC: 448 ug/dL — ABNORMAL HIGH (ref 202–409)
UIBC: 411 ug/dL — ABNORMAL HIGH (ref 117–376)

## 2021-09-30 LAB — CMP (CANCER CENTER ONLY)
ALT: 7 U/L (ref 0–44)
AST: 12 U/L — ABNORMAL LOW (ref 15–41)
Albumin: 3.7 g/dL (ref 3.5–5.0)
Alkaline Phosphatase: 87 U/L (ref 38–126)
Anion gap: 10 (ref 5–15)
BUN: 8 mg/dL (ref 6–20)
CO2: 25 mmol/L (ref 22–32)
Calcium: 8.6 mg/dL — ABNORMAL LOW (ref 8.9–10.3)
Chloride: 107 mmol/L (ref 98–111)
Creatinine: 1.01 mg/dL (ref 0.61–1.24)
GFR, Estimated: >60 mL/min (ref >60)
Glucose, Bld: 102 mg/dL — ABNORMAL HIGH (ref 70–99)
Potassium: 3.6 mmol/L (ref 3.5–5.1)
Sodium: 142 mmol/L (ref 135–145)
Total Bilirubin: 0.4 mg/dL (ref 0.3–1.2)
Total Protein: 7.7 g/dL (ref 6.5–8.1)

## 2021-09-30 LAB — VITAMIN B12: Vitamin B-12: 191 pg/mL (ref 180–914)

## 2021-09-30 LAB — FERRITIN: Ferritin: 5 ng/mL — ABNORMAL LOW (ref 24–336)

## 2021-09-30 MED ORDER — OXALIPLATIN CHEMO INJECTION 100 MG/20ML
130.0000 mg/m2 | Freq: Once | INTRAVENOUS | Status: AC
  Administered 2021-09-30: 270 mg via INTRAVENOUS
  Filled 2021-09-30: qty 54

## 2021-09-30 MED ORDER — PALONOSETRON HCL INJECTION 0.25 MG/5ML
0.2500 mg | Freq: Once | INTRAVENOUS | Status: AC
  Administered 2021-09-30: 0.25 mg via INTRAVENOUS
  Filled 2021-09-30: qty 5

## 2021-09-30 MED ORDER — DEXAMETHASONE SODIUM PHOSPHATE 100 MG/10ML IJ SOLN
10.0000 mg | Freq: Once | INTRAMUSCULAR | Status: AC
  Administered 2021-09-30: 10 mg via INTRAVENOUS
  Filled 2021-09-30: qty 10

## 2021-09-30 MED ORDER — DEXTROSE 5 % IV SOLN
Freq: Once | INTRAVENOUS | Status: AC
Start: 2021-09-30 — End: 2021-09-30

## 2021-09-30 NOTE — Progress Notes (Signed)
Nutrition Follow-up:  Patient receiving CAPOX for colon cancer. S/p left hemicolectomy.   Met with patient during infusion. He reports good appetite, eating smaller amounts more frequently. He is tolerating regular diet. Patient unable to recall full intake from yesterday, had rice krispies for breakfast, wings and pizza for dinner. Patient reports bowel movements are more complete than previously, takes miralax as needed. Patient has not been advised on when he can resume exercising. Physical activity is important to him, this helps with appetite/mood. Patient has asked Dr. Burr Medico about this today. Patient reports he was unaware of pending plans for radiation which he learned today. Patient reports this was not previously discussed with him and expected to be finished with treatment in February.   Medications: reviewed   Labs: Glucose 102  Anthropometrics: weight 190 lb 4 oz today increased from 185 lb on 11/29   NUTRITION DIAGNOSIS: Food and nutrition related knowledge deficit improved   INTERVENTION:  Continue eating high calorie, high protein foods for weight maintenance  Patient is awaiting exercise clearance per surgery Provided support and encouragement Patient provided contact information     MONITORING, EVALUATION, GOAL: weight trends, intake    NEXT VISIT: To be scheduled as needed with treatment

## 2021-09-30 NOTE — Progress Notes (Signed)
[facility]   Telephone:(336) [phone] Fax:(336) [phone]   Clinic Follow up Note   Patient Care Team: Patient, No Pcp Per (Inactive) as PCP - General (General Practice) Lavena Bullion, DO as Consulting Physician (Gastroenterology) Ileana Roup, MD as Consulting Physician (General Surgery) Truitt Merle, MD as Consulting Physician (Hematology) Alla Feeling, NP as Nurse Practitioner (Nurse Practitioner)  Date of Service:  09/30/2021  CHIEF COMPLAINT: f/u of colon cancer  CURRENT THERAPY:  Adjuvant CAPOX, with Xeloda 1549m AM, 20074mPM 14 days on/7 days off, started 09/30/21  ASSESSMENT & PLAN:  Ethan Martin a [age].o. male with   1. 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  Baseline CEA normal, staging work-up was negative for distant metastasis -He underwent surgical resection 08/31/2021 by Dr. WhDema Severinpath showed invasive moderately differentiated adenocarcinoma spanning 4 cm invading into subserosa.  There was metastatic carcinoma in 2/22 lymph nodes with lymphovascular invasion.  There was no perineural invasion or perforation.  There is question about the radial margin positive for carcinoma, we contacted Dr. WhDema Severino clarify, and he reports he does not plan for further surgery and recommends radiation therapy. -I reviewed the role of adjuvant radiation therapy with him today, due to his positive surgical margin. I have discussed this with Drs. White and [place] is for CAPOX followed by radiation with Xeloda. -he is scheduled to start CAPOX today, with Xeloda 1500 mg in the morning, 2000 mg in the evening for 14 days on/7 days off, today. Labs reviewed, he continues to have anemia. -I will see him in a week  for toxicity check   2. Weight loss, constipation -He developed constipation in July - September 2022, secondary to #1 -He began losing weight when he began colonoscopy prep -He has up to 3 BM per day to complete, I encouraged him to continue MiraLAX, hydrate -he has established care with dietician, who will see him today.   3. Iron and B12 deficiency anemia -Secondary to #1 -Work-up 07/19/2021 showed B12 125, ferritin 6, hemoglobin 10 -He began oral B12 and iron supplements, tolerating well -his iron today is still low at 37, ferritin 5, hgb 10.8. B12 is on the low end of normal at 191. -I recommend IV iron and B12 injections, he declined. -I advised him to increase his oral iron to BID and his B12 to two tablets BID.   4. Family history -He has 1 healthy daughter age 5120we discussed beginning screening in her early 4019sue to patient's young age at diagnosis -Given no other strong family history he likely does not need genetic testing   5. Health maintenance -I encouraged a healthy active lifestyle with normal diet, hydration, avoiding smoking, limiting alcohol -He is eager to resume exercise, will need clearance from Dr. WhDema SeverinHe does not get flu or COVID vaccines, I reviewed infection precautions specially on chemo -Given the completely obstructing sigmoid colon mass, his colonoscopy in September was incomplete, he will need to follow-up with Dr. CiBryan Lemmaor repeat/complete colonoscopy    PLAN: -proceed with first oxaliplatin today -increase oral iron and B12 -lab and f/u in 1 week for toxicity check -lab, f/u, and C2 CAPOX in 3 and 6 weeks   No problem-specific Assessment & Plan notes found for this encounter.   SUMMARY  OF ONCOLOGIC HISTORY: Oncology History  Malignant neoplasm of sigmoid colon ([place])  07/20/2021 Procedure   Diagnostic EGD by Dr. [name] - Normal esophagus. - Z-line regular, 40 cm from the incisors. - Normal stomach. Biopsied. - Normal examined  duodenum. Biopsied. Colonoscopy - Hemorrhoids found on perianal exam. - Likely malignant completely obstructing tumor in the sigmoid colon. Biopsied. Tattooed. - Non-bleeding internal hemorrhoids.   07/20/2021 Initial Biopsy   Diagnosis 1. Duodenum, Biopsy - BENIGN DUODENAL MUCOSA - NO ACUTE INFLAMMATION, VILLOUS BLUNTING OR INCREASED INTRAEPITHELIAL LYMPHOCYTES 2. Stomach, biopsy - MILD CHRONIC GASTRITIS WITH REACTIVE CHANGES - NO H. PYLORI OR INTESTINAL METAPLASIA IDENTIFIED - SEE COMMENT 3. Sigmoid Colon Biopsy - ADENOCARCINOMA - SEE COMMENT Microscopic Comment 2. H. pylori immunohistochemistry is NEGATIVE for microorganisms.   07/20/2021 Tumor Marker   CEA normal 3.2   07/28/2021 Imaging   Staging CT CAP IMPRESSION: 1. Circumferential wall thickening of the distal sigmoid colon, in keeping with primary colon malignancy identified by colonoscopy. 2. There is some nodularity in the adjacent mesocolon about the inferior mesenteric artery, as well as prominent subcentimeter lymph nodes, worrisome for early nodal or soft tissue metastatic disease. 3. No evidence of distant metastatic disease in the chest, abdomen, or pelvis.   08/31/2021 Cancer Staging   Staging form: Colon and Rectum, AJCC 8th Edition - Pathologic stage from 08/31/2021: Stage IIIB (pT3, pN1b, cM0) - Signed by Alla Feeling, NP on 09/19/2021 Stage prefix: Initial diagnosis Histologic grading system: 4 grade system Histologic grade (G): G2 Laterality: Left Lymph-vascular invasion (LVI): LVI present/identified, NOS Carcinoembryonic antigen (CEA) (ng/mL): 3.2 Perineural invasion (PNI): Absent    08/31/2021 Definitive Surgery   PREOP DIAGNOSIS: sigmoid colon cancer POSTOP DIAGNOSIS: Rectosigmoid colon cancer PROCEDURE:  Robotic assisted low anterior resection with double stapled colorectal anastomosis Intraoperative assessment of perfusion (ICG) Flexible sigmoidoscopy Bilateral transversus abdominus plane  blocks SURGEON: Sharon Mt. Dema Severin, MD ASSISTANT: Leighton Ruff, MD   23/02/5731 Pathology Results   FINAL MICROSCOPIC DIAGNOSIS: A. COLON, RECTOSIGMOID, RESECTION: - Invasive moderately differentiated adenocarcinoma, 4 cm, involving distal sigmoid colon - Carcinoma invades into subserosa - Inked radial margin is positive for carcinoma - Metastatic carcinoma to two of twenty-two lymph nodes (2/22) - See oncology table B. FINAL DISTAL MARGIN: - Colonic donut, negative for carcinoma pT3pN1b, stage IIIb MMR IHC normal MSI-stable   09/19/2021 Initial Diagnosis   Malignant neoplasm of sigmoid colon ([place])   09/30/2021 -  Chemotherapy   Patient is on Treatment Plan : COLORECTAL Xelox ([place]apeox) q21d        INTERVAL HISTORY:  Ethan Martin is here for a follow up of colon cancer. He was last seen by me on 09/19/21 in consultation with NP Lacie. He was seen in the infusion area.   All other systems were reviewed with the patient and are negative.  MEDICAL HISTORY:  Past Medical History:  Diagnosis Date   Arthritis    Cataract    GERD (gastroesophageal reflux disease)     SURGICAL HISTORY: Past Surgical History:  Procedure Laterality Date   FLEXIBLE SIGMOIDOSCOPY N/A 08/31/2021   Procedure: FLEXIBLE SIGMOIDOSCOPY;  Surgeon: Ileana Roup, MD;  Location: [facility];  Service: General;  Laterality: N/A;   NOSE SURGERY     WISDOM TOOTH EXTRACTION     1988    I have reviewed the social history and family history with the patient and they are unchanged from previous note.  ALLERGIES:  has No Known Allergies.  MEDICATIONS:  Current Outpatient Medications  Medication  Sig Dispense Refill   capecitabine (XELODA) 500 MG tablet Take 3 tablets by mouth in morning and 4 tablets by mouth in evening, every 12 hours. Take within 30 minutes after meals. Take for 14 days on, 7 days off. Repeat every 21 days 98 tablet 0   ferrous sulfate 325 (65 FE) MG EC tablet TAKE 1 TABLET (325 MG TOTAL)  BY MOUTH 2 (TWO) TIMES DAILY. TAKE WITH VITAMIN C OR ORANGE JUICE PLEASE. TAKE IRON SUPPLEMENT 2 HOURS BEFORE OR 4 HOURS AFTER PPI OR OTHER ANTACIDS 30 tablet 6   ondansetron (ZOFRAN) 8 MG tablet Take 1 tablet (8 mg total) by mouth 2 (two) times daily as needed for refractory nausea / vomiting. Start on day 3 after chemotherapy. 30 tablet 1   prochlorperazine (COMPAZINE) 10 MG tablet Take 1 tablet (10 mg total) by mouth every 6 (six) hours as needed (Nausea or vomiting). 30 tablet 1   vitamin B-12 (CYANOCOBALAMIN) 1000 MCG tablet Take 1 tablet (1,000 mcg total) by mouth daily. 30 tablet 6   No current facility-administered medications for this visit.    PHYSICAL EXAMINATION: ECOG PERFORMANCE STATUS: 0 - Asymptomatic  There were no vitals filed for this visit. Wt Readings from Last 3 Encounters:  09/30/21 190 lb 4 oz (86.3 kg)  09/19/21 185 lb 1 oz (83.9 kg)  09/03/21 189 lb 2.5 oz (85.8 kg)     GENERAL:alert, no distress and comfortable SKIN: skin color normal, no rashes or significant lesions EYES: normal, Conjunctiva are pink and non-injected, sclera clear  NEURO: alert & oriented x 3 with fluent speech  LABORATORY DATA:  I have reviewed the data as listed CBC Latest Ref Rng & Units 09/30/2021 09/02/2021 09/01/2021  WBC 4.0 - 10.5 K/uL 3.8(L) 8.9 11.4(H)  Hemoglobin 13.0 - 17.0 g/dL 10.8(L) 10.0(L) 9.6(L)  Hematocrit 39.0 - 52.0 % 35.5(L) 32.8(L) 31.3(L)  Platelets 150 - 400 K/uL 277 316 330     CMP Latest Ref Rng & Units 09/30/2021 09/02/2021 09/01/2021  Glucose 70 - 99 mg/dL 102(H) 87 101(H)  BUN 6 - 20 mg/dL 8 11 10   Creatinine 0.61 - 1.24 mg/dL 1.01 0.74 0.91  Sodium 135 - 145 mmol/L 142 137 137  Potassium 3.5 - 5.1 mmol/L 3.6 4.0 4.1  Chloride 98 - 111 mmol/L 107 101 101  CO2 22 - 32 mmol/L 25 28 29   Calcium 8.9 - 10.3 mg/dL 8.6(L) 8.5(L) 8.7(L)  Total Protein 6.5 - 8.1 g/dL 7.7 - -  Total Bilirubin 0.3 - 1.2 mg/dL 0.4 - -  Alkaline Phos 38 - 126 U/L 87 - -  AST 15 -  41 U/L 12(L) - -  ALT 0 - 44 U/L 7 - -      RADIOGRAPHIC STUDIES: I have personally reviewed the radiological images as listed and agreed with the findings in the report. No results found.    No orders of the defined types were placed in this encounter.  All questions were answered. The patient knows to call the clinic with any problems, questions or concerns. No barriers to learning was detected. The total time spent in the appointment was 30 minutes.     Truitt Merle, MD 09/30/2021   I, Wilburn Mylar, am acting as scribe for Truitt Merle, MD.   I have reviewed the above documentation for accuracy and completeness, and I agree with the above.

## 2021-09-30 NOTE — Patient Instructions (Signed)
[facility] MEDICAL [facility]COL[facility]   ?Discharge Instructions: ?Thank you for choosing [facility] to provide your oncology and hematology care.  ? ?If you have a lab appointment with the Cancer Center, please go directly to the Cancer Center and check in at the registration area. ?  ?Wear comfortable clothing and clothing appropriate for easy access to any Portacath or PICC line.  ? ?We strive to give you quality time with your provider. You may need to reschedule your appointment if you arrive late (15 or more minutes).  Arriving late affects you and other patients whose appointments are after yours.  Also, if you miss three or more appointments without notifying the office, you may be dismissed from the clinic at the provider?s discretion.    ?  ?For prescription refill requests, have your pharmacy contact our office and allow 72 hours for refills to be completed.   ? ?Today you received the following chemotherapy and/or immunotherapy agents: oxaliplatin    ?  ?To help prevent nausea and vomiting after your treatment, we encourage you to take your nausea medication as directed. ? ?BELOW ARE SYMPTOMS THAT SHOULD BE REPORTED IMMEDIATELY: ?*FEVER GREATER THAN 100.4 F (38 ?C) OR HIGHER ?*CHILLS OR SWEATING ?*NAUSEA AND VOMITING THAT IS NOT CONTROLLED WITH YOUR NAUSEA MEDICATION ?*UNUSUAL SHORTNESS OF BREATH ?*UNUSUAL BRUISING OR BLEEDING ?*URINARY PROBLEMS (pain or burning when urinating, or frequent urination) ?*BOWEL PROBLEMS (unusual diarrhea, constipation, pain near the anus) ?TENDERNESS IN MOUTH AND THROAT WITH OR WITHOUT PRESENCE OF ULCERS (sore throat, sores in mouth, or a toothache) ?UNUSUAL RASH, SWELLING OR PAIN  ?UNUSUAL VAGINAL DISCHARGE OR ITCHING  ? ?Items with * indicate a potential emergency and should be followed up as soon as possible or go to the Emergency Department if any problems should occur. ? ?Please show the CHEMOTHERAPY ALERT CARD or IMMUNOTHERAPY ALERT CARD at check-in  to the Emergency Department and triage nurse. ? ?Should you have questions after your visit or need to cancel or reschedule your appointment, please contact [facility]  Dept: [phone]  and follow the prompts.  Office hours are 8:00 a.m. to 4:30 p.m. Monday - Friday. Please note that voicemails left after 4:00 p.m. may not be returned until the following business day.  We are closed weekends and major holidays. You have access to a nurse at all times for urgent questions. Please call the main number to the clinic Dept: [phone] and follow the prompts. ? ? ?For any non-urgent questions, you may also contact your provider using MyChart. We now offer e-Visits for anyone 18 and older to request care online for non-urgent symptoms. For details visit [email or web address]. ?  ?Also download the MyChart app! Go to the app store, search "MyChart", open the app, select [facility], and log in with your MyChart username and password. ? ?Due to Covid, a mask is required upon entering the hospital/clinic. If you do not have a mask, one will be given to you upon arrival. For doctor visits, patients may have 1 support person aged 18 or older with them. For treatment visits, patients cannot have anyone with them due to current Covid guidelines and our immunocompromised population.  ? ?

## 2021-10-05 ENCOUNTER — Telehealth: Payer: Self-pay | Admitting: Hematology

## 2021-10-05 NOTE — Telephone Encounter (Signed)
Scheduled follow-up appointment per 12/9 los. Patient is aware. 

## 2021-10-06 ENCOUNTER — Other Ambulatory Visit: Payer: Self-pay | Admitting: Hematology

## 2021-10-06 DIAGNOSIS — C187 Malignant neoplasm of sigmoid colon: Secondary | ICD-10-CM

## 2021-10-07 ENCOUNTER — Encounter: Payer: Self-pay | Admitting: Hematology

## 2021-10-07 ENCOUNTER — Inpatient Hospital Stay (HOSPITAL_BASED_OUTPATIENT_CLINIC_OR_DEPARTMENT_OTHER): Payer: 59 | Admitting: Hematology

## 2021-10-07 ENCOUNTER — Other Ambulatory Visit: Payer: 59

## 2021-10-07 ENCOUNTER — Ambulatory Visit: Payer: 59 | Admitting: Hematology

## 2021-10-07 DIAGNOSIS — C187 Malignant neoplasm of sigmoid colon: Secondary | ICD-10-CM

## 2021-10-07 NOTE — Progress Notes (Signed)
[place]   [phone] [phone]   Clinic Follow up Note   Patient Care Team: Patient, No Pcp Per (Inactive) as PCP - General (General Practice) Ethan Bullion, DO as Consulting Physician (Gastroenterology) Ethan Roup, MD as Consulting Physician (General Surgery) Ethan Merle, MD as Consulting Physician (Hematology) Alla Feeling, NP as Nurse Practitioner (Nurse Practitioner)  Date of Service:  10/07/2021  I connected with Ethan Martin on 10/07/2021 at 11:00 AM EST by telephone visit and verified that I am speaking with the correct person using two identifiers.  I discussed the limitations, risks, security and privacy concerns of performing an evaluation and management service by telephone and the availability of in person appointments. I also discussed with the patient that there may be a patient responsible charge related to this service. The patient expressed understanding and agreed to proceed.   Other persons participating in the visit and their role in the encounter:  none  Patient's location:  home Provider's location:  my office  CHIEF COMPLAINT: f/u of colon cancer  CURRENT THERAPY:  Adjuvant CAPOX, with Xeloda 1516m AM, 20047mPM 14 days on/7 days off, started 09/30/21  ASSESSMENT & PLAN:  GuHiroshi Martin a [age].o. male with   1. Toxicity Check: Nausea, Cold Sensitivity, Fatigue, Taste Change -secondary to oxaliplatin -he specifically notes difficulty swallowing warm/room temp liquids without gagging. He enjoys things such as egg drop soup and tea but has difficulty swallowing them (less physically difficult and more because it's "disgusting") -he notes his nausea is better controlled on compazine than zofran, but his nausea does not resolve on either medicine. I offered to give him dexamethasone to take after infusion, he would like to hold off for now. -we discussed possible side effects from Xeloda for the next week and what  to expect in his off week. -I encourage him to try ginger tea, ginger cancer for nausea, and try nutritional supplements such as ensure for his weight loss   2. 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  Baseline CEA normal, staging work-up was negative for distant metastasis -He underwent surgical resection 08/31/2021 by Dr. WhDema Severinpath showed: 4 cm invasive moderately differentiated adenocarcinoma invading into subserosa; metastatic carcinoma in 2/22 lymph nodes with lymphovascular invasion; positive radial margin.  There was no perineural invasion or perforation.   -I previously reviewed the role of adjuvant radiation therapy with him today, due to his positive surgical margin. I have discussed this with Drs. White and [place] recommendation is for CAPOX followed by radiation with Xeloda. -he began CAPOX today, with Xeloda 1500 mg in the morning, 2000 mg in the evening for 14 days on/7 days off, on 09/30/21. -her tolerated cycle 1 with some difficulty; he reports nausea, cold sensitivity, taste change, and fatigue.  -will reduce chemo dose for C2   3. Weight loss, constipation -He developed constipation in July - September 2022, secondary to #1 -He began losing weight when he began colonoscopy prep. Weight was up at last visit, will monitor on chemo. -He has up to 3 BM per day to complete, I encouraged him to continue MiraLAX, hydrate -he has established care with dietician.   4. Iron and B12 deficiency anemia -Secondary to #1 -Work-up 07/19/2021 showed B12 125, ferritin 6, hemoglobin 10 -He began oral  B12 and iron supplements, tolerating well -I previously recommend IV iron and B12 injections, he declined. -I advised him to increase his oral iron to BID  and his B12 to two tablets BID.   5. Family history -He has 1 healthy daughter age 57, we discussed beginning screening in her early 23s due to patient's young age at diagnosis -Given no other strong family history he likely does not need genetic testing   6. Health maintenance -I encouraged a healthy active lifestyle with normal diet, hydration, avoiding smoking, limiting alcohol -He is eager to resume exercise, will need clearance from [name] -He does not get flu or COVID vaccines, I reviewed infection precautions specially on chemo -Given the completely obstructing sigmoid colon mass, his colonoscopy in September was incomplete, he will need to follow-up with Dr. [name] for repeat/complete colonoscopy     PLAN: -lab, f/u, and C2 CAPOX in 2 weeks, will slightly reduce Oxaliplatin dose    No problem-specific Assessment & Plan notes found for this encounter.    SUMMARY OF ONCOLOGIC HISTORY: Oncology History  Malignant neoplasm of sigmoid colon ([place])  07/20/2021 Procedure   Diagnostic EGD by Dr. [name] - Normal esophagus. - Z-line regular, 40 cm from the incisors. - Normal stomach. Biopsied. - Normal examined duodenum. Biopsied. Colonoscopy - Hemorrhoids found on perianal exam. - Likely malignant completely obstructing tumor in the sigmoid colon. Biopsied. Tattooed. - Non-bleeding internal hemorrhoids.   07/20/2021 Initial Biopsy   Diagnosis 1. Duodenum, Biopsy - BENIGN DUODENAL MUCOSA - NO ACUTE INFLAMMATION, VILLOUS BLUNTING OR INCREASED INTRAEPITHELIAL LYMPHOCYTES 2. Stomach, biopsy - MILD CHRONIC GASTRITIS WITH REACTIVE CHANGES - NO H. PYLORI OR INTESTINAL METAPLASIA IDENTIFIED - SEE COMMENT 3. Sigmoid Colon Biopsy - ADENOCARCINOMA - SEE COMMENT Microscopic Comment 2. H. pylori immunohistochemistry is NEGATIVE for microorganisms.   07/20/2021 Tumor Marker   CEA normal 3.2   07/28/2021 Imaging   Staging CT CAP IMPRESSION: 1. Circumferential wall  thickening of the distal sigmoid colon, in keeping with primary colon malignancy identified by colonoscopy. 2. There is some nodularity in the adjacent mesocolon about the inferior mesenteric artery, as well as prominent subcentimeter lymph nodes, worrisome for early nodal or soft tissue metastatic disease. 3. No evidence of distant metastatic disease in the chest, abdomen, or pelvis.   08/31/2021 Cancer Staging   Staging form: Colon and Rectum, AJCC 8th Edition - Pathologic stage from 08/31/2021: Stage IIIB (pT3, pN1b, cM0) - Signed by Alla Feeling, NP on 09/19/2021 Stage prefix: Initial diagnosis Histologic grading system: 4 grade system Histologic grade (G): G2 Laterality: Left Lymph-vascular invasion (LVI): LVI present/identified, NOS Carcinoembryonic antigen (CEA) (ng/mL): 3.2 Perineural invasion (PNI): Absent    08/31/2021 Definitive Surgery   PREOP DIAGNOSIS: sigmoid colon cancer POSTOP DIAGNOSIS: Rectosigmoid colon cancer PROCEDURE:  Robotic assisted low anterior resection with double stapled colorectal anastomosis Intraoperative assessment of perfusion (ICG) Flexible sigmoidoscopy Bilateral transversus abdominus plane blocks SURGEON: Sharon Mt. Dema Severin, MD ASSISTANT: Leighton Ruff, MD   64/12/3293 Pathology Results   FINAL MICROSCOPIC DIAGNOSIS: A. COLON, RECTOSIGMOID, RESECTION: - Invasive moderately differentiated adenocarcinoma, 4 cm, involving distal sigmoid colon - Carcinoma invades into subserosa - Inked radial margin is positive for carcinoma - Metastatic carcinoma to two of twenty-two lymph nodes (2/22) - See oncology table B. FINAL DISTAL MARGIN: - Colonic donut, negative for carcinoma pT3pN1b, stage IIIb MMR IHC normal MSI-stable   09/19/2021 Initial Diagnosis   Malignant neoplasm of sigmoid colon ([place])   09/30/2021 -  Chemotherapy   Patient is on  Treatment Plan : COLORECTAL Xelox (Capeox) q21d        INTERVAL HISTORY:  Ethan Martin was contacted for  a follow up of colon cancer. He was last seen by me on 09/30/21.  He reports he experienced cold sensitivity, nausea, and diarrhea after chemo. He notes the nausea was more relieved by compazine. He denies any further diarrhea and skin toxicity. He also notes some taste changes and specifically reports difficulty drinking hot liquids. He also feels he has lost weight; he notes he does not have a scale but can tell by the fit of his clothes and by looking in the mirror.   All other systems were reviewed with the patient and are negative.  MEDICAL HISTORY:  Past Medical History:  Diagnosis Date   Arthritis    Cataract    GERD (gastroesophageal reflux disease)     SURGICAL HISTORY: Past Surgical History:  Procedure Laterality Date   FLEXIBLE SIGMOIDOSCOPY N/A 08/31/2021   Procedure: FLEXIBLE SIGMOIDOSCOPY;  Surgeon: Ethan Roup, MD;  Location: [facility];  Service: General;  Laterality: N/A;   NOSE SURGERY     WISDOM TOOTH EXTRACTION     1988    I have reviewed the social history and family history with the patient and they are unchanged from previous note.  ALLERGIES:  has No Known Allergies.  MEDICATIONS:  Current Outpatient Medications  Medication Sig Dispense Refill   capecitabine (XELODA) 500 MG tablet TAKE 3 TABLETS BY MOUTH IN THE  MORNING AND 4 TABLETS IN THE  EVENING  WITHIN 30 MINUTES AFTER MEALS FOR 14 DAYS ON THEN 7 DAYS OFF EVERY 21 DAYS 98 tablet 0   ferrous sulfate 325 (65 FE) MG EC tablet TAKE 1 TABLET (325 MG TOTAL) BY MOUTH 2 (TWO) TIMES DAILY. TAKE WITH VITAMIN C OR ORANGE JUICE PLEASE. TAKE IRON SUPPLEMENT 2 HOURS BEFORE OR 4 HOURS AFTER PPI OR OTHER ANTACIDS 30 tablet 6   ondansetron (ZOFRAN) 8 MG tablet Take 1 tablet (8 mg total) by mouth 2 (two) times daily as needed for refractory nausea / vomiting. Start on day 3 after chemotherapy. 30 tablet 1   prochlorperazine (COMPAZINE) 10 MG tablet Take 1 tablet (10 mg total) by mouth every 6 (six) hours as needed  (Nausea or vomiting). 30 tablet 1   vitamin B-12 (CYANOCOBALAMIN) 1000 MCG tablet Take 1 tablet (1,000 mcg total) by mouth daily. 30 tablet 6   No current facility-administered medications for this visit.    PHYSICAL EXAMINATION: ECOG PERFORMANCE STATUS: 1 - Symptomatic but completely ambulatory  There were no vitals filed for this visit. Wt Readings from Last 3 Encounters:  09/30/21 190 lb 4 oz (86.3 kg)  09/19/21 185 lb 1 oz (83.9 kg)  09/03/21 189 lb 2.5 oz (85.8 kg)     No vitals taken today, Exam not performed today  LABORATORY DATA:  I have reviewed the data as listed CBC Latest Ref Rng & Units 09/30/2021 09/02/2021 09/01/2021  WBC 4.0 - 10.5 K/uL 3.8(L) 8.9 11.4(H)  Hemoglobin 13.0 - 17.0 g/dL 10.8(L) 10.0(L) 9.6(L)  Hematocrit 39.0 - 52.0 % 35.5(L) 32.8(L) 31.3(L)  Platelets 150 - 400 K/uL 277 316 330     CMP Latest Ref Rng & Units 09/30/2021 09/02/2021 09/01/2021  Glucose 70 - 99 mg/dL 102(H) 87 101(H)  BUN 6 - 20 mg/dL _0 Creatinine 0.61 - 1.24 mg/dL 1.01 0.74 0.91  Sodium 135 - 145 mmol/L 142 137 137  Potassium 3.5 - 5.1 mmol/L  3.6 4.0 4.1  Chloride 98 - 111 mmol/L 107 101 101  CO2 22 - 32 mmol/L _0 Calcium 8.9 - 10.3 mg/dL 8.6(L) 8.5(L) 8.7(L)  Total Protein 6.5 - 8.1 g/dL 7.7 - -  Total Bilirubin 0.3 - 1.2 mg/dL 0.4 - -  Alkaline Phos 38 - 126 U/L 87 - -  AST 15 - 41 U/L 12(L) - -  ALT 0 - 44 U/L 7 - -      RADIOGRAPHIC STUDIES: I have personally reviewed the radiological images as listed and agreed with the findings in the report. No results found.    No orders of the defined types were placed in this encounter.  All questions were answered. The patient knows to call the clinic with any problems, questions or concerns. No barriers to learning was detected. The total time spent in the appointment was 15 minutes.     Ethan Merle, MD 10/07/2021   I, Wilburn Mylar, am acting as scribe for Ethan Merle, MD.   I have reviewed the above  documentation for accuracy and completeness, and I agree with the above.

## 2021-10-18 ENCOUNTER — Telehealth: Payer: Self-pay

## 2021-10-18 NOTE — Telephone Encounter (Signed)
-----   Message from [place], DO sent at 10/18/2021 11:47 AM EST ----- Regarding: FW: Follow-up colonoscopy Please schedule this patient for OV with me to discuss optimal timing for repeat colonoscopy.  Thank you. ----- Message ----- From: Ileana Roup, MD Sent: 10/11/2021  10:17 AM EST To: Lavena Bullion, DO Subject: Follow-up colonoscopy                          Ines Bloomer - this gentleman had near obstructing mass in sigmoid. He has since had surgery and is now on postop/adjuvant chemotherapy. He will need completion colonoscopy I presume after he completes systemic treatment here soon. Can you see him back? Appreciate your help!  Gerald Stabs

## 2021-10-18 NOTE — Telephone Encounter (Signed)
LVM for patient to call back and mychart message sent 

## 2021-10-19 NOTE — Telephone Encounter (Signed)
Scheduled 11/7

## 2021-10-21 ENCOUNTER — Other Ambulatory Visit: Payer: Self-pay

## 2021-10-21 ENCOUNTER — Inpatient Hospital Stay (HOSPITAL_BASED_OUTPATIENT_CLINIC_OR_DEPARTMENT_OTHER): Payer: 59 | Admitting: Hematology

## 2021-10-21 ENCOUNTER — Inpatient Hospital Stay: Payer: 59

## 2021-10-21 ENCOUNTER — Encounter: Payer: Self-pay | Admitting: Hematology

## 2021-10-21 VITALS — BP 137/74 | HR 74 | Temp 98.1°F | Resp 18 | Ht 73.0 in | Wt 188.4 lb

## 2021-10-21 DIAGNOSIS — Z5111 Encounter for antineoplastic chemotherapy: Secondary | ICD-10-CM | POA: Diagnosis not present

## 2021-10-21 DIAGNOSIS — C187 Malignant neoplasm of sigmoid colon: Secondary | ICD-10-CM

## 2021-10-21 LAB — CMP (CANCER CENTER ONLY)
ALT: 7 U/L (ref 0–44)
AST: 14 U/L — ABNORMAL LOW (ref 15–41)
Albumin: 3.8 g/dL (ref 3.5–5.0)
Alkaline Phosphatase: 71 U/L (ref 38–126)
Anion gap: 7 (ref 5–15)
BUN: 7 mg/dL (ref 6–20)
CO2: 29 mmol/L (ref 22–32)
Calcium: 9.1 mg/dL (ref 8.9–10.3)
Chloride: 106 mmol/L (ref 98–111)
Creatinine: 0.81 mg/dL (ref 0.61–1.24)
GFR, Estimated: >60 mL/min (ref >60)
Glucose, Bld: 111 mg/dL — ABNORMAL HIGH (ref 70–99)
Potassium: 3.6 mmol/L (ref 3.5–5.1)
Sodium: 142 mmol/L (ref 135–145)
Total Bilirubin: 0.4 mg/dL (ref 0.3–1.2)
Total Protein: 7.6 g/dL (ref 6.5–8.1)

## 2021-10-21 LAB — CBC WITH DIFFERENTIAL (CANCER CENTER ONLY)
Abs Immature Granulocytes: 0.01 10*3/uL (ref 0.00–0.07)
Basophils Absolute: 0 10*3/uL (ref 0.0–0.1)
Basophils Relative: 1 %
Eosinophils Absolute: 0.1 10*3/uL (ref 0.0–0.5)
Eosinophils Relative: 2 %
HCT: 36.6 % — ABNORMAL LOW (ref 39.0–52.0)
Hemoglobin: 11.5 g/dL — ABNORMAL LOW (ref 13.0–17.0)
Immature Granulocytes: 0 %
Lymphocytes Relative: 43 %
Lymphs Abs: 1.7 10*3/uL (ref 0.7–4.0)
MCH: 24.4 pg — ABNORMAL LOW (ref 26.0–34.0)
MCHC: 31.4 g/dL (ref 30.0–36.0)
MCV: 77.5 fL — ABNORMAL LOW (ref 80.0–100.0)
Monocytes Absolute: 0.5 10*3/uL (ref 0.1–1.0)
Monocytes Relative: 13 %
Neutro Abs: 1.6 10*3/uL — ABNORMAL LOW (ref 1.7–7.7)
Neutrophils Relative %: 41 %
Platelet Count: 306 10*3/uL (ref 150–400)
RBC: 4.72 MIL/uL (ref 4.22–5.81)
RDW: 17.5 % — ABNORMAL HIGH (ref 11.5–15.5)
WBC Count: 3.9 10*3/uL — ABNORMAL LOW (ref 4.0–10.5)
nRBC: 0 % (ref 0.0–0.2)

## 2021-10-21 MED ORDER — SODIUM CHLORIDE 0.9 % IV SOLN
10.0000 mg | Freq: Once | INTRAVENOUS | Status: AC
  Administered 2021-10-21: 14:00:00 10 mg via INTRAVENOUS
  Filled 2021-10-21: qty 10

## 2021-10-21 MED ORDER — PROCHLORPERAZINE MALEATE 10 MG PO TABS: 10.0000 mg | ORAL_TABLET | Freq: Four times a day (QID) | ORAL | 2 refills | Status: DC | PRN

## 2021-10-21 MED ORDER — PALONOSETRON HCL INJECTION 0.25 MG/5ML
0.2500 mg | Freq: Once | INTRAVENOUS | Status: AC
  Administered 2021-10-21: 14:00:00 0.25 mg via INTRAVENOUS
  Filled 2021-10-21: qty 5

## 2021-10-21 MED ORDER — OXALIPLATIN CHEMO INJECTION 100 MG/20ML
110.0000 mg/m2 | Freq: Once | INTRAVENOUS | Status: AC
  Administered 2021-10-21: 15:00:00 230 mg via INTRAVENOUS
  Filled 2021-10-21: qty 40

## 2021-10-21 MED ORDER — DEXTROSE 5 % IV SOLN: Freq: Once | INTRAVENOUS | Status: AC

## 2021-10-21 NOTE — Patient Instructions (Signed)
[facility] MEDICAL ONCOLOG[facility]   ?Discharge Instructions: ?Thank you for choosing [facility] to provide your oncology and hematology care.  ? ?If you have a lab appointment with the Cancer Center, please go directly to the Cancer Center and check in at the registration area. ?  ?Wear comfortable clothing and clothing appropriate for easy access to any Portacath or PICC line.  ? ?We strive to give you quality time with your provider. You may need to reschedule your appointment if you arrive late (15 or more minutes).  Arriving late affects you and other patients whose appointments are after yours.  Also, if you miss three or more appointments without notifying the office, you may be dismissed from the clinic at the provider?s discretion.    ?  ?For prescription refill requests, have your pharmacy contact our office and allow 72 hours for refills to be completed.   ? ?Today you received the following chemotherapy and/or immunotherapy agents: oxaliplatin    ?  ?To help prevent nausea and vomiting after your treatment, we encourage you to take your nausea medication as directed. ? ?BELOW ARE SYMPTOMS THAT SHOULD BE REPORTED IMMEDIATELY: ?*FEVER GREATER THAN 100.4 F (38 ?C) OR HIGHER ?*CHILLS OR SWEATING ?*NAUSEA AND VOMITING THAT IS NOT CONTROLLED WITH YOUR NAUSEA MEDICATION ?*UNUSUAL SHORTNESS OF BREATH ?*UNUSUAL BRUISING OR BLEEDING ?*URINARY PROBLEMS (pain or burning when urinating, or frequent urination) ?*BOWEL PROBLEMS (unusual diarrhea, constipation, pain near the anus) ?TENDERNESS IN MOUTH AND THROAT WITH OR WITHOUT PRESENCE OF ULCERS (sore throat, sores in mouth, or a toothache) ?UNUSUAL RASH, SWELLING OR PAIN  ?UNUSUAL VAGINAL DISCHARGE OR ITCHING  ? ?Items with * indicate a potential emergency and should be followed up as soon as possible or go to the Emergency Department if any problems should occur. ? ?Please show the CHEMOTHERAPY ALERT CARD or IMMUNOTHERAPY ALERT CARD at check-in  to the Emergency Department and triage nurse. ? ?Should you have questions after your visit or need to cancel or reschedule your appointment, please contact [facility]  Dept: [phone]  and follow the prompts.  Office hours are 8:00 a.m. to 4:30 p.m. Monday - Friday. Please note that voicemails left after 4:00 p.m. may not be returned until the following business day.  We are closed weekends and major holidays. You have access to a nurse at all times for urgent questions. Please call the main number to the clinic Dept: [phone] and follow the prompts. ? ? ?For any non-urgent questions, you may also contact your provider using MyChart. We now offer e-Visits for anyone 18 and older to request care online for non-urgent symptoms. For details visit [email or web address]. ?  ?Also download the MyChart app! Go to the app store, search "MyChart", open the app, select [facility], and log in with your MyChart username and password. ? ?Due to Covid, a mask is required upon entering the hospital/clinic. If you do not have a mask, one will be given to you upon arrival. For doctor visits, patients may have 1 support person aged 18 or older with them. For treatment visits, patients cannot have anyone with them due to current Covid guidelines and our immunocompromised population.  ? ?

## 2021-10-21 NOTE — Progress Notes (Signed)
[place]   [phone] [phone]) [phone]   Clinic Follow up Note   Patient Care Team: Patient, No Pcp Per (Inactive) as PCP - General (General Practice) Lavena Bullion, DO as Consulting Physician (Gastroenterology) Ileana Roup, MD as Consulting Physician (General Surgery) Truitt Merle, MD as Consulting Physician (Hematology) Alla Feeling, NP as Nurse Practitioner (Nurse Practitioner)  Date of Service:  10/21/2021  CHIEF COMPLAINT: f/u of colon cancer  CURRENT THERAPY:  Adjuvant CAPOX, with Xeloda 1591m AM, 20056mPM 14 days on/7 days off, started 09/30/21  ASSESSMENT & PLAN:  Ethan Sudberrys a [age].o. male with   1. 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  Baseline CEA normal, staging work-up was negative for distant metastasis -He underwent surgical resection 08/31/2021 by Dr. WhDema Severinpath showed: 4 cm invasive moderately differentiated adenocarcinoma invading into subserosa; metastatic carcinoma in 2/22 lymph nodes with lymphovascular invasion; positive radial margin.  There was no perineural invasion or perforation.   -I previously reviewed the role of adjuvant radiation therapy with him today, due to his positive surgical margin. I have discussed this with Drs. White and MoChinleThe recommendation is for CAPOX followed by radiation with Xeloda. -he began CAPOX today, with Xeloda 1500 mg in the morning, 2000 mg in the evening for 14 days on/7 days off, on 09/30/21. -her tolerated cycle 1 with some difficulty; he reports nausea, cold sensitivity, taste change, and fatigue.  -labs reviewed, overall stable. We will reduce chemo dose today due to side effects.  2. Symptom Management: Nausea, Cold Sensitivity,  Fatigue, Taste Change -secondary to oxaliplatin -he reports the cold sensitivity lasted about 6-7 days, which affected his taste and ability to tolerate swallowing. -he notes his nausea is better controlled on compazine than zofran, but his nausea does not resolve on either medicine.  -resolved now and recovered well    3. Weight loss, constipation -He developed constipation in July - September 2022, secondary to #1 -He began losing weight when he began colonoscopy prep. Weight was up at last visit, will monitor on chemo. -He has up to 3 BM per day to complete, I encouraged him to continue MiraLAX, hydrate -he has established care with dietician.   4. Iron and B12 deficiency anemia -Secondary to #1 -Work-up 07/19/2021 showed B12 125, ferritin 6, hemoglobin 10 -He began oral B12 and iron supplements, tolerating well -I previously recommend IV iron and B12 injections, he declined. -I advised him to increase his oral iron to BID and his B12 to two tablets BID. -hgb up to 11.5 today (10/21/21)   5. Family history -He has 1 healthy daughter age 7764we discussed beginning screening in her early 89sue to patient's young age at diagnosis -Given no other strong family history he likely does not need genetic testing   6. Health maintenance -I encouraged a healthy active lifestyle with normal diet, hydration, avoiding smoking, limiting alcohol -He does not get flu or COVID vaccines, I reviewed infection precautions especially on chemo -Given the completely obstructing sigmoid colon mass, his colonoscopy in September was incomplete, he will need to follow-up with Dr. CiBryan Lemmaor repeat/complete colonoscopy     PLAN: -proceed with C2 CAPOX today, with slightly reduced Oxaliplatin dose to 11056m2 due to poor tolerance to first cycle  -he will start Xeloda  today at same dose for 14 days  -I refilled compazine -lab, f/u, and CAPOX in 3 weeks   No problem-specific Assessment & Plan notes  found for this encounter.   SUMMARY OF ONCOLOGIC HISTORY: Oncology History  Malignant neoplasm of sigmoid colon ([place])  07/20/2021 Procedure   Diagnostic EGD by Dr. [name] - Normal esophagus. - Z-line regular, 40 cm from the incisors. - Normal stomach. Biopsied. - Normal examined duodenum. Biopsied. Colonoscopy - Hemorrhoids found on perianal exam. - Likely malignant completely obstructing tumor in the sigmoid colon. Biopsied. Tattooed. - Non-bleeding internal hemorrhoids.   07/20/2021 Initial Biopsy   Diagnosis 1. Duodenum, Biopsy - BENIGN DUODENAL MUCOSA - NO ACUTE INFLAMMATION, VILLOUS BLUNTING OR INCREASED INTRAEPITHELIAL LYMPHOCYTES 2. Stomach, biopsy - MILD CHRONIC GASTRITIS WITH REACTIVE CHANGES - NO H. PYLORI OR INTESTINAL METAPLASIA IDENTIFIED - SEE COMMENT 3. Sigmoid Colon Biopsy - ADENOCARCINOMA - SEE COMMENT Microscopic Comment 2. H. pylori immunohistochemistry is NEGATIVE for microorganisms.   07/20/2021 Tumor Marker   CEA normal 3.2   07/28/2021 Imaging   Staging CT CAP IMPRESSION: 1. Circumferential wall thickening of the distal sigmoid colon, in keeping with primary colon malignancy identified by colonoscopy. 2. There is some nodularity in the adjacent mesocolon about the inferior mesenteric artery, as well as prominent subcentimeter lymph nodes, worrisome for early nodal or soft tissue metastatic disease. 3. No evidence of distant metastatic disease in the chest, abdomen, or pelvis.   08/31/2021 Cancer Staging   Staging form: Colon and Rectum, AJCC 8th Edition - Pathologic stage from 08/31/2021: Stage IIIB (pT3, pN1b, cM0) - Signed by Alla Feeling, NP on 09/19/2021 Stage prefix: Initial diagnosis Histologic grading system: 4 grade system Histologic grade (G): G2 Laterality: Left Lymph-vascular invasion (LVI): LVI present/identified, NOS Carcinoembryonic antigen (CEA) (ng/mL): 3.2 Perineural invasion (PNI): Absent    08/31/2021 Definitive Surgery    PREOP DIAGNOSIS: sigmoid colon cancer POSTOP DIAGNOSIS: Rectosigmoid colon cancer PROCEDURE:  Robotic assisted low anterior resection with double stapled colorectal anastomosis Intraoperative assessment of perfusion (ICG) Flexible sigmoidoscopy Bilateral transversus abdominus plane blocks SURGEON: Sharon Mt. Dema Severin, MD ASSISTANT: Leighton Ruff, MD   00/06/3817 Pathology Results   FINAL MICROSCOPIC DIAGNOSIS: A. COLON, RECTOSIGMOID, RESECTION: - Invasive moderately differentiated adenocarcinoma, 4 cm, involving distal sigmoid colon - Carcinoma invades into subserosa - Inked radial margin is positive for carcinoma - Metastatic carcinoma to two of twenty-two lymph nodes (2/22) - See oncology table B. FINAL DISTAL MARGIN: - Colonic donut, negative for carcinoma pT3pN1b, stage IIIb MMR IHC normal MSI-stable   09/19/2021 Initial Diagnosis   Malignant neoplasm of sigmoid colon ([place])   09/30/2021 -  Chemotherapy   Patient is on Treatment Plan : COLORECTAL Xelox (Capeox) q21d        INTERVAL HISTORY:  Birney Belshe is here for a follow up of colon cancer. He was last seen by me on 09/30/21, with phone toxicity check in the interim. He presents to the clinic alone. He expressed frustration with the timing of his appointments today, despite previously agreeing to the time. I advised him to keep an eye on his appointments on MyChart and to call or message [place] with any concerns or conflicts. He understands.   All other systems were reviewed with the patient and are negative.  MEDICAL HISTORY:  Past Medical History:  Diagnosis Date   Arthritis    Cataract    GERD (gastroesophageal reflux disease)     SURGICAL HISTORY: Past Surgical History:  Procedure Laterality Date   FLEXIBLE SIGMOIDOSCOPY N/A  08/31/2021   Procedure: FLEXIBLE SIGMOIDOSCOPY;  Surgeon: Ileana Roup, MD;  Location: [facility];  Service: General;  Laterality: N/A;   NOSE SURGERY     WISDOM TOOTH EXTRACTION     1988     I have reviewed the social history and family history with the patient and they are unchanged from previous note.  ALLERGIES:  has No Known Allergies.  MEDICATIONS:  Current Outpatient Medications  Medication Sig Dispense Refill   capecitabine (XELODA) 500 MG tablet TAKE 3 TABLETS BY MOUTH IN THE  MORNING AND 4 TABLETS IN THE  EVENING  WITHIN 30 MINUTES AFTER MEALS FOR 14 DAYS ON THEN 7 DAYS OFF EVERY 21 DAYS 98 tablet 0   ferrous sulfate 325 (65 FE) MG EC tablet TAKE 1 TABLET (325 MG TOTAL) BY MOUTH 2 (TWO) TIMES DAILY. TAKE WITH VITAMIN C OR ORANGE JUICE PLEASE. TAKE IRON SUPPLEMENT 2 HOURS BEFORE OR 4 HOURS AFTER PPI OR OTHER ANTACIDS 30 tablet 6   ondansetron (ZOFRAN) 8 MG tablet Take 1 tablet (8 mg total) by mouth 2 (two) times daily as needed for refractory nausea / vomiting. Start on day 3 after chemotherapy. 30 tablet 1   prochlorperazine (COMPAZINE) 10 MG tablet Take 1 tablet (10 mg total) by mouth every 6 (six) hours as needed (Nausea or vomiting). 30 tablet 2   vitamin B-12 (CYANOCOBALAMIN) 1000 MCG tablet Take 1 tablet (1,000 mcg total) by mouth daily. 30 tablet 6   No current facility-administered medications for this visit.   Facility-Administered Medications Ordered in Other Visits  Medication Dose Route Frequency Provider Last Rate Last Admin   oxaliplatin (ELOXATIN) 230 mg in dextrose 5 % 500 mL chemo infusion  110 mg/m2 (Treatment Plan Recorded) Intravenous Once Truitt Merle, MD 273 mL/hr at 10/21/21 1437 230 mg at 10/21/21 1437    PHYSICAL EXAMINATION: ECOG PERFORMANCE STATUS: 1 - Symptomatic but completely ambulatory  Vitals:   10/21/21 1141  BP: 137/74  Pulse: 74  Resp: 18  Temp: 98.1 F (36.7 C)  SpO2: 100%   Wt Readings from Last 3 Encounters:  10/21/21 188 lb 6.4 oz (85.5 kg)  09/30/21 190 lb 4 oz (86.3 kg)  09/19/21 185 lb 1 oz (83.9 kg)     GENERAL:alert, no distress and comfortable SKIN: skin color normal, no rashes or significant lesions EYES:  normal, Conjunctiva are pink and non-injected, sclera clear  NEURO: alert & oriented x 3 with fluent speech  LABORATORY DATA:  I have reviewed the data as listed CBC Latest Ref Rng & Units 10/21/2021 09/30/2021 09/02/2021  WBC 4.0 - 10.5 K/uL 3.9(L) 3.8(L) 8.9  Hemoglobin 13.0 - 17.0 g/dL 11.5(L) 10.8(L) 10.0(L)  Hematocrit 39.0 - 52.0 % 36.6(L) 35.5(L) 32.8(L)  Platelets 150 - 400 K/uL 306 277 316     CMP Latest Ref Rng & Units 10/21/2021 09/30/2021 09/02/2021  Glucose 70 - 99 mg/dL 111(H) 102(H) 87  BUN 6 - 20 mg/dL 7 8 11   Creatinine 0.61 - 1.24 mg/dL 0.81 1.01 0.74  Sodium 135 - 145 mmol/L 142 142 137  Potassium 3.5 - 5.1 mmol/L 3.6 3.6 4.0  Chloride 98 - 111 mmol/L 106 107 101  CO2 22 - 32 mmol/L 29 25 28   Calcium 8.9 - 10.3 mg/dL 9.1 8.6(L) 8.5(L)  Total Protein 6.5 - 8.1 g/dL 7.6 7.7 -  Total Bilirubin 0.3 - 1.2 mg/dL 0.4 0.4 -  Alkaline Phos 38 - 126 U/L 71 87 -  AST 15 - 41 U/L 14(L) 12(L) -  ALT 0 - 44 U/L 7 7 -      RADIOGRAPHIC STUDIES: I have personally reviewed the radiological images as listed and agreed with the findings in the report. No results found.    No orders of the defined types were placed in this encounter.  All questions were answered. The patient knows to call the clinic with any problems, questions or concerns. No barriers to learning was detected. The total time spent in the appointment was 30 minutes.     Truitt Merle, MD 10/21/2021   I, Wilburn Mylar, am acting as scribe for Truitt Merle, MD.   I have reviewed the above documentation for accuracy and completeness, and I agree with the above.

## 2021-10-23 DIAGNOSIS — D649 Anemia, unspecified: Secondary | ICD-10-CM

## 2021-10-23 HISTORY — DX: Anemia, unspecified: D64.9

## 2021-10-25 ENCOUNTER — Telehealth: Payer: Self-pay | Admitting: General Surgery

## 2021-10-25 NOTE — Telephone Encounter (Signed)
The patient is already scheduled for 11/08/2021 to come in for an office visit.

## 2021-10-25 NOTE — Telephone Encounter (Signed)
----- Message from [place], DO sent at 10/25/2021 10:54 AM EST ----- Regarding: FW: Follow-up colonoscopy Please set this patient up for colonoscopy after completion of chemotherapy, but before start of radiation. Should be early March that he finishes chemo.   Thanks.   ----- Message ----- From: Truitt Merle, MD Sent: 10/25/2021   8:38 AM EST To: Ileana Roup, MD, Lavena Bullion, DO Subject: RE: Follow-up colonoscopy                      That is fine with me, thanks   Krista Blue  ----- Message ----- From: Lavena Bullion, DO Sent: 10/25/2021   8:13 AM EST To: Ileana Roup, MD, Truitt Merle, MD Subject: RE: Follow-up colonoscopy                      We can make the colonoscopy happen with that timeline, but would it be preferable that I just postpone until after completion of chemotherapy course but just before radiation (should complete chemo early March, right?)?   Vito  ----- Message ----- From: Truitt Merle, MD Sent: 10/19/2021   5:58 PM EST To: Ileana Roup, MD, Lavena Bullion, DO Subject: RE: Follow-up colonoscopy                      Thanks for keeping me in the loop. Yes he started adjuvant chemo CAPOX on 12/9. Plan is for 3 months chemo, then radiation for 5-6 weeks. I think it's OK to do colonoscopy in Jan, as long as it's the week before chemo (1/16-1/19 or 2/6-2/9). If that does not work for you, the week after that is also OK and we will postpone chemo for a week.   Thanks   Krista Blue  ----- Message ----- From: Ileana Roup, MD Sent: 10/18/2021   4:14 PM EST To: Truitt Merle, MD, Lavena Bullion, DO Subject: RE: Follow-up colonoscopy                      Hes getting postop chemo at this juncture. I think from a surgical perspective he would be fine at the 3 month mark. Im not sure when his chemo will be completed but have added Krista Blue to the conversation ----- Message ----- From: Lavena Bullion, DO Sent: 10/18/2021   4:10 PM EST To:  Ileana Roup, MD Subject: RE: Follow-up colonoscopy                      And no issue from a post op timing/healing standpoint to get him scheduled for January?   ----- Message ----- From: Ileana Roup, MD Sent: 10/18/2021   2:56 PM EST To: Lavena Bullion, DO Subject: RE: Follow-up colonoscopy                      Thanks a bunch! ----- Message ----- From: Lavena Bullion, DO Sent: 10/18/2021  11:47 AM EST To: Ileana Roup, MD Subject: RE: Follow-up colonoscopy                      Absolutely! Will get him in for f/u and scheduling of completion colonoscopy.   Thanks for taking care of him!!  Luanna Salk ----- Message ----- From: Ileana Roup, MD Sent: 10/11/2021  10:17 AM EST To: Lavena Bullion, DO Subject: Follow-up colonoscopy  Ines Bloomer - this gentleman had near obstructing mass in sigmoid. He has since had surgery and is now on postop/adjuvant chemotherapy. He will need completion colonoscopy I presume after he completes systemic treatment here soon. Can you see him back? Appreciate your help!  Gerald Stabs

## 2021-10-29 ENCOUNTER — Other Ambulatory Visit: Payer: Self-pay | Admitting: Hematology

## 2021-10-29 DIAGNOSIS — C187 Malignant neoplasm of sigmoid colon: Secondary | ICD-10-CM

## 2021-11-08 ENCOUNTER — Encounter: Payer: Self-pay | Admitting: Gastroenterology

## 2021-11-08 ENCOUNTER — Other Ambulatory Visit: Payer: Self-pay

## 2021-11-08 ENCOUNTER — Ambulatory Visit (INDEPENDENT_AMBULATORY_CARE_PROVIDER_SITE_OTHER): Payer: 59 | Admitting: Gastroenterology

## 2021-11-08 VITALS — BP 144/80 | HR 78 | Ht 73.0 in | Wt 190.2 lb

## 2021-11-08 DIAGNOSIS — Z85038 Personal history of other malignant neoplasm of large intestine: Secondary | ICD-10-CM | POA: Diagnosis not present

## 2021-11-08 MED ORDER — CLENPIQ 10-3.5-12 MG-GM -GM/160ML PO SOLN: 1.0000 | ORAL | 0 refills | Status: DC

## 2021-11-08 NOTE — Progress Notes (Deleted)
Established Patient Office Visit  Subjective:  Patient ID: Ethan Martin, male    DOB: 1965/11/23  Age: 56 y.o. MRN: [id number]  CC:  Chief Complaint  Patient presents with   [facility] Colonoscopy     Still on chemothrapy    HPI Ethan Martin presents for ***  Past Medical History:  Diagnosis Date   Arthritis    Cancer ([place])    Cataract    GERD (gastroesophageal reflux disease)     Past Surgical History:  Procedure Laterality Date   FLEXIBLE SIGMOIDOSCOPY N/A 08/31/2021   Procedure: FLEXIBLE SIGMOIDOSCOPY;  Surgeon: Ileana Roup, MD;  Location: [facility];  Service: General;  Laterality: N/A;   NOSE SURGERY     WISDOM TOOTH EXTRACTION     1988    Family History  Problem Relation Age of Onset   Healthy Mother    Healthy Father    Cancer Maternal Aunt        unknown type   Cancer Paternal Aunt        breast   Colon cancer Neg Hx    Esophageal cancer Neg Hx    Rectal cancer Neg Hx    Stomach cancer Neg Hx     Social History   Socioeconomic History   Marital status: Divorced    Spouse name: Not on file   Number of children: Not on file   Years of education: Not on file   Highest education level: Not on file  Occupational History   Not on file  Tobacco Use   Smoking status: Never   Smokeless tobacco: Never  Vaping Use   Vaping Use: Never used  Substance and Sexual Activity   Alcohol use: Not Currently    Alcohol/week: 5.0 - 6.0 standard drinks    Types: 2 Cans of beer, 3 - 4 Shots of liquor per week    Comment: drinks 2-3 times per week   Drug use: Not Currently   Sexual activity: Not on file  Other Topics Concern   Not on file  Social History Narrative   Not on file   Social Determinants of Health   Financial Resource Strain: Not on file  Food Insecurity: Not on file  Transportation Needs: Not on file  Physical Activity: Not on file  Stress: Not on file  Social Connections: Not on file  Intimate Partner Violence: Not on file    Outpatient  Medications Prior to Visit  Medication Sig Dispense Refill   capecitabine (XELODA) 500 MG tablet TAKE 3 TABLETS BY MOUTH IN THE  MORNING AND 4 TABLETS BY MOUTH  IN THE EVENING WITHIN 30 MINUTES AFTER A MEAL FOR 14 DAYS ON THEN 7 DAYS OFF EVERY 21 DAYS 98 tablet 0   ferrous sulfate 325 (65 FE) MG EC tablet TAKE 1 TABLET (325 MG TOTAL) BY MOUTH 2 (TWO) TIMES DAILY. TAKE WITH VITAMIN C OR ORANGE JUICE PLEASE. TAKE IRON SUPPLEMENT 2 HOURS BEFORE OR 4 HOURS AFTER PPI OR OTHER ANTACIDS 30 tablet 6   ondansetron (ZOFRAN) 8 MG tablet Take 1 tablet (8 mg total) by mouth 2 (two) times daily as needed for refractory nausea / vomiting. Start on day 3 after chemotherapy. 30 tablet 1   prochlorperazine (COMPAZINE) 10 MG tablet Take 1 tablet (10 mg total) by mouth every 6 (six) hours as needed (Nausea or vomiting). 30 tablet 2   vitamin B-12 (CYANOCOBALAMIN) 1000 MCG tablet Take 1 tablet (1,000 mcg total) by mouth daily. 30 tablet 6  No facility-administered medications prior to visit.    No Known Allergies  ROS Review of Systems    Objective:    Physical Exam  BP (!) 144/80    Pulse 78    Ht 6\' 1"  (1.854 m)    Wt 190 lb 4 oz (86.3 kg)    BMI 25.10 kg/m  Wt Readings from Last 3 Encounters:  11/08/21 190 lb 4 oz (86.3 kg)  10/21/21 188 lb 6.4 oz (85.5 kg)  09/30/21 190 lb 4 oz (86.3 kg)     Health Maintenance Due  Topic Date Due   COVID-19 Vaccine (1) Never done   Pneumococcal Vaccine 83-62 Years old (1 - PCV) Never done   HIV Screening  Never done   Hepatitis C Screening  Never done   TETANUS/TDAP  Never done   Zoster Vaccines- Shingrix (1 of 2) Never done   INFLUENZA VACCINE  Never done    There are no preventive care reminders to display for this patient.  Lab Results  Component Value Date   TSH 0.99 07/15/2021   Lab Results  Component Value Date   WBC 3.9 (L) 10/21/2021   HGB 11.5 (L) 10/21/2021   HCT 36.6 (L) 10/21/2021   MCV 77.5 (L) 10/21/2021   PLT 306 10/21/2021   Lab  Results  Component Value Date   NA 142 10/21/2021   K 3.6 10/21/2021   CO2 29 10/21/2021   GLUCOSE 111 (H) 10/21/2021   BUN 7 10/21/2021   CREATININE 0.81 10/21/2021   BILITOT 0.4 10/21/2021   ALKPHOS 71 10/21/2021   AST 14 (L) 10/21/2021   ALT 7 10/21/2021   PROT 7.6 10/21/2021   ALBUMIN 3.8 10/21/2021   CALCIUM 9.1 10/21/2021   ANIONGAP 7 10/21/2021   No results found for: CHOL No results found for: HDL No results found for: LDLCALC No results found for: TRIG No results found for: CHOLHDL No results found for: HGBA1C    Assessment & Plan:   Problem List Items Addressed This Visit   None   No orders of the defined types were placed in this encounter.   Follow-up: No follow-ups on file.    Lavena Bullion, DO

## 2021-11-08 NOTE — Progress Notes (Signed)
Chief Complaint:    Colon cancer  GI History: 56 year old male with moderately differentiated adenocarcinoma of sigmoid colon (grade 2, T3pN1bM0, stage IIIb, MMR normal, MSI stable).  Has iron deficiency and B12 deficiency related to his malignancy. - 07/22/2021: Colonoscopy with nontraversable mass in sigmoid colon, located 32 cm from anal verge.  Biopsies confirmed adenocarcinoma.  CEA 3.2. - 07/29/2021: CT C/A/P: Circumferential wall thickening of distal sigmoid colon.  Some nodularity and adjacent mesocolon around the IMA as well as prominent subcentimeter lymph nodes, worrisome for early nodal or soft tissue metastatic disease.  No evidence of distant metastatic disease. - 08/31/2021: Surgical resection by [name] (robotic assisted LAR with anastomosis at 18 cm) with metastatic carcinoma in 2/22 lymph nodes with lymphovascular invasion.  Question of positive surgical margin, but dona ("final distal margin") negative for carcinoma. - Evaluated by Dr. Burr Medico in hematology/oncology clinic.  Started CAPOX with Xeloda x3 months, then will treat with radiation x5-6 weeks  HPI:     Patient is a 56 y.o. male presenting to the Gastroenterology Clinic for follow-up and discuss completion colonoscopy.  Initial colonoscopy 06/2021 with nontraversable mass in the sigmoid colon.  Has since undergone surgical resection, started chemotherapy, with plan to initiate radiation in March as outlined above.  Scheduled to complete chemotherapy in early March  Has appt with Dr. Lisbeth Renshaw in March to discuss Radiation.    Review of systems:     No chest pain, no SOB, no fevers, no urinary sx   Past Medical History:  Diagnosis Date   Arthritis    Cancer ([place])    Cataract    GERD (gastroesophageal reflux disease)     Patient's surgical history, family medical history, social history, medications and allergies were all reviewed in Epic    Current Outpatient Medications  Medication Sig Dispense Refill    capecitabine (XELODA) 500 MG tablet TAKE 3 TABLETS BY MOUTH IN THE  MORNING AND 4 TABLETS BY MOUTH  IN THE EVENING WITHIN 30 MINUTES AFTER A MEAL FOR 14 DAYS ON THEN 7 DAYS OFF EVERY 21 DAYS 98 tablet 0   ferrous sulfate 325 (65 FE) MG EC tablet TAKE 1 TABLET (325 MG TOTAL) BY MOUTH 2 (TWO) TIMES DAILY. TAKE WITH VITAMIN C OR ORANGE JUICE PLEASE. TAKE IRON SUPPLEMENT 2 HOURS BEFORE OR 4 HOURS AFTER PPI OR OTHER ANTACIDS 30 tablet 6   ondansetron (ZOFRAN) 8 MG tablet Take 1 tablet (8 mg total) by mouth 2 (two) times daily as needed for refractory nausea / vomiting. Start on day 3 after chemotherapy. 30 tablet 1   prochlorperazine (COMPAZINE) 10 MG tablet Take 1 tablet (10 mg total) by mouth every 6 (six) hours as needed (Nausea or vomiting). 30 tablet 2   vitamin B-12 (CYANOCOBALAMIN) 1000 MCG tablet Take 1 tablet (1,000 mcg total) by mouth daily. 30 tablet 6   No current facility-administered medications for this visit.    Physical Exam:     BP (!) 144/80    Pulse 78    Ht 6' 1"  (1.854 m)    Wt 190 lb 4 oz (86.3 kg)    BMI 25.10 kg/m   GENERAL:  Pleasant man in NAD PSYCH: : Cooperative, normal affect Musculoskeletal:  Normal muscle tone, normal strength NEURO: Alert and oriented x 3, no focal neurologic deficits   IMPRESSION and PLAN:    1) Colon Cancer 56 year old male with non-traversable sigmoid adenocarcinoma diagnosed on index colonoscopy in 06/2021, now status post resection and currently undergoing  chemotherapy with plan for radiation therapy in March.  -Colonoscopy to be scheduled first week of March (after completion of chemotherapy but before starting radiation) for completion     The indications, risks, and benefits of colonoscopy were explained to the patient in detail. Risks include but are not limited to bleeding, perforation, adverse reaction to medications, and cardiopulmonary compromise. Sequelae include but are not limited to the possibility of surgery, hospitalization,  and mortality. The patient verbalized understanding and wished to proceed. All questions answered, referred to the scheduler and bowel prep ordered. Further recommendations pending results of the exam.       Lavena Bullion ,DO, FACG 11/08/2021, 9:07 AM

## 2021-11-08 NOTE — Patient Instructions (Addendum)
If you are age 58 or older, your body mass index should be between 23-30. Your Body mass index is 25.1 kg/m. If this is out of the aforementioned range listed, please consider follow up with your Primary Care Provider.  If you are age 58 or younger, your body mass index should be between 19-25. Your Body mass index is 25.1 kg/m. If this is out of the aformentioned range listed, please consider follow up with your Primary Care Provider.   __________________________________________________________  The [facility] providers would like to encourage you to use [facility] to communicate with providers for non-urgent requests or questions.  Due to long hold times on the telephone, sending your provider a message by [facility] may be a faster and more efficient way to get a response.  Please allow 48 business hours for a response.  Please remember that this is for non-urgent requests.    Due to recent changes in healthcare laws, you may see the results of your imaging and laboratory studies on MyChart before your provider has had a chance to review them.  We understand that in some cases there may be results that are confusing or concerning to you. Not all laboratory results come back in the same time frame and the provider may be waiting for multiple results in order to interpret others.  Please give [place] 48 hours in order for your provider to thoroughly review all the results before contacting the office for clarification of your results.    Hold Iron tablets for 5 days prior to your procedure.  We will call you to schedule your colonoscopy as soon as we have schedule available for March.  Thank you for choosing me and [facility].  Vito Cirigliano, D.O.

## 2021-11-10 ENCOUNTER — Telehealth: Payer: Self-pay

## 2021-11-10 MED FILL — Dexamethasone Sodium Phosphate Inj 100 MG/10ML: INTRAMUSCULAR | Qty: 1 | Status: AC

## 2021-11-10 NOTE — Telephone Encounter (Signed)
Appointment for colonoscopy is made with the patient for 3/3//23 @ 2:00pm. Instruction was given at the office visit on 11/08/21. I went over the instruction again. Patient verbalized understanding.

## 2021-11-11 ENCOUNTER — Inpatient Hospital Stay: Payer: 59 | Attending: Nurse Practitioner

## 2021-11-11 ENCOUNTER — Other Ambulatory Visit: Payer: Self-pay

## 2021-11-11 ENCOUNTER — Inpatient Hospital Stay (HOSPITAL_BASED_OUTPATIENT_CLINIC_OR_DEPARTMENT_OTHER): Payer: 59 | Admitting: Hematology

## 2021-11-11 ENCOUNTER — Inpatient Hospital Stay: Payer: 59

## 2021-11-11 VITALS — BP 131/69 | HR 69 | Resp 18 | Ht 73.0 in | Wt 190.3 lb

## 2021-11-11 DIAGNOSIS — R5383 Other fatigue: Secondary | ICD-10-CM | POA: Insufficient documentation

## 2021-11-11 DIAGNOSIS — R112 Nausea with vomiting, unspecified: Secondary | ICD-10-CM | POA: Insufficient documentation

## 2021-11-11 DIAGNOSIS — C187 Malignant neoplasm of sigmoid colon: Secondary | ICD-10-CM | POA: Diagnosis present

## 2021-11-11 DIAGNOSIS — Z5111 Encounter for antineoplastic chemotherapy: Secondary | ICD-10-CM | POA: Insufficient documentation

## 2021-11-11 DIAGNOSIS — R634 Abnormal weight loss: Secondary | ICD-10-CM | POA: Insufficient documentation

## 2021-11-11 DIAGNOSIS — K5900 Constipation, unspecified: Secondary | ICD-10-CM | POA: Diagnosis not present

## 2021-11-11 DIAGNOSIS — D509 Iron deficiency anemia, unspecified: Secondary | ICD-10-CM | POA: Insufficient documentation

## 2021-11-11 LAB — CMP (CANCER CENTER ONLY)
ALT: 57 U/L — ABNORMAL HIGH (ref 0–44)
AST: 51 U/L — ABNORMAL HIGH (ref 15–41)
Albumin: 4 g/dL (ref 3.5–5.0)
Alkaline Phosphatase: 77 U/L (ref 38–126)
Anion gap: 9 (ref 5–15)
BUN: 8 mg/dL (ref 6–20)
CO2: 25 mmol/L (ref 22–32)
Calcium: 9.1 mg/dL (ref 8.9–10.3)
Chloride: 106 mmol/L (ref 98–111)
Creatinine: 0.85 mg/dL (ref 0.61–1.24)
GFR, Estimated: >60 mL/min (ref >60)
Glucose, Bld: 130 mg/dL — ABNORMAL HIGH (ref 70–99)
Potassium: 3.4 mmol/L — ABNORMAL LOW (ref 3.5–5.1)
Sodium: 140 mmol/L (ref 135–145)
Total Bilirubin: 0.4 mg/dL (ref 0.3–1.2)
Total Protein: 7.8 g/dL (ref 6.5–8.1)

## 2021-11-11 LAB — CBC WITH DIFFERENTIAL (CANCER CENTER ONLY)
Abs Immature Granulocytes: 0.01 10*3/uL (ref 0.00–0.07)
Basophils Absolute: 0 10*3/uL (ref 0.0–0.1)
Basophils Relative: 1 %
Eosinophils Absolute: 0.2 10*3/uL (ref 0.0–0.5)
Eosinophils Relative: 4 %
HCT: 36.7 % — ABNORMAL LOW (ref 39.0–52.0)
Hemoglobin: 11.5 g/dL — ABNORMAL LOW (ref 13.0–17.0)
Immature Granulocytes: 0 %
Lymphocytes Relative: 42 %
Lymphs Abs: 1.5 10*3/uL (ref 0.7–4.0)
MCH: 24.6 pg — ABNORMAL LOW (ref 26.0–34.0)
MCHC: 31.3 g/dL (ref 30.0–36.0)
MCV: 78.6 fL — ABNORMAL LOW (ref 80.0–100.0)
Monocytes Absolute: 0.6 10*3/uL (ref 0.1–1.0)
Monocytes Relative: 16 %
Neutro Abs: 1.3 10*3/uL — ABNORMAL LOW (ref 1.7–7.7)
Neutrophils Relative %: 37 %
Platelet Count: 188 10*3/uL (ref 150–400)
RBC: 4.67 MIL/uL (ref 4.22–5.81)
RDW: 19.2 % — ABNORMAL HIGH (ref 11.5–15.5)
WBC Count: 3.6 10*3/uL — ABNORMAL LOW (ref 4.0–10.5)
nRBC: 0 % (ref 0.0–0.2)

## 2021-11-11 LAB — FERRITIN: Ferritin: 16 ng/mL — ABNORMAL LOW (ref 24–336)

## 2021-11-11 MED ORDER — OXALIPLATIN CHEMO INJECTION 100 MG/20ML
110.0000 mg/m2 | Freq: Once | INTRAVENOUS | Status: AC
  Administered 2021-11-11: 230 mg via INTRAVENOUS
  Filled 2021-11-11: qty 40

## 2021-11-11 MED ORDER — SODIUM CHLORIDE 0.9 % IV SOLN
10.0000 mg | Freq: Once | INTRAVENOUS | Status: AC
  Administered 2021-11-11: 10 mg via INTRAVENOUS
  Filled 2021-11-11: qty 10

## 2021-11-11 MED ORDER — PALONOSETRON HCL INJECTION 0.25 MG/5ML
0.2500 mg | Freq: Once | INTRAVENOUS | Status: AC
  Administered 2021-11-11: 0.25 mg via INTRAVENOUS
  Filled 2021-11-11: qty 5

## 2021-11-11 MED ORDER — DEXTROSE 5 % IV SOLN: Freq: Once | INTRAVENOUS | Status: AC

## 2021-11-11 MED ORDER — DEXAMETHASONE 4 MG PO TABS: 4.0000 mg | ORAL_TABLET | Freq: Every day | ORAL | 0 refills | Status: DC

## 2021-11-11 NOTE — Progress Notes (Signed)
[place]   Telephone:(336) [phone] [phone]) [phone]   Clinic Follow up Note   Patient Care Team: Patient, No Pcp Per (Inactive) as PCP - General (General Practice) Lavena Bullion, DO as Consulting Physician (Gastroenterology) Ileana Roup, MD as Consulting Physician (General Surgery) Truitt Merle, MD as Consulting Physician (Hematology) Alla Feeling, NP as Nurse Practitioner (Nurse Practitioner)  Date of Service:  11/11/2021  CHIEF COMPLAINT: f/u of colon cancer  CURRENT THERAPY:  Adjuvant CAPOX, with Xeloda 1548m AM, 20055mPM 14 days on/7 days off, started 09/30/21  ASSESSMENT & PLAN:  Ethan Martin a [age].o. male with   1. 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 Baseline CEA normal, staging work-up was negative for distant metastasis -He underwent surgical resection 08/31/2021 by Dr. WhDema Severinpath showed: 4 cm invasive moderately differentiated adenocarcinoma invading into subserosa; metastatic carcinoma in 2/22 lymph nodes with lymphovascular invasion; positive radial margin.  There was no perineural invasion or perforation.   -I previously reviewed the role of adjuvant radiation therapy with him today, due to his positive surgical margin. I have discussed this with Drs. White and [place] recommendation is for CAPOX followed by radiation with Xeloda. -he began CAPOX today, with Xeloda 1500 mg in the morning, 2000 mg in the evening for 14 days on/7 days off, on 09/30/21. -her tolerated cycle 1 with some difficulty; he reports nausea, cold sensitivity, taste change, and fatigue. He again experienced nausea but also with one instance of vomiting after C2. -labs reviewed, overall stable. We will reduce chemo dose today  due to side effects. -will add iv Emend before chemo if I can his insurance approved today, if not, will call in oral Emend and he will take dexa for 3-5 days after chemo    2. Symptom Management: Nausea with Vomiting, Cold Sensitivity, Fatigue, Taste Change -secondary to oxaliplatin -he reports the cold sensitivity lasted about 6-7 days, which affected his taste and ability to tolerate swallowing. -he notes his nausea is better controlled on compazine than zofran, but his nausea does not resolve on either medicine.  -he reports he experienced an episode of vomiting after C2 -I will prescribe dexamethasone for him to use for the first 3-5 days after chemo. -he reports his symptoms essentially resolve after 7-8 days.   3. Weight loss, constipation -weight has been stable lately. -he has established care with dietician.   4. Iron and B12 deficiency anemia -Secondary to #1 -Work-up 07/19/21 showed B12 125, ferritin 6, hemoglobin 10 -He began oral B12 and iron supplements, tolerating well -I previously recommend IV iron and B12 injections, he declined. -hgb stable 11.5 today (11/11/21)   5. Family history -He has 1 healthy daughter age 2424we discussed beginning screening in her early 4015sue to patient's young age at diagnosis -Given no other strong family history he likely does not need genetic testing   6. Health maintenance -I encouraged a healthy active lifestyle with normal diet, hydration, avoiding smoking, limiting alcohol -He does not get flu or COVID vaccines, I previously reviewed infection precautions especially on chemo -he is scheduled for repeat/complete colonoscopy on 12/23/21 with Dr. CiBryan Lemma   PLAN: -proceed with C3 CAPOX today -he will start Xeloda today at same dose for 14 days  -I called in dexamethasone for him  to use for 3-5 days after chemo  -will call in oral Emend for him also  -lab and f/u in 10-11 days -lab, f/u, and CAPOX in 3 weeks  -add IVF 2hr on  2/11 or 2/13   No problem-specific Assessment & Plan notes found for this encounter.   SUMMARY OF ONCOLOGIC HISTORY: Oncology History  Malignant neoplasm of sigmoid colon ([place])  07/20/2021 Procedure   Diagnostic EGD by Dr. [name] - Normal esophagus. - Z-line regular, 40 cm from the incisors. - Normal stomach. Biopsied. - Normal examined duodenum. Biopsied. Colonoscopy - Hemorrhoids found on perianal exam. - Likely malignant completely obstructing tumor in the sigmoid colon. Biopsied. Tattooed. - Non-bleeding internal hemorrhoids.   07/20/2021 Initial Biopsy   Diagnosis 1. Duodenum, Biopsy - BENIGN DUODENAL MUCOSA - NO ACUTE INFLAMMATION, VILLOUS BLUNTING OR INCREASED INTRAEPITHELIAL LYMPHOCYTES 2. Stomach, biopsy - MILD CHRONIC GASTRITIS WITH REACTIVE CHANGES - NO H. PYLORI OR INTESTINAL METAPLASIA IDENTIFIED - SEE COMMENT 3. Sigmoid Colon Biopsy - ADENOCARCINOMA - SEE COMMENT Microscopic Comment 2. H. pylori immunohistochemistry is NEGATIVE for microorganisms.   07/20/2021 Tumor Marker   CEA normal 3.2   07/28/2021 Imaging   Staging CT CAP IMPRESSION: 1. Circumferential wall thickening of the distal sigmoid colon, in keeping with primary colon malignancy identified by colonoscopy. 2. There is some nodularity in the adjacent mesocolon about the inferior mesenteric artery, as well as prominent subcentimeter lymph nodes, worrisome for early nodal or soft tissue metastatic disease. 3. No evidence of distant metastatic disease in the chest, abdomen, or pelvis.   08/31/2021 Cancer Staging   Staging form: Colon and Rectum, AJCC 8th Edition - Pathologic stage from 08/31/2021: Stage IIIB (pT3, pN1b, cM0) - Signed by Alla Feeling, NP on 09/19/2021 Stage prefix: Initial diagnosis Histologic grading system: 4 grade system Histologic grade (G): G2 Laterality: Left Lymph-vascular invasion (LVI): LVI present/identified, NOS Carcinoembryonic antigen (CEA) (ng/mL):  3.2 Perineural invasion (PNI): Absent    08/31/2021 Definitive Surgery   PREOP DIAGNOSIS: sigmoid colon cancer POSTOP DIAGNOSIS: Rectosigmoid colon cancer PROCEDURE:  Robotic assisted low anterior resection with double stapled colorectal anastomosis Intraoperative assessment of perfusion (ICG) Flexible sigmoidoscopy Bilateral transversus abdominus plane blocks SURGEON: Sharon Mt. Dema Severin, MD ASSISTANT: Leighton Ruff, MD   62/05/6380 Pathology Results   FINAL MICROSCOPIC DIAGNOSIS: A. COLON, RECTOSIGMOID, RESECTION: - Invasive moderately differentiated adenocarcinoma, 4 cm, involving distal sigmoid colon - Carcinoma invades into subserosa - Inked radial margin is positive for carcinoma - Metastatic carcinoma to two of twenty-two lymph nodes (2/22) - See oncology table B. FINAL DISTAL MARGIN: - Colonic donut, negative for carcinoma pT3pN1b, stage IIIb MMR IHC normal MSI-stable   09/19/2021 Initial Diagnosis   Malignant neoplasm of sigmoid colon ([place])   09/30/2021 -  Chemotherapy   Patient is on Treatment Plan : COLORECTAL Xelox (Capeox) q21d        INTERVAL HISTORY:  Ethan Martin is here for a follow up of colon cancer. He was last seen by me on 10/21/21. He presents to the clinic alone. He reports he experienced nausea with vomiting for 5 days during and following infusion. He notes that despite his good appetite, he was unable to eat more than a few bites due to the nausea.   All other systems were reviewed with the patient and are negative.  MEDICAL HISTORY:  Past Medical History:  Diagnosis Date   Arthritis    Cancer ([place])    Cataract    GERD (gastroesophageal reflux disease)     SURGICAL HISTORY:  Past Surgical History:  Procedure Laterality Date   FLEXIBLE SIGMOIDOSCOPY N/A 08/31/2021   Procedure: FLEXIBLE SIGMOIDOSCOPY;  Surgeon: Ileana Roup, MD;  Location: [facility];  Service: General;  Laterality: N/A;   NOSE SURGERY     WISDOM TOOTH EXTRACTION      1988    I have reviewed the social history and family history with the patient and they are unchanged from previous note.  ALLERGIES:  has No Known Allergies.  MEDICATIONS:  Current Outpatient Medications  Medication Sig Dispense Refill   capecitabine (XELODA) 500 MG tablet TAKE 3 TABLETS BY MOUTH IN THE  MORNING AND 4 TABLETS BY MOUTH  IN THE EVENING WITHIN 30 MINUTES AFTER A MEAL FOR 14 DAYS ON THEN 7 DAYS OFF EVERY 21 DAYS 98 tablet 0   ferrous sulfate 325 (65 FE) MG EC tablet TAKE 1 TABLET (325 MG TOTAL) BY MOUTH 2 (TWO) TIMES DAILY. TAKE WITH VITAMIN C OR ORANGE JUICE PLEASE. TAKE IRON SUPPLEMENT 2 HOURS BEFORE OR 4 HOURS AFTER PPI OR OTHER ANTACIDS 30 tablet 6   ondansetron (ZOFRAN) 8 MG tablet Take 1 tablet (8 mg total) by mouth 2 (two) times daily as needed for refractory nausea / vomiting. Start on day 3 after chemotherapy. 30 tablet 1   prochlorperazine (COMPAZINE) 10 MG tablet Take 1 tablet (10 mg total) by mouth every 6 (six) hours as needed (Nausea or vomiting). 30 tablet 2   Sod Picosulfate-Mag Ox-Cit Acd (CLENPIQ) 10-3.5-12 MG-GM -GM/160ML SOLN Take 1 kit by mouth as directed. 320 mL 0   vitamin B-12 (CYANOCOBALAMIN) 1000 MCG tablet Take 1 tablet (1,000 mcg total) by mouth daily. 30 tablet 6   No current facility-administered medications for this visit.    PHYSICAL EXAMINATION: ECOG PERFORMANCE STATUS: 1 - Symptomatic but completely ambulatory  There were no vitals filed for this visit. Wt Readings from Last 3 Encounters:  11/08/21 190 lb 4 oz (86.3 kg)  10/21/21 188 lb 6.4 oz (85.5 kg)  09/30/21 190 lb 4 oz (86.3 kg)     GENERAL:alert, no distress and comfortable SKIN: skin color normal, no rashes or significant lesions EYES: normal, Conjunctiva are pink and non-injected, sclera clear  NEURO: alert & oriented x 3 with fluent speech  LABORATORY DATA:  I have reviewed the data as listed CBC Latest Ref Rng & Units 11/11/2021 10/21/2021 09/30/2021  WBC 4.0 - 10.5 K/uL  3.6(L) 3.9(L) 3.8(L)  Hemoglobin 13.0 - 17.0 g/dL 11.5(L) 11.5(L) 10.8(L)  Hematocrit 39.0 - 52.0 % 36.7(L) 36.6(L) 35.5(L)  Platelets 150 - 400 K/uL 188 306 277     CMP Latest Ref Rng & Units 10/21/2021 09/30/2021 09/02/2021  Glucose 70 - 99 mg/dL 111(H) 102(H) 87  BUN 6 - 20 mg/dL 7 8 11   Creatinine 0.61 - 1.24 mg/dL 0.81 1.01 0.74  Sodium 135 - 145 mmol/L 142 142 137  Potassium 3.5 - 5.1 mmol/L 3.6 3.6 4.0  Chloride 98 - 111 mmol/L 106 107 101  CO2 22 - 32 mmol/L 29 25 28   Calcium 8.9 - 10.3 mg/dL 9.1 8.6(L) 8.5(L)  Total Protein 6.5 - 8.1 g/dL 7.6 7.7 -  Total Bilirubin 0.3 - 1.2 mg/dL 0.4 0.4 -  Alkaline Phos 38 - 126 U/L 71 87 -  AST 15 - 41 U/L 14(L) 12(L) -  ALT 0 - 44 U/L 7 7 -      RADIOGRAPHIC STUDIES: I have personally reviewed the radiological images as listed and agreed with the findings in the report. No  results found.    No orders of the defined types were placed in this encounter.  All questions were answered. The patient knows to call the clinic with any problems, questions or concerns. No barriers to learning was detected. The total time spent in the appointment was 30 minutes.     Truitt Merle, MD 11/11/2021   I, Wilburn Mylar, am acting as scribe for Truitt Merle, MD.   I have reviewed the above documentation for accuracy and completeness, and I agree with the above.

## 2021-11-11 NOTE — Patient Instructions (Signed)
[facility] MEDICAL ONCOLOGY   ?Discharge Instructions: ?Thank you for choosing [facility] to provide your oncology and hematology care.  ? ?If you have a lab appointment with the Cancer Center, please go directly to the Cancer Center and check in at the registration area. ?  ?Wear comfortable clothing and clothing appropriate for easy access to any Portacath or PICC line.  ? ?We strive to give you quality time with your provider. You may need to reschedule your appointment if you arrive late (15 or more minutes).  Arriving late affects you and other patients whose appointments are after yours.  Also, if you miss three or more appointments without notifying the office, you may be dismissed from the clinic at the provider?s discretion.    ?  ?For prescription refill requests, have your pharmacy contact our office and allow 72 hours for refills to be completed.   ? ?Today you received the following chemotherapy and/or immunotherapy agents: oxaliplatin    ?  ?To help prevent nausea and vomiting after your treatment, we encourage you to take your nausea medication as directed. ? ?BELOW ARE SYMPTOMS THAT SHOULD BE REPORTED IMMEDIATELY: ?*FEVER GREATER THAN 100.4 F (38 ?C) OR HIGHER ?*CHILLS OR SWEATING ?*NAUSEA AND VOMITING THAT IS NOT CONTROLLED WITH YOUR NAUSEA MEDICATION ?*UNUSUAL SHORTNESS OF BREATH ?*UNUSUAL BRUISING OR BLEEDING ?*URINARY PROBLEMS (pain or burning when urinating, or frequent urination) ?*BOWEL PROBLEMS (unusual diarrhea, constipation, pain near the anus) ?TENDERNESS IN MOUTH AND THROAT WITH OR WITHOUT PRESENCE OF ULCERS (sore throat, sores in mouth, or a toothache) ?UNUSUAL RASH, SWELLING OR PAIN  ?UNUSUAL VAGINAL DISCHARGE OR ITCHING  ? ?Items with * indicate a potential emergency and should be followed up as soon as possible or go to the Emergency Department if any problems should occur. ? ?Please show the CHEMOTHERAPY ALERT CARD or IMMUNOTHERAPY ALERT CARD at check-in  to the Emergency Department and triage nurse. ? ?Should you have questions after your visit or need to cancel or reschedule your appointment, please contact [facility]ICAL [facility]COLOG[facility]  Dept: [phone]  and follow the prompts.  Office hours are 8:00 a.m. to 4:30 p.m. Monday - Friday. Please note that voicemails left after 4:00 p.m. may not be returned until the following business day.  We are closed weekends and major holidays. You have access to a nurse at all times for urgent questions. Please call the main number to the clinic Dept: [phone] and follow the prompts. ? ? ?For any non-urgent questions, you may also contact your provider using MyChart. We now offer e-Visits for anyone 18 and older to request care online for non-urgent symptoms. For details visit [email or web address]. ?  ?Also download the MyChart app! Go to the app store, search "MyChart", open the app, select [facility], and log in with your MyChart username and password. ? ?Due to Covid, a mask is required upon entering the hospital/clinic. If you do not have a mask, one will be given to you upon arrival. For doctor visits, patients may have 1 support person aged 18 or older with them. For treatment visits, patients cannot have anyone with them due to current Covid guidelines and our immunocompromised population.  ? ?

## 2021-11-11 NOTE — Progress Notes (Signed)
Ok to treat w/ Oxaliplatin today with ANC 1.3 per Dr. Burr Medico

## 2021-11-12 ENCOUNTER — Encounter: Payer: Self-pay | Admitting: Hematology

## 2021-11-12 ENCOUNTER — Other Ambulatory Visit (HOSPITAL_COMMUNITY): Payer: Self-pay

## 2021-11-12 MED ORDER — APREPITANT 80 MG PO CAPS
80.0000 mg | ORAL_CAPSULE | Freq: Every day | ORAL | 0 refills | Status: DC
  Filled 2021-11-12: qty 2, 2d supply, fill #0

## 2021-11-12 MED ORDER — APREPITANT 80 MG PO CAPS: 80.0000 mg | ORAL_CAPSULE | Freq: Every day | ORAL | 0 refills | Status: DC

## 2021-11-14 ENCOUNTER — Other Ambulatory Visit: Payer: Self-pay

## 2021-11-14 ENCOUNTER — Inpatient Hospital Stay: Payer: 59

## 2021-11-14 ENCOUNTER — Other Ambulatory Visit (HOSPITAL_COMMUNITY): Payer: Self-pay

## 2021-11-14 ENCOUNTER — Other Ambulatory Visit: Payer: Self-pay | Admitting: Hematology

## 2021-11-14 VITALS — BP 138/76 | HR 60 | Temp 98.3°F | Resp 18 | Wt 180.4 lb

## 2021-11-14 DIAGNOSIS — Z5111 Encounter for antineoplastic chemotherapy: Secondary | ICD-10-CM | POA: Diagnosis not present

## 2021-11-14 DIAGNOSIS — C187 Malignant neoplasm of sigmoid colon: Secondary | ICD-10-CM

## 2021-11-14 MED ORDER — SCOPOLAMINE 1 MG/3DAYS TD PT72: 1.0000 | MEDICATED_PATCH | TRANSDERMAL | 0 refills | Status: DC

## 2021-11-14 MED ORDER — SODIUM CHLORIDE 0.9 % IV SOLN: Freq: Once | INTRAVENOUS | Status: AC

## 2021-11-14 NOTE — Patient Instructions (Signed)

## 2021-11-14 NOTE — Progress Notes (Signed)
Per Dr. Burr Medico, infuse 1L NS over 2 hours today.

## 2021-11-15 ENCOUNTER — Other Ambulatory Visit (HOSPITAL_COMMUNITY): Payer: Self-pay

## 2021-11-15 ENCOUNTER — Telehealth: Payer: Self-pay | Admitting: Hematology

## 2021-11-15 NOTE — Telephone Encounter (Signed)
Scheduled follow-up appointments per 1/20 los. Patient is aware.

## 2021-11-21 ENCOUNTER — Other Ambulatory Visit: Payer: Self-pay

## 2021-11-21 ENCOUNTER — Inpatient Hospital Stay: Payer: 59

## 2021-11-21 ENCOUNTER — Inpatient Hospital Stay: Payer: 59 | Admitting: Hematology

## 2021-11-21 ENCOUNTER — Ambulatory Visit: Payer: 59 | Admitting: Nurse Practitioner

## 2021-11-21 ENCOUNTER — Other Ambulatory Visit: Payer: 59

## 2021-11-21 VITALS — BP 135/74 | HR 62 | Temp 98.1°F | Resp 18 | Ht 73.0 in | Wt 186.8 lb

## 2021-11-21 DIAGNOSIS — C187 Malignant neoplasm of sigmoid colon: Secondary | ICD-10-CM

## 2021-11-21 DIAGNOSIS — Z5111 Encounter for antineoplastic chemotherapy: Secondary | ICD-10-CM | POA: Diagnosis not present

## 2021-11-21 LAB — CBC WITH DIFFERENTIAL (CANCER CENTER ONLY)
Abs Immature Granulocytes: 0.01 10*3/uL (ref 0.00–0.07)
Basophils Absolute: 0 10*3/uL (ref 0.0–0.1)
Basophils Relative: 1 %
Eosinophils Absolute: 0.1 10*3/uL (ref 0.0–0.5)
Eosinophils Relative: 1 %
HCT: 35.6 % — ABNORMAL LOW (ref 39.0–52.0)
Hemoglobin: 11.3 g/dL — ABNORMAL LOW (ref 13.0–17.0)
Immature Granulocytes: 0 %
Lymphocytes Relative: 44 %
Lymphs Abs: 2.1 10*3/uL (ref 0.7–4.0)
MCH: 25.2 pg — ABNORMAL LOW (ref 26.0–34.0)
MCHC: 31.7 g/dL (ref 30.0–36.0)
MCV: 79.3 fL — ABNORMAL LOW (ref 80.0–100.0)
Monocytes Absolute: 0.7 10*3/uL (ref 0.1–1.0)
Monocytes Relative: 15 %
Neutro Abs: 1.8 10*3/uL (ref 1.7–7.7)
Neutrophils Relative %: 39 %
Platelet Count: 210 10*3/uL (ref 150–400)
RBC: 4.49 MIL/uL (ref 4.22–5.81)
RDW: 19.7 % — ABNORMAL HIGH (ref 11.5–15.5)
WBC Count: 4.7 10*3/uL (ref 4.0–10.5)
nRBC: 0 % (ref 0.0–0.2)

## 2021-11-21 LAB — CMP (CANCER CENTER ONLY)
ALT: 16 U/L (ref 0–44)
AST: 18 U/L (ref 15–41)
Albumin: 4.1 g/dL (ref 3.5–5.0)
Alkaline Phosphatase: 76 U/L (ref 38–126)
Anion gap: 6 (ref 5–15)
BUN: 9 mg/dL (ref 6–20)
CO2: 29 mmol/L (ref 22–32)
Calcium: 9.3 mg/dL (ref 8.9–10.3)
Chloride: 107 mmol/L (ref 98–111)
Creatinine: 0.8 mg/dL (ref 0.61–1.24)
GFR, Estimated: >60 mL/min (ref >60)
Glucose, Bld: 67 mg/dL — ABNORMAL LOW (ref 70–99)
Potassium: 3.8 mmol/L (ref 3.5–5.1)
Sodium: 142 mmol/L (ref 135–145)
Total Bilirubin: 0.4 mg/dL (ref 0.3–1.2)
Total Protein: 7.9 g/dL (ref 6.5–8.1)

## 2021-11-21 LAB — FERRITIN: Ferritin: 35 ng/mL (ref 24–336)

## 2021-11-21 LAB — VITAMIN B12: Vitamin B-12: 366 pg/mL (ref 180–914)

## 2021-11-21 NOTE — Progress Notes (Signed)
Ethan Martin   [phone] Fax:([phone]   Clinic Follow up Note   Patient Care Team: Patient, No Pcp Per (Inactive) as PCP - General (General Practice) Lavena Bullion, DO as Consulting Physician (Gastroenterology) Ileana Roup, MD as Consulting Physician (General Surgery) Truitt Merle, MD as Consulting Physician (Hematology) Alla Feeling, NP as Nurse Practitioner (Nurse Practitioner)  Date of Service:  11/21/2021  CHIEF COMPLAINT: f/u of colon cancer  CURRENT THERAPY:  Adjuvant CAPOX, with Xeloda 1512m AM, 20061mPM 14 days on/7 days off, started 09/30/21  ASSESSMENT & PLAN:  Ethan Martin a [age].o. male with   1. 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 Baseline CEA normal, staging work-up was negative for distant metastasis -He underwent surgical resection 08/31/2021 by Dr. WhDema Severinpath showed: 4 cm invasive moderately differentiated adenocarcinoma invading into subserosa; metastatic carcinoma in 2/22 lymph nodes with lymphovascular invasion; positive radial margin.  There was no perineural invasion or perforation.   -I previously reviewed the role of adjuvant radiation therapy with him today, due to his positive surgical margin. I have discussed this with Drs. White and [place] recommendation is for CAPOX followed by radiation with Xeloda. -he began CAPOX, with Xeloda 1500 mg in the morning, 2000 mg in the evening for 14 days on/7 days off, on 09/30/21. -her tolerated cycle 1 with some difficulty; he reports nausea, cold sensitivity, taste change, and fatigue. He again experienced nausea but also with one instance of vomiting after C2. Oral Emend and dexa were called in for him for cycle 3 but he did not  use. He tolerated cycle 3 chemotherapy well with IV fluids after chemo -he is scheduled for last cycle chemo on 2/10  -He will proceed adjuvant radiation in March.  Discussed with Dr. MoLisbeth Renshawf he needs concurrent Xeloda.   2. Symptom Management: Nausea with Vomiting, Cold Sensitivity, Fatigue, Taste Change -overall manageable, felt better with IV fluids after chemo last cycle    3. Weight loss, constipation -weight has been stable lately. -he has established care with dietician.   4. Iron and B12 deficiency anemia -Secondary to #1 -Work-up 07/19/21 showed B12 125, ferritin 6, hemoglobin 10 -He began oral B12 and iron supplements, tolerating well -I previously recommend IV iron and B12 injections, he declined. -hgb stable 11.5 today (11/11/21)   5. Family history -He has 1 healthy daughter age 1954we discussed beginning screening in her early 89sue to patient's young age at diagnosis -Given no other strong family history he likely does not need genetic testing   6. Health maintenance -I encouraged a healthy active lifestyle with normal diet, hydration, avoiding smoking, limiting alcohol -He does not get flu or COVID vaccines, I previously reviewed infection precautions especially on chemo -he is scheduled for repeat/complete colonoscopy on 12/23/21 with Dr. CiBryan Lemma   PLAN: -lab, f/u, and last cycle CAPOX on 12/02/21 as scheduled             -IVF 2hr on 2/11   No problem-specific Assessment & Plan notes found for this encounter.   SUMMARY OF ONCOLOGIC HISTORY: Oncology History  Malignant neoplasm of sigmoid colon ([place] 07/20/2021 Procedure   Diagnostic EGD by Dr. CiBryan Lemma Normal esophagus. - Z-line regular, 40 cm from the incisors. - Normal stomach. Biopsied. -  Normal examined duodenum. Biopsied. Colonoscopy - Hemorrhoids found on perianal exam. - Likely malignant completely obstructing tumor in the sigmoid colon. Biopsied. Tattooed. - Non-bleeding internal  hemorrhoids.   07/20/2021 Initial Biopsy   Diagnosis 1. Duodenum, Biopsy - BENIGN DUODENAL MUCOSA - NO ACUTE INFLAMMATION, VILLOUS BLUNTING OR INCREASED INTRAEPITHELIAL LYMPHOCYTES 2. Stomach, biopsy - MILD CHRONIC GASTRITIS WITH REACTIVE CHANGES - NO H. PYLORI OR INTESTINAL METAPLASIA IDENTIFIED - SEE COMMENT 3. Sigmoid Colon Biopsy - ADENOCARCINOMA - SEE COMMENT Microscopic Comment 2. H. pylori immunohistochemistry is NEGATIVE for microorganisms.   07/20/2021 Tumor Marker   CEA normal 3.2   07/28/2021 Imaging   Staging CT CAP IMPRESSION: 1. Circumferential wall thickening of the distal sigmoid colon, in keeping with primary colon malignancy identified by colonoscopy. 2. There is some nodularity in the adjacent mesocolon about the inferior mesenteric artery, as well as prominent subcentimeter lymph nodes, worrisome for early nodal or soft tissue metastatic disease. 3. No evidence of distant metastatic disease in the chest, abdomen, or pelvis.   08/31/2021 Cancer Staging   Staging form: Colon and Rectum, AJCC 8th Edition - Pathologic stage from 08/31/2021: Stage IIIB (pT3, pN1b, cM0) - Signed by Alla Feeling, NP on 09/19/2021 Stage prefix: Initial diagnosis Histologic grading system: 4 grade system Histologic grade (G): G2 Laterality: Left Lymph-vascular invasion (LVI): LVI present/identified, NOS Carcinoembryonic antigen (CEA) (ng/mL): 3.2 Perineural invasion (PNI): Absent    08/31/2021 Definitive Surgery   PREOP DIAGNOSIS: sigmoid colon cancer POSTOP DIAGNOSIS: Rectosigmoid colon cancer PROCEDURE:  Robotic assisted low anterior resection with double stapled colorectal anastomosis Intraoperative assessment of perfusion (ICG) Flexible sigmoidoscopy Bilateral transversus abdominus plane blocks SURGEON: Sharon Mt. Dema Severin, MD ASSISTANT: Leighton Ruff, MD   60/10/930 Pathology Results   FINAL MICROSCOPIC DIAGNOSIS: A. COLON, RECTOSIGMOID, RESECTION: - Invasive  moderately differentiated adenocarcinoma, 4 cm, involving distal sigmoid colon - Carcinoma invades into subserosa - Inked radial margin is positive for carcinoma - Metastatic carcinoma to two of twenty-two lymph nodes (2/22) - See oncology table B. FINAL DISTAL MARGIN: - Colonic donut, negative for carcinoma pT3pN1b, stage IIIb MMR IHC normal MSI-stable   09/19/2021 Initial Diagnosis   Malignant neoplasm of sigmoid colon ([place])   09/30/2021 -  Chemotherapy   Patient is on Treatment Plan : COLORECTAL Xelox ([place]apeox) q21d        INTERVAL HISTORY:  Ethan Martin is here for a follow up of colon cancer. He was last seen by me on 11/11/21. He presents to the clinic alone.  He tolerated last cycle of chemotherapy better than the previous cycle, which he contributes to the IV fluids after chemo.  Mild nausea, no vomiting, fatigue and appetite issue has much improved.  No fever or other complaints.   All other systems were reviewed with the patient and are negative.  MEDICAL HISTORY:  Past Medical History:  Diagnosis Date   Arthritis    Cancer ([place])    Cataract    GERD (gastroesophageal reflux disease)     SURGICAL HISTORY: Past Surgical History:  Procedure Laterality Date   FLEXIBLE SIGMOIDOSCOPY N/A 08/31/2021   Procedure: FLEXIBLE SIGMOIDOSCOPY;  Surgeon: Ileana Roup, MD;  Location: [facility];  Service: General;  Laterality: N/A;   NOSE SURGERY     WISDOM TOOTH EXTRACTION     1988    I have reviewed the social history and family history with the patient and they are unchanged from previous note.  ALLERGIES:  has No Known Allergies.  MEDICATIONS:  Current Outpatient Medications  Medication  Sig Dispense Refill   aprepitant (EMEND) 80 MG capsule Take 1 capsule (80 mg total) by mouth daily. Start the day after chemotherapy. 2 capsule 0   capecitabine (XELODA) 500 MG tablet TAKE 3 TABLETS BY MOUTH IN THE  MORNING AND 4 TABLETS BY MOUTH  IN THE EVENING WITHIN 30 MINUTES AFTER A  MEAL FOR 14 DAYS ON THEN 7 DAYS OFF EVERY 21 DAYS 98 tablet 0   dexamethasone (DECADRON) 4 MG tablet Take 1 tablet (4 mg total) by mouth daily. Take for 3-5 days after chemo for nausea 20 tablet 0   ferrous sulfate 325 (65 FE) MG EC tablet TAKE 1 TABLET (325 MG TOTAL) BY MOUTH 2 (TWO) TIMES DAILY. TAKE WITH VITAMIN C OR ORANGE JUICE PLEASE. TAKE IRON SUPPLEMENT 2 HOURS BEFORE OR 4 HOURS AFTER PPI OR OTHER ANTACIDS 30 tablet 6   ondansetron (ZOFRAN) 8 MG tablet Take 1 tablet (8 mg total) by mouth 2 (two) times daily as needed for refractory nausea / vomiting. Start on day 3 after chemotherapy. 30 tablet 1   prochlorperazine (COMPAZINE) 10 MG tablet Take 1 tablet (10 mg total) by mouth every 6 (six) hours as needed (Nausea or vomiting). 30 tablet 2   scopolamine (TRANSDERM-SCOP) 1 MG/3DAYS Place 1 patch (1.5 mg total) onto the skin every 3 (three) days. 4 patch 0   Sod Picosulfate-Mag Ox-Cit Acd (CLENPIQ) 10-3.5-12 MG-GM -GM/160ML SOLN Take 1 kit by mouth as directed. 320 mL 0   vitamin B-12 (CYANOCOBALAMIN) 1000 MCG tablet Take 1 tablet (1,000 mcg total) by mouth daily. 30 tablet 6   No current facility-administered medications for this visit.    PHYSICAL EXAMINATION: ECOG PERFORMANCE STATUS: 1 - Symptomatic but completely ambulatory  There were no vitals filed for this visit. Wt Readings from Last 3 Encounters:  11/14/21 180 lb 6.4 oz (81.8 kg)  11/11/21 190 lb 4.8 oz (86.3 kg)  11/08/21 190 lb 4 oz (86.3 kg)     GENERAL:alert, no distress and comfortable SKIN: skin color, texture, turgor are normal, no rashes or significant lesions EYES: normal, Conjunctiva are pink and non-injected, sclera clear Musculoskeletal:no cyanosis of digits and no clubbing  NEURO: alert & oriented x 3 with fluent speech, no focal motor/sensory deficits  LABORATORY DATA:  I have reviewed the data as listed CBC Latest Ref Rng & Units 11/11/2021 10/21/2021 09/30/2021  WBC 4.0 - 10.5 K/uL 3.6(L) 3.9(L) 3.8(L)   Hemoglobin 13.0 - 17.0 g/dL 11.5(L) 11.5(L) 10.8(L)  Hematocrit 39.0 - 52.0 % 36.7(L) 36.6(L) 35.5(L)  Platelets 150 - 400 K/uL 188 306 277     CMP Latest Ref Rng & Units 11/11/2021 10/21/2021 09/30/2021  Glucose 70 - 99 mg/dL 130(H) 111(H) 102(H)  BUN 6 - 20 mg/dL 8 7 8   Creatinine 0.61 - 1.24 mg/dL 0.85 0.81 1.01  Sodium 135 - 145 mmol/L 140 142 142  Potassium 3.5 - 5.1 mmol/L 3.4(L) 3.6 3.6  Chloride 98 - 111 mmol/L 106 106 107  CO2 22 - 32 mmol/L 25 29 25   Calcium 8.9 - 10.3 mg/dL 9.1 9.1 8.6(L)  Total Protein 6.5 - 8.1 g/dL 7.8 7.6 7.7  Total Bilirubin 0.3 - 1.2 mg/dL 0.4 0.4 0.4  Alkaline Phos 38 - 126 U/L 77 71 87  AST 15 - 41 U/L 51(H) 14(L) 12(L)  ALT 0 - 44 U/L 57(H) 7 7      RADIOGRAPHIC STUDIES: I have personally reviewed the radiological images as listed and agreed with the findings in the report. No  results found.    No orders of the defined types were placed in this encounter.  All questions were answered. The patient knows to call the clinic with any problems, questions or concerns. No barriers to learning was detected. The total time spent in the appointment was 20 minutes.     Truitt Merle, MD 11/21/2021   I, Wilburn Mylar, am acting as scribe for Truitt Merle, MD.   I have reviewed the above documentation for accuracy and completeness, and I agree with the above.

## 2021-11-22 ENCOUNTER — Encounter: Payer: Self-pay | Admitting: Hematology

## 2021-11-23 ENCOUNTER — Other Ambulatory Visit: Payer: Self-pay | Admitting: Hematology

## 2021-11-23 DIAGNOSIS — C187 Malignant neoplasm of sigmoid colon: Secondary | ICD-10-CM

## 2021-11-24 ENCOUNTER — Other Ambulatory Visit (HOSPITAL_COMMUNITY): Payer: Self-pay

## 2021-11-27 ENCOUNTER — Other Ambulatory Visit: Payer: Self-pay | Admitting: Gastroenterology

## 2021-12-01 MED FILL — Dexamethasone Sodium Phosphate Inj 100 MG/10ML: INTRAMUSCULAR | Qty: 1 | Status: AC

## 2021-12-02 ENCOUNTER — Other Ambulatory Visit: Payer: Self-pay

## 2021-12-02 ENCOUNTER — Inpatient Hospital Stay: Payer: 59 | Admitting: Hematology

## 2021-12-02 ENCOUNTER — Encounter: Payer: Self-pay | Admitting: Hematology

## 2021-12-02 ENCOUNTER — Inpatient Hospital Stay: Payer: 59 | Attending: Nurse Practitioner

## 2021-12-02 ENCOUNTER — Inpatient Hospital Stay: Payer: 59

## 2021-12-02 VITALS — BP 140/71 | HR 72 | Wt 190.1 lb

## 2021-12-02 VITALS — Temp 98.9°F | Resp 18

## 2021-12-02 DIAGNOSIS — C187 Malignant neoplasm of sigmoid colon: Secondary | ICD-10-CM

## 2021-12-02 DIAGNOSIS — Z5111 Encounter for antineoplastic chemotherapy: Secondary | ICD-10-CM | POA: Insufficient documentation

## 2021-12-02 DIAGNOSIS — C779 Secondary and unspecified malignant neoplasm of lymph node, unspecified: Secondary | ICD-10-CM | POA: Insufficient documentation

## 2021-12-02 DIAGNOSIS — E860 Dehydration: Secondary | ICD-10-CM | POA: Insufficient documentation

## 2021-12-02 DIAGNOSIS — D519 Vitamin B12 deficiency anemia, unspecified: Secondary | ICD-10-CM | POA: Diagnosis not present

## 2021-12-02 LAB — CBC WITH DIFFERENTIAL (CANCER CENTER ONLY)
Abs Immature Granulocytes: 0.01 10*3/uL (ref 0.00–0.07)
Basophils Absolute: 0 10*3/uL (ref 0.0–0.1)
Basophils Relative: 1 %
Eosinophils Absolute: 0.2 10*3/uL (ref 0.0–0.5)
Eosinophils Relative: 7 %
HCT: 37 % — ABNORMAL LOW (ref 39.0–52.0)
Hemoglobin: 11.6 g/dL — ABNORMAL LOW (ref 13.0–17.0)
Immature Granulocytes: 0 %
Lymphocytes Relative: 38 %
Lymphs Abs: 1.1 10*3/uL (ref 0.7–4.0)
MCH: 25.2 pg — ABNORMAL LOW (ref 26.0–34.0)
MCHC: 31.4 g/dL (ref 30.0–36.0)
MCV: 80.4 fL (ref 80.0–100.0)
Monocytes Absolute: 0.5 10*3/uL (ref 0.1–1.0)
Monocytes Relative: 16 %
Neutro Abs: 1.1 10*3/uL — ABNORMAL LOW (ref 1.7–7.7)
Neutrophils Relative %: 38 %
Platelet Count: 208 10*3/uL (ref 150–400)
RBC: 4.6 MIL/uL (ref 4.22–5.81)
RDW: 21.5 % — ABNORMAL HIGH (ref 11.5–15.5)
WBC Count: 2.9 10*3/uL — ABNORMAL LOW (ref 4.0–10.5)
nRBC: 0 % (ref 0.0–0.2)

## 2021-12-02 LAB — CMP (CANCER CENTER ONLY)
ALT: 19 U/L (ref 0–44)
AST: 26 U/L (ref 15–41)
Albumin: 3.8 g/dL (ref 3.5–5.0)
Alkaline Phosphatase: 81 U/L (ref 38–126)
Anion gap: 7 (ref 5–15)
BUN: 6 mg/dL (ref 6–20)
CO2: 27 mmol/L (ref 22–32)
Calcium: 8.9 mg/dL (ref 8.9–10.3)
Chloride: 107 mmol/L (ref 98–111)
Creatinine: 0.85 mg/dL (ref 0.61–1.24)
GFR, Estimated: >60 mL/min (ref >60)
Glucose, Bld: 158 mg/dL — ABNORMAL HIGH (ref 70–99)
Potassium: 3.4 mmol/L — ABNORMAL LOW (ref 3.5–5.1)
Sodium: 141 mmol/L (ref 135–145)
Total Bilirubin: 0.4 mg/dL (ref 0.3–1.2)
Total Protein: 7.4 g/dL (ref 6.5–8.1)

## 2021-12-02 MED ORDER — PALONOSETRON HCL INJECTION 0.25 MG/5ML
0.2500 mg | Freq: Once | INTRAVENOUS | Status: AC
  Administered 2021-12-02: 0.25 mg via INTRAVENOUS
  Filled 2021-12-02: qty 5

## 2021-12-02 MED ORDER — SODIUM CHLORIDE 0.9 % IV SOLN
150.0000 mg | Freq: Once | INTRAVENOUS | Status: AC
  Administered 2021-12-02: 150 mg via INTRAVENOUS
  Filled 2021-12-02: qty 150

## 2021-12-02 MED ORDER — DEXTROSE 5 % IV SOLN: Freq: Once | INTRAVENOUS | Status: AC

## 2021-12-02 MED ORDER — ONDANSETRON HCL 4 MG/2ML IJ SOLN: 8.0000 mg | Freq: Once | INTRAMUSCULAR | Status: DC

## 2021-12-02 MED ORDER — OXALIPLATIN CHEMO INJECTION 100 MG/20ML
110.0000 mg/m2 | Freq: Once | INTRAVENOUS | Status: AC
  Administered 2021-12-02: 230 mg via INTRAVENOUS
  Filled 2021-12-02: qty 40

## 2021-12-02 MED ORDER — SODIUM CHLORIDE 0.9 % IV SOLN
10.0000 mg | Freq: Once | INTRAVENOUS | Status: AC
  Administered 2021-12-02: 10 mg via INTRAVENOUS
  Filled 2021-12-02: qty 10

## 2021-12-02 NOTE — Patient Instructions (Signed)
[facility] MEDICAL ONCOLOGY   ?Discharge Instructions: ?Thank you for choosing [facility] to provide your oncology and hematology care.  ? ?If you have a lab appointment with the Cancer Center, please go directly to the Cancer Center and check in at the registration area. ?  ?Wear comfortable clothing and clothing appropriate for easy access to any Portacath or PICC line.  ? ?We strive to give you quality time with your provider. You may need to reschedule your appointment if you arrive late (15 or more minutes).  Arriving late affects you and other patients whose appointments are after yours.  Also, if you miss three or more appointments without notifying the office, you may be dismissed from the clinic at the provider?s discretion.    ?  ?For prescription refill requests, have your pharmacy contact our office and allow 72 hours for refills to be completed.   ? ?Today you received the following chemotherapy and/or immunotherapy agents: oxaliplatin    ?  ?To help prevent nausea and vomiting after your treatment, we encourage you to take your nausea medication as directed. ? ?BELOW ARE SYMPTOMS THAT SHOULD BE REPORTED IMMEDIATELY: ?*FEVER GREATER THAN 100.4 F (38 ?C) OR HIGHER ?*CHILLS OR SWEATING ?*NAUSEA AND VOMITING THAT IS NOT CONTROLLED WITH YOUR NAUSEA MEDICATION ?*UNUSUAL SHORTNESS OF BREATH ?*UNUSUAL BRUISING OR BLEEDING ?*URINARY PROBLEMS (pain or burning when urinating, or frequent urination) ?*BOWEL PROBLEMS (unusual diarrhea, constipation, pain near the anus) ?TENDERNESS IN MOUTH AND THROAT WITH OR WITHOUT PRESENCE OF ULCERS (sore throat, sores in mouth, or a toothache) ?UNUSUAL RASH, SWELLING OR PAIN  ?UNUSUAL VAGINAL DISCHARGE OR ITCHING  ? ?Items with * indicate a potential emergency and should be followed up as soon as possible or go to the Emergency Department if any problems should occur. ? ?Please show the CHEMOTHERAPY ALERT CARD or IMMUNOTHERAPY ALERT CARD at check-in  to the Emergency Department and triage nurse. ? ?Should you have questions after your visit or need to cancel or reschedule your appointment, please contact [facility] MEDICAL ONCOLOG[facility]  Dept: [phone]  and follow the prompts.  Office hours are 8:00 a.m. to 4:30 p.m. Monday - Friday. Please note that voicemails left after 4:00 p.m. may not be returned until the following business day.  We are closed weekends and major holidays. You have access to a nurse at all times for urgent questions. Please call the main number to the clinic Dept: [phone] and follow the prompts. ? ? ?For any non-urgent questions, you may also contact your provider using MyChart. We now offer e-Visits for anyone 18 and older to request care online for non-urgent symptoms. For details visit [email or web address]. ?  ?Also download the MyChart app! Go to the app store, search "MyChart", open the app, select , and log in with your MyChart username and password. ? ?Due to Covid, a mask is required upon entering the hospital/clinic. If you do not have a mask, one will be given to you upon arrival. For doctor visits, patients may have 1 support person aged 18 or older with them. For treatment visits, patients cannot have anyone with them due to current Covid guidelines and our immunocompromised population.  ? ?

## 2021-12-02 NOTE — Progress Notes (Signed)
Ok to proceed with ANC of 1.1 today per Dr.Feng

## 2021-12-02 NOTE — Progress Notes (Signed)
[facility]   [phone] [phone]   Clinic Follow up Note   Patient Care Team: Patient, No Pcp Per (Inactive) as PCP - General (General Practice) Lavena Bullion, DO as Consulting Physician (Gastroenterology) Ileana Roup, MD as Consulting Physician (General Surgery) Truitt Merle, MD as Consulting Physician (Hematology) Alla Feeling, NP as Nurse Practitioner (Nurse Practitioner)  Date of Service:  12/02/2021  CHIEF COMPLAINT: f/u of colon cancer  CURRENT THERAPY:  Adjuvant CAPOX, with Xeloda 1570m AM, 20023mPM 14 days on/7 days off, started 09/30/21  ASSESSMENT & PLAN:  Ethan Martin a [age].o. male with   1. 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 Baseline CEA normal, staging work-up was negative for distant metastasis -He underwent surgical resection 08/31/2021 by Dr. WhDema Severinpath showed: 4 cm invasive moderately differentiated adenocarcinoma invading into subserosa; metastatic carcinoma in 2/22 lymph nodes with lymphovascular invasion; positive radial margin.  There was no perineural invasion or perforation.   -he began CAPOX, with Xeloda 1500 mg in the morning, 2000 mg in the evening for 14 days on/7 days off, on 09/30/21. -her tolerated cycle 1 with some difficulty; he reports nausea, cold sensitivity, taste change, and fatigue. He again experienced nausea but also with one instance of vomiting after C2. Oral Emend and dexa were called in for him for cycle 3 but he did not use. He tolerated cycle 3 chemotherapy better with IV fluids after chemo. -labs reviewed, adequate to proceed with final oxaliplatin today. -He is scheduled to meet Dr. MoLisbeth Renshawn 01/04/22. I will reach out to Dr. MoLisbeth Renshawo see if he  feels concurrent Xeloda is needed.  -f/u in 6 weeks  --I discussed the risk of cancer recurrence in the future. I discussed the surveillance plan, which is a physical exam and lab test (including CBC, CMP and CEA) every 3 months for the first 2 years, then every 6-12 months, colonoscopy in one year, and surveilliance CT scan every 6-12 month for up to 5 year.     2. Symptom Management: Nausea with Vomiting, Cold Sensitivity, Fatigue, Taste Change -overall manageable, felt better with IV fluids after chemo last cycle    3. Iron and B12 deficiency anemia -Secondary to #1 -Work-up 07/19/21 showed B12 125, ferritin 6, hemoglobin 10 -He began oral B12 and iron supplements, tolerating well -I previously recommend IV iron and B12 injections, he declined. -hgb stable 11.6 today (12/02/21)   4. Family history -He has 1 healthy daughter age 6848we discussed beginning screening in her early 4032sue to patient's young age at diagnosis -Given no other strong family history he likely does not need genetic testing   5. Health maintenance -I encouraged a healthy active lifestyle with normal diet, hydration, avoiding smoking, limiting alcohol -He declines flu or COVID vaccines, I previously reviewed infection precautions especially on chemo -he is scheduled for repeat/complete colonoscopy on 12/23/21 with Dr. CiBryan Lemma   PLAN: -proceed with last cycle CAPOX today             -IVF 2hr tomorrow -colonoscopy 12/23/21 -meet with Dr. MoLisbeth Renshaw/15/23 to prepare adjuvant radiation    No problem-specific Assessment & Plan notes found for this encounter.   SUMMARY OF ONCOLOGIC HISTORY: Oncology History  [place]  Diagnostic EGD by Dr. [name] - Normal esophagus. - Z-line regular, 40 cm from the incisors. - Normal stomach. Biopsied. - Normal examined duodenum. Biopsied. Colonoscopy - Hemorrhoids found on perianal exam. - Likely malignant  completely obstructing tumor in the sigmoid colon. Biopsied. Tattooed. - Non-bleeding internal hemorrhoids.   07/20/2021 Initial Biopsy   Diagnosis 1. Duodenum, Biopsy - BENIGN DUODENAL MUCOSA - NO ACUTE INFLAMMATION, VILLOUS BLUNTING OR INCREASED INTRAEPITHELIAL LYMPHOCYTES 2. Stomach, biopsy - MILD CHRONIC GASTRITIS WITH REACTIVE CHANGES - NO H. PYLORI OR INTESTINAL METAPLASIA IDENTIFIED - SEE COMMENT 3. Sigmoid Colon Biopsy - ADENOCARCINOMA - SEE COMMENT Microscopic Comment 2. H. pylori immunohistochemistry is NEGATIVE for microorganisms.   07/20/2021 Tumor Marker   CEA normal 3.2   07/28/2021 Imaging   Staging CT CAP IMPRESSION: 1. Circumferential wall thickening of the distal sigmoid colon, in keeping with primary colon malignancy identified by colonoscopy. 2. There is some nodularity in the adjacent mesocolon about the inferior mesenteric artery, as well as prominent subcentimeter lymph nodes, worrisome for early nodal or soft tissue metastatic disease. 3. No evidence of distant metastatic disease in the chest, abdomen, or pelvis.   08/31/2021 Cancer Staging   Staging form: Colon and Rectum, AJCC 8th Edition - Pathologic stage from 08/31/2021: Stage IIIB (pT3, pN1b, cM0) - Signed by Alla Feeling, NP on 09/19/2021 Stage prefix: Initial diagnosis Histologic grading system: 4 grade system Histologic grade (G): G2 Laterality: Left Lymph-vascular invasion (LVI): LVI present/identified, NOS Carcinoembryonic antigen (CEA) (ng/mL): 3.2 Perineural invasion (PNI): Absent    08/31/2021 Definitive Surgery   PREOP DIAGNOSIS: sigmoid colon cancer POSTOP DIAGNOSIS: Rectosigmoid colon cancer PROCEDURE:  Robotic assisted low anterior resection with double stapled colorectal anastomosis Intraoperative assessment of perfusion (ICG) Flexible sigmoidoscopy Bilateral transversus abdominus plane blocks SURGEON: Sharon Mt. Dema Severin, MD ASSISTANT: Leighton Ruff, MD   07/23/7509 Pathology  Results   FINAL MICROSCOPIC DIAGNOSIS: A. COLON, RECTOSIGMOID, RESECTION: - Invasive moderately differentiated adenocarcinoma, 4 cm, involving distal sigmoid colon - Carcinoma invades into subserosa - Inked radial margin is positive for carcinoma - Metastatic carcinoma to two of twenty-two lymph nodes (2/22) - See oncology table B. FINAL DISTAL MARGIN: - Colonic donut, negative for carcinoma pT3pN1b, stage IIIb MMR IHC normal MSI-stable   09/19/2021 Initial Diagnosis   Malignant neoplasm of sigmoid colon ([place])   09/30/2021 -  Chemotherapy   Patient is on Treatment Plan : COLORECTAL Xelox (Capeox) q21d        INTERVAL HISTORY:  Ethan Martin is here for a follow up of colon cancer. He was last seen by me on 11/21/21. He presents to the clinic alone. He reports he tolerated last cycle much better with the IVF session.   All other systems were reviewed with the patient and are negative.  MEDICAL HISTORY:  Past Medical History:  Diagnosis Date   Arthritis    Cancer ([place])    Cataract    GERD (gastroesophageal reflux disease)     SURGICAL HISTORY: Past Surgical History:  Procedure Laterality Date   FLEXIBLE SIGMOIDOSCOPY N/A 08/31/2021   Procedure: FLEXIBLE SIGMOIDOSCOPY;  Surgeon: Ileana Roup, MD;  Location: [facility];  Service: General;  Laterality: N/A;   NOSE SURGERY     WISDOM TOOTH EXTRACTION     1988    I have reviewed the social history and family history with the patient and they are unchanged from previous note.  ALLERGIES:  has No Known Allergies.  MEDICATIONS:  Current Outpatient Medications  Medication Sig Dispense Refill   aprepitant (  EMEND) 80 MG capsule Take 1 capsule (80 mg total) by mouth daily. Start the day after chemotherapy. 2 capsule 0   capecitabine (XELODA) 500 MG tablet TAKE 3 TABLETS BY MOUTH IN THE  MORNING AND 4 TABLETS BY MOUTH  IN THE EVENING WITHIN 30 MINUTES AFTER A MEAL FOR 14 DAYS ON THEN 7 DAYS OFF EVERY 21 DAYS 98 tablet 0    dexamethasone (DECADRON) 4 MG tablet Take 1 tablet (4 mg total) by mouth daily. Take for 3-5 days after chemo for nausea 20 tablet 0   ferrous sulfate 325 (65 FE) MG EC tablet TAKE 1 TABLET (325 MG TOTAL) BY MOUTH 2 (TWO) TIMES DAILY. TAKE WITH VITAMIN C OR ORANGE JUICE PLEASE. TAKE IRON SUPPLEMENT 2 HOURS BEFORE OR 4 HOURS AFTER PPI OR OTHER ANTACIDS 120 tablet 1   ondansetron (ZOFRAN) 8 MG tablet Take 1 tablet (8 mg total) by mouth 2 (two) times daily as needed for refractory nausea / vomiting. Start on day 3 after chemotherapy. 30 tablet 1   prochlorperazine (COMPAZINE) 10 MG tablet Take 1 tablet (10 mg total) by mouth every 6 (six) hours as needed (Nausea or vomiting). 30 tablet 2   scopolamine (TRANSDERM-SCOP) 1 MG/3DAYS Place 1 patch (1.5 mg total) onto the skin every 3 (three) days. 4 patch 0   Sod Picosulfate-Mag Ox-Cit Acd (CLENPIQ) 10-3.5-12 MG-GM -GM/160ML SOLN Take 1 kit by mouth as directed. 320 mL 0   vitamin B-12 (CYANOCOBALAMIN) 1000 MCG tablet Take 1 tablet (1,000 mcg total) by mouth daily. 30 tablet 6   No current facility-administered medications for this visit.   Facility-Administered Medications Ordered in Other Visits  Medication Dose Route Frequency Provider Last Rate Last Admin   [START ON 12/03/2021] ondansetron (ZOFRAN) injection 8 mg  8 mg Intravenous Once Truitt Merle, MD        PHYSICAL EXAMINATION: ECOG PERFORMANCE STATUS: 1 - Symptomatic but completely ambulatory  Vitals:   12/02/21 0956  BP: 140/71  Pulse: 72  SpO2: 98%   Wt Readings from Last 3 Encounters:  12/02/21 190 lb 1 oz (86.2 kg)  11/21/21 186 lb 12.8 oz (84.7 kg)  11/14/21 180 lb 6.4 oz (81.8 kg)     GENERAL:alert, no distress and comfortable SKIN: skin color normal, no rashes or significant lesions EYES: normal, Conjunctiva are pink and non-injected, sclera clear  NEURO: alert & oriented x 3 with fluent speech  LABORATORY DATA:  I have reviewed the data as listed CBC Latest Ref Rng & Units  12/02/2021 11/21/2021 11/11/2021  WBC 4.0 - 10.5 K/uL 2.9(L) 4.7 3.6(L)  Hemoglobin 13.0 - 17.0 g/dL 11.6(L) 11.3(L) 11.5(L)  Hematocrit 39.0 - 52.0 % 37.0(L) 35.6(L) 36.7(L)  Platelets 150 - 400 K/uL 208 210 188     CMP Latest Ref Rng & Units 12/02/2021 11/21/2021 11/11/2021  Glucose 70 - 99 mg/dL 158(H) 67(L) 130(H)  BUN 6 - 20 mg/dL 6 9 8   Creatinine 0.61 - 1.24 mg/dL 0.85 0.80 0.85  Sodium 135 - 145 mmol/L 141 142 140  Potassium 3.5 - 5.1 mmol/L 3.4(L) 3.8 3.4(L)  Chloride 98 - 111 mmol/L 107 107 106  CO2 22 - 32 mmol/L 27 29 25   Calcium 8.9 - 10.3 mg/dL 8.9 9.3 9.1  Total Protein 6.5 - 8.1 g/dL 7.4 7.9 7.8  Total Bilirubin 0.3 - 1.2 mg/dL 0.4 0.4 0.4  Alkaline Phos 38 - 126 U/L 81 76 77  AST 15 - 41 U/L 26 18 51(H)  ALT 0 - 44 U/L  19 16 57(H)      RADIOGRAPHIC STUDIES: I have personally reviewed the radiological images as listed and agreed with the findings in the report. No results found.    No orders of the defined types were placed in this encounter.  All questions were answered. The patient knows to call the clinic with any problems, questions or concerns. No barriers to learning was detected. The total time spent in the appointment was 30 minutes.     Truitt Merle, MD 12/02/2021   I, Wilburn Mylar, am acting as scribe for Truitt Merle, MD.   I have reviewed the above documentation for accuracy and completeness, and I agree with the above.

## 2021-12-03 ENCOUNTER — Inpatient Hospital Stay: Payer: 59

## 2021-12-03 ENCOUNTER — Other Ambulatory Visit: Payer: Self-pay

## 2021-12-03 VITALS — BP 135/64 | HR 55 | Temp 99.0°F | Resp 17

## 2021-12-03 DIAGNOSIS — Z5111 Encounter for antineoplastic chemotherapy: Secondary | ICD-10-CM | POA: Diagnosis not present

## 2021-12-03 DIAGNOSIS — C187 Malignant neoplasm of sigmoid colon: Secondary | ICD-10-CM

## 2021-12-03 DIAGNOSIS — Z9049 Acquired absence of other specified parts of digestive tract: Secondary | ICD-10-CM

## 2021-12-03 DIAGNOSIS — E860 Dehydration: Secondary | ICD-10-CM

## 2021-12-03 MED ORDER — SODIUM CHLORIDE 0.9 % IV SOLN: Freq: Once | INTRAVENOUS | Status: AC

## 2021-12-03 NOTE — Patient Instructions (Signed)

## 2021-12-05 ENCOUNTER — Telehealth: Payer: Self-pay | Admitting: Hematology

## 2021-12-05 NOTE — Telephone Encounter (Signed)
Scheduled follow-up appointment per 2/10 los. Patient is aware.

## 2021-12-16 ENCOUNTER — Other Ambulatory Visit: Payer: Self-pay | Admitting: Hematology

## 2021-12-16 DIAGNOSIS — C187 Malignant neoplasm of sigmoid colon: Secondary | ICD-10-CM

## 2021-12-20 ENCOUNTER — Other Ambulatory Visit (HOSPITAL_COMMUNITY): Payer: Self-pay

## 2021-12-23 ENCOUNTER — Encounter: Payer: 59 | Admitting: Gastroenterology

## 2021-12-27 ENCOUNTER — Telehealth: Payer: Self-pay | Admitting: *Deleted

## 2021-12-27 NOTE — Telephone Encounter (Signed)
"  Mr. Ninfa Meeker [phone]), [employer] calling about my employee Joeph Szatkowski.  What do I need to do to obtain FMLA for them.  Sent about a month ago.  Does the provider submit this?" ? ?No FMLA form yet received per Excel tracker.  Asked confirmation of fax number used and form type (WH-380-E ) submitted.  Advised of forms process of 7-10 business days (14 calendar).  Signed release of authorization required. ?Employer states "Will e-mail patient to contact [employer]".       ?

## 2022-01-02 NOTE — Progress Notes (Incomplete)
GI Location of Tumor / Histology: Sigmoid Colon  [name] presented with hematochezia and iron deficiency anemia.  Colonoscopy: Completely obstructing mass in the sigmoid colon.  Pathology confirmed adenocarcinoma.  Biopsies of Rectosigmoid Mass 08/31/2021    Past/Anticipated interventions by surgeon, if any:  [name] -Surgical Resection 08/31/2021- path showed 4 cm invasive moderately differentiated adenocarcinoma invading into subserosa; metastatic carcinoma in 2/22 lymph nodes with lymphovascular invasion; positive radial margin.  There was no perineural invasion or perforation.  Past/Anticipated interventions by medical oncology, if any:  Dr. Burr Medico 12/02/2021 -CAPOX with Xeloda started 09/30/2021. -He is scheduled to meet Dr. Lisbeth Renshaw on 01/04/22. I will reach out to Dr. Lisbeth Renshaw to see if he feels concurrent Xeloda is needed.  -f/u in 6 weeks    Weight changes, if any:   Bowel/Bladder complaints, if any:   Nausea / Vomiting, if any:   Pain issues, if any:    Any blood per rectum:     SAFETY ISSUES: Prior radiation?  Pacemaker/ICD?  Possible current pregnancy? N/A Is the patient on methotrexate?   Current Complaints/Details:

## 2022-01-03 ENCOUNTER — Encounter: Payer: Self-pay | Admitting: Certified Registered Nurse Anesthetist

## 2022-01-04 ENCOUNTER — Ambulatory Visit
Admission: RE | Admit: 2022-01-04 | Discharge: 2022-01-04 | Disposition: A | Discharge status: Home / Self Care | Payer: 59 | Source: Ambulatory Visit | Attending: Radiation Oncology | Admitting: Radiation Oncology

## 2022-01-04 ENCOUNTER — Telehealth: Payer: Self-pay | Admitting: *Deleted

## 2022-01-04 ENCOUNTER — Other Ambulatory Visit: Payer: Self-pay

## 2022-01-04 ENCOUNTER — Telehealth: Payer: Self-pay | Admitting: Gastroenterology

## 2022-01-04 ENCOUNTER — Encounter: Payer: Self-pay | Admitting: Gastroenterology

## 2022-01-04 ENCOUNTER — Ambulatory Visit (AMBULATORY_SURGERY_CENTER): Payer: 59 | Admitting: Gastroenterology

## 2022-01-04 ENCOUNTER — Encounter: Payer: Self-pay | Admitting: Radiation Oncology

## 2022-01-04 ENCOUNTER — Other Ambulatory Visit: Payer: Self-pay | Admitting: Gastroenterology

## 2022-01-04 VITALS — BP 128/91 | HR 85 | Temp 98.9°F | Resp 20 | Ht 73.0 in | Wt 186.6 lb

## 2022-01-04 VITALS — BP 158/73 | HR 61 | Temp 98.3°F | Resp 19 | Ht 73.0 in | Wt 190.0 lb

## 2022-01-04 DIAGNOSIS — K921 Melena: Secondary | ICD-10-CM

## 2022-01-04 DIAGNOSIS — D509 Iron deficiency anemia, unspecified: Secondary | ICD-10-CM

## 2022-01-04 DIAGNOSIS — D128 Benign neoplasm of rectum: Secondary | ICD-10-CM

## 2022-01-04 DIAGNOSIS — K219 Gastro-esophageal reflux disease without esophagitis: Secondary | ICD-10-CM | POA: Insufficient documentation

## 2022-01-04 DIAGNOSIS — K641 Second degree hemorrhoids: Secondary | ICD-10-CM

## 2022-01-04 DIAGNOSIS — Z85038 Personal history of other malignant neoplasm of large intestine: Secondary | ICD-10-CM

## 2022-01-04 DIAGNOSIS — Z809 Family history of malignant neoplasm, unspecified: Secondary | ICD-10-CM | POA: Diagnosis not present

## 2022-01-04 DIAGNOSIS — C187 Malignant neoplasm of sigmoid colon: Secondary | ICD-10-CM | POA: Insufficient documentation

## 2022-01-04 DIAGNOSIS — K621 Rectal polyp: Secondary | ICD-10-CM

## 2022-01-04 DIAGNOSIS — E538 Deficiency of other specified B group vitamins: Secondary | ICD-10-CM | POA: Diagnosis not present

## 2022-01-04 DIAGNOSIS — Z803 Family history of malignant neoplasm of breast: Secondary | ICD-10-CM | POA: Diagnosis not present

## 2022-01-04 DIAGNOSIS — Z79899 Other long term (current) drug therapy: Secondary | ICD-10-CM | POA: Diagnosis not present

## 2022-01-04 DIAGNOSIS — K634 Enteroptosis: Secondary | ICD-10-CM | POA: Diagnosis not present

## 2022-01-04 DIAGNOSIS — D123 Benign neoplasm of transverse colon: Secondary | ICD-10-CM

## 2022-01-04 MED ORDER — SODIUM CHLORIDE 0.9 % IV SOLN: 500.0000 mL | Freq: Once | INTRAVENOUS | Status: DC

## 2022-01-04 NOTE — Progress Notes (Signed)
Pt's states no medical or surgical changes since previsit or office visit.  ° °Vitals CW °

## 2022-01-04 NOTE — Progress Notes (Signed)
Called to room to assist during endoscopic procedure.  Patient ID and intended procedure confirmed with present staff. Received instructions for my participation in the procedure from the performing physician.  

## 2022-01-04 NOTE — Patient Instructions (Addendum)
Handouts on polyps and hemorrhoids given. ?Await pathology results. ?Resume previous diet and continue present medications. ?Repeat colonoscopy in 1 year for surveillance purposes. ?Follow-up with [name] in [facility], Dr. Burr Medico in Oncology, and Dr. Lisbeth Renshaw in Radiation Oncology as previously scheduled. ? ? ?YOU HAD AN ENDOSCOPIC PROCEDURE TODAY AT [facility]:   Refer to the procedure report that was given to you for any specific questions about what was found during the examination.  If the procedure report does not answer your questions, please call your gastroenterologist to clarify.  If you requested that your care partner not be given the details of your procedure findings, then the procedure report has been included in a sealed envelope for you to review at your convenience later. ? ?YOU SHOULD EXPECT: Some feelings of bloating in the abdomen. Passage of more gas than usual.  Walking can help get rid of the air that was put into your GI tract during the procedure and reduce the bloating. If you had a lower endoscopy (such as a colonoscopy or flexible sigmoidoscopy) you may notice spotting of blood in your stool or on the toilet paper. If you underwent a bowel prep for your procedure, you may not have a normal bowel movement for a few days. ? ?Please Note:  You might notice some irritation and congestion in your nose or some drainage.  This is from the oxygen used during your procedure.  There is no need for concern and it should clear up in a day or so. ? ?SYMPTOMS TO REPORT IMMEDIATELY: ? ?Following lower endoscopy (colonoscopy or flexible sigmoidoscopy): ? Excessive amounts of blood in the stool ? Significant tenderness or worsening of abdominal pains ? Swelling of the abdomen that is new, acute ? Fever of 100?F or higher ? ? ?For urgent or emergent issues, a gastroenterologist can be reached at any hour by calling [phone]. ?Do not use MyChart messaging for urgent concerns.   ? ? ?DIET:  We do recommend a small meal at first, but then you may proceed to your regular diet.  Drink plenty of fluids but you should avoid alcoholic beverages for 24 hours. ? ?ACTIVITY:  You should plan to take it easy for the rest of today and you should NOT DRIVE or use heavy machinery until tomorrow (because of the sedation medicines used during the test).   ? ?FOLLOW UP: ?Our staff will call the number listed on your records 48-72 hours following your procedure to check on you and address any questions or concerns that you may have regarding the information given to you following your procedure. If we do not reach you, we will leave a message.  We will attempt to reach you two times.  During this call, we will ask if you have developed any symptoms of COVID 19. If you develop any symptoms (ie: fever, flu-like symptoms, shortness of breath, cough etc.) before then, please call [phone].  If you test positive for Covid 19 in the 2 weeks post procedure, please call and report this information to [place].   ? ?If any biopsies were taken you will be contacted by phone or by letter within the next 1-3 weeks.  Please call us at [phone] if you have not heard about the biopsies in 3 weeks.  ? ? ?SIGNATURES/CONFIDENTIALITY: ?You and/or your care partner have signed paperwork which will be entered into your electronic medical record.  These signatures attest to the fact that that the information above  on your After Visit Summary has been reviewed and is understood.  Full responsibility of the confidentiality of this discharge information lies with you and/or your care-partner.  ?

## 2022-01-04 NOTE — Op Note (Signed)
[facility] ?Patient Name: Ethan Martin ?Procedure Date: 01/04/2022 2:51 PM ?MRN: [id number] ?Endoscopist: Gerrit Heck , MD ?Age: 56 ?Referring MD:  ?Date of Birth: Aug 20, 1966 ?Gender: Male ?Account #: [id number] ?Procedure:                Colonoscopy ?Indications:              High risk colon cancer surveillance: Personal  ?                          history of colon cancer ?                          56 yo male with hx of moderately differentiated  ?                          adenocarcinoma of sigmoid colon (grade 2, T3pN1bM0,  ?                          stage IIIb, MMR normal, MSI stable). Has iron  ?                          deficiency and B12 deficiency related to his  ?                          malignancy. ?                          - 07/22/2021: Colonoscopy with nontraversable mass  ?                          in sigmoid colon, located 32 cm from anal verge.  ?                          Biopsies confirmed adenocarcinoma. CEA 3.2. ?                          - 08/31/2021: Surgical resection by [name]  ?                          (robotic assisted LAR with anastomosis at 18 cm) ?                          - Treated with CAPOX with Xeloda x3 months, with  ?                          plan to initiate radiation next week ?Medicines:                Monitored Anesthesia Care ?Procedure:                Pre-Anesthesia Assessment: ?                          - Prior to the procedure, a History and Physical  ?  was performed, and patient medications and  ?                          allergies were reviewed. The patient's tolerance of  ?                          previous anesthesia was also reviewed. The risks  ?                          and benefits of the procedure and the sedation  ?                          options and risks were discussed with the patient.  ?                          All questions were answered, and informed consent  ?                          was obtained. Prior  Anticoagulants: The patient has  ?                          taken no previous anticoagulant or antiplatelet  ?                          agents. ASA Grade Assessment: II - A patient with  ?                          mild systemic disease. After reviewing the risks  ?                          and benefits, the patient was deemed in  ?                          satisfactory condition to undergo the procedure. ?                          After obtaining informed consent, the colonoscope  ?                          was passed under direct vision. Throughout the  ?                          procedure, the patient's blood pressure, pulse, and  ?                          oxygen saturations were monitored continuously. The  ?                          CF [id number] #[id number] was introduced through the anus  ?                          and advanced to the the cecum, identified by  ?  appendiceal orifice and ileocecal valve. The  ?                          colonoscopy was performed without difficulty. The  ?                          patient tolerated the procedure well. The quality  ?                          of the bowel preparation was good following copious  ?                          irrigation. The ileocecal valve, appendiceal  ?                          orifice, and rectum were photographed. ?Scope In: 2:55:53 PM ?Scope Out: 3:24:34 PM ?Scope Withdrawal Time: 0 hours 27 minutes 34 seconds  ?Total Procedure Duration: 0 hours 28 minutes 41 seconds  ?Findings:                 The perianal and digital rectal examinations were  ?                          normal. ?                          A 5 mm polyp was found in the proximal transverse  ?                          colon. The polyp was sessile. The polyp was removed  ?                          with a cold snare. Resection and retrieval were  ?                          complete. Estimated blood loss was minimal. ?                          There was evidence of a  prior end-to-end  ?                          low-anterior anastomosis in the distal sigmoid  ?                          colon, located 20 cm from the anal verge. This was  ?                          patent and was characterized by healthy appearing  ?                          mucosa. The anastomosis was traversed. Biopsies  ?                          were taken with a cold forceps for histology.  ?  Estimated blood loss was minimal. ?                          A 3 mm polyp was found in the rectosigmoid, about 2  ?                          cm distal to the anastamosis. The polyp was  ?                          sessile. The polyp was removed with a cold snare.  ?                          Resection and retrieval were complete. Estimated  ?                          blood loss was minimal. ?                          Non-bleeding internal hemorrhoids were found during  ?                          retroflexion. The hemorrhoids were small and Grade  ?                          II (internal hemorrhoids that prolapse but reduce  ?                          spontaneously). ?                          A moderate amount of semi-liquid stool was found in  ?                          the entire colon. Lavage of the area was performed  ?                          using copious amounts of tap water, resulting in  ?                          clearance with good visualization. ?Complications:            No immediate complications. ?Estimated Blood Loss:     Estimated blood loss was minimal. ?Impression:               - One 5 mm polyp in the proximal transverse colon,  ?                          removed with a cold snare. Resected and retrieved. ?                          - Patent end-to-end low-anterior anastomosis,  ?                          located 20 cm from the anal verge, characterized by  ?  healthy appearing mucosa. Biopsied. ?                          - One 3 mm polyp in the  rectosigmoid, removed with  ?                          a cold snare. Resected and retrieved. ?                          - Non-bleeding internal hemorrhoids. ?                          - Stool in the entire examined colon. ?Recommendation:           - Patient has a contact number available for  ?                          emergencies. The signs and symptoms of potential  ?                          delayed complications were discussed with the  ?                          patient. Return to normal activities tomorrow.  ?                          Written discharge instructions were provided to the  ?                          patient. ?                          - Resume previous diet. ?                          - Continue present medications. ?                          - Await pathology results. ?                          - Repeat colonoscopy in 1 year for surveillance. ?                          - Follow-up with [name] in [facility],  ?                          Dr. Burr Medico in Oncology, and Dr. Lisbeth Renshaw in Radiation  ?                          Oncology as previously scheduled. ?Gerrit Heck, MD ?01/04/2022 3:35:19 PM ?

## 2022-01-04 NOTE — Progress Notes (Signed)
?Radiation Oncology         [phone] ?________________________________ ? ?Name: Ethan Martin        MRN: [id number]  ?Date of Service: 01/04/2022 DOB: 04/07/1966 ? ?CC:  Feng, Yan, MD    ? ?REFERRING PHYSICIAN: Feng, Yan, MD ? ? ?DIAGNOSIS: The encounter diagnosis was Malignant neoplasm of sigmoid colon (HCC). ? ? ?HISTORY OF PRESENT ILLNESS: Ethan Martin is a 56 y.o. male seen at the request of Dr. 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 He was found to have hematochezia and iron deficiency anemia and his first colonoscopy showed an obstructing mass in the sigmoid colon and biopsies were consistent with adenocarcinoma. His CEA was normal. He underwent a robotic LAR with lymph node dissection on 08/31/21. Final pathology showed a 4 cm invasive moderately differentiated adenocarcinoma invading into subserosa with metastatic carcinoma in 2 of the 22 sampled lymph nodes. He had a positive radial margin without perineural invasion or perforation. He began adjuvant CAPOX chemotherapy beginning 09/30/21. He completed his 4th cycle on 12/02/21. Given the positive surgical margin, his case was reviewed in multidisciplinary conference and is seen to discuss adjuvant radiotherapy to the rectosigmoid junction for local control. Of note he is going to proceed with colonoscopy this afternoon to completely assess the remaining colon as his tumor prevented the ability to evaluate the remainder at the time of his diagnosis.  ? ? ? ?PREVIOUS RADIATION THERAPY: No ? ? ?PAST MEDICAL HISTORY:  ?Past Medical History:  ?Diagnosis Date  ? Arthritis   ? Cancer (HCC)   ? Cataract   ? GERD (gastroesophageal reflux disease)   ?   ? ? ?PAST SURGICAL HISTORY: ?Past Surgical History:  ?Procedure Laterality Date  ? FLEXIBLE SIGMOIDOSCOPY N/A 08/31/2021  ? Procedure: FLEXIBLE SIGMOIDOSCOPY;  Surgeon: White, Christopher M, MD;  Location: [facility];  Service: General;  Laterality: N/A;  ? NOSE SURGERY    ?  WISDOM TOOTH EXTRACTION    ? 1988  ? ? ? ?FAMILY HISTORY:  ?Family History  ?Problem Relation Age of Onset  ? Healthy Mother   ? Healthy Father   ? Cancer Maternal Aunt   ?     unknown type  ? Cancer Paternal Aunt   ?     breast  ? Colon cancer Neg Hx   ? Esophageal cancer Neg Hx   ? Rectal cancer Neg Hx   ? Stomach cancer Neg Hx   ? ? ? ?SOCIAL HISTORY:  reports that he has never smoked. He has never used smokeless tobacco. He reports that he does not currently use alcohol after a past usage of about 5.0 - 6.0 standard drinks per week. He reports that he does not currently use drugs. The patient is married and lives in [place]. 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  ? ?ALLERGIES: Patient has no known allergies. ? ? ?MEDICATIONS:  ?Current Outpatient Medications  ?Medication Sig Dispense Refill  ? aprepitant (EMEND) 80 MG capsule Take 1 capsule (80 mg total) by mouth daily. Start the day after chemotherapy. 2 capsule 0  ? capecitabine (XELODA) 500 MG tablet TAKE 3 TABLETS BY MOUTH IN THE  MORNING AND 4 TABLETS BY MOUTH  IN THE EVENING WITHIN 30 MINUTES AFTER A MEAL FOR 14 DAYS ON THEN 7 DAYS OFF EVERY 21 DAYS 98 tablet 0  ? dexamethasone (DECADRON) 4 MG tablet Take 1 tablet (4 mg   total) by mouth daily. Take for 3-5 days after chemo for nausea 20 tablet 0  ? ferrous sulfate 325 (65 FE) MG EC tablet TAKE 1 TABLET (325 MG TOTAL) BY MOUTH 2 (TWO) TIMES DAILY. TAKE WITH VITAMIN C OR ORANGE JUICE PLEASE. TAKE IRON SUPPLEMENT 2 HOURS BEFORE OR 4 HOURS AFTER PPI OR OTHER ANTACIDS 120 tablet 1  ? ondansetron (ZOFRAN) 8 MG tablet Take 1 tablet (8 mg total) by mouth 2 (two) times daily as needed for refractory nausea / vomiting. Start on day 3 after chemotherapy. 30 tablet 1  ? prochlorperazine (COMPAZINE) 10 MG tablet Take 1 tablet (10 mg total) by mouth every 6 (six) hours as needed (Nausea or vomiting). 30 tablet 2  ? scopolamine (TRANSDERM-SCOP) 1  MG/3DAYS Place 1 patch (1.5 mg total) onto the skin every 3 (three) days. 4 patch 0  ? Sod Picosulfate-Mag Ox-Cit Acd (CLENPIQ) 10-3.5-12 MG-GM -GM/160ML SOLN Take 1 kit by mouth as directed. 320 mL 0  ? vitamin B-12 (CYANOCOBALAMIN) 1000 MCG tablet Take 1 tablet (1,000 mcg total) by mouth daily. 30 tablet 6  ? ?No current facility-administered medications for this encounter.  ? ? ? ?REVIEW OF SYSTEMS: On review of systems, the patient reports that he is doing okay. He reports 2 stools per day which has been his baseline since surgery. He did have some nausea that at times was difficult to manage during chemotherapy, but dose reductions in xeloda seem to have helped slightly. He denies passing blood or mucous per rectum. No other complaints are verbalized.    ? ?  ? ?PHYSICAL EXAM:  ?Wt Readings from Last 3 Encounters:  ?01/04/22 186 lb 9.6 oz (84.6 kg)  ?12/02/21 190 lb 1 oz (86.2 kg)  ?11/21/21 186 lb 12.8 oz (84.7 kg)  ? ?Temp Readings from Last 3 Encounters:  ?01/04/22 98.9 ?F (37.2 ?C)  ?12/03/21 99 ?F (37.2 ?C) (Oral)  ?12/02/21 98.9 ?F (37.2 ?C) (Oral)  ? ?BP Readings from Last 3 Encounters:  ?01/04/22 (!) 128/91  ?12/03/21 135/64  ?12/02/21 140/71  ? ?Pulse Readings from Last 3 Encounters:  ?01/04/22 85  ?12/03/21 (!) 55  ?12/02/21 72  ? ?Pain Assessment ?Pain Score: 0-No pain/10 ? ?In general this is a well appearing African American in no acute distress. She's alert and oriented x4 and appropriate throughout the examination. Cardiopulmonary assessment is negative for acute distress and she exhibits normal effort.  ? ? ? ?ECOG = 1 ? ?0 - Asymptomatic (Fully active, able to carry on all predisease activities without restriction) ? ?1 - Symptomatic but completely ambulatory (Restricted in physically strenuous activity but ambulatory and able to carry out work of a light or sedentary nature. For example, light housework, office work) ? ?2 - Symptomatic, <50% in bed during the day (Ambulatory and capable of all  self care but unable to carry out any work activities. Up and about more than 50% of waking hours) ? ?3 - Symptomatic, >50% in bed, but not bedbound (Capable of only limited self-care, confined to bed or chair 50% or more of waking hours) ? ?4 - Bedbound (Completely disabled. Cannot carry on any self-care. Totally confined to bed or chair) ? ?5 - Death ? ? Oken MM, Creech RH, Tormey DC, et al. (1982). "Toxicity and response criteria of the Eastern Cooperative Oncology Group". Am. J. Clin. Oncol. 5 (6): 649-55 ? ? ? ?LABORATORY DATA:  ?Lab Results  ?Component Value Date  ? WBC 2.9 (L) 12/02/2021  ? HGB 11.6 (  L) 12/02/2021  ? HCT 37.0 (L) 12/02/2021  ? MCV 80.4 12/02/2021  ? PLT 208 12/02/2021  ? ?Lab Results  ?Component Value Date  ? NA 141 12/02/2021  ? K 3.4 (L) 12/02/2021  ? CL 107 12/02/2021  ? CO2 27 12/02/2021  ? ?Lab Results  ?Component Value Date  ? ALT 19 12/02/2021  ? AST 26 12/02/2021  ? ALKPHOS 81 12/02/2021  ? BILITOT 0.4 12/02/2021  ? ?  ? ?RADIOGRAPHY: No results found. ?   ? ?IMPRESSION/PLAN: ?1. 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 Dr. Moody discusses the pathology findings and reviews the nature of positive surgical margins and the risks of local recurrence. With this feature, Dr. Moody recommends adjuvant radiotherapy to the anastomosis in the pelvis. While some modifications were made to his chemotherapy doses, if he is open to the idea of Xeloda chemosensitization, Dr. Moody believes this would be the most effective modality of treatment with ChemoRT rather than RT alone.  We discussed the risks, benefits, short, and long term effects of radiotherapy, as well as the curative intent, and the patient is interested in proceeding. Dr. Moody discusses the delivery and logistics of radiotherapy and anticipates a course of 5 1/2 weeks of radiotherapy to the pelvis. Written consent is obtained and placed in the chart, a copy was provided to the  patient. The patient will be contacted to coordinate treatment planning by our simulation department. We anticipate starting his treatment on 01/16/22. ? ?In a visit lasting 60 minutes, greater than 50% of the

## 2022-01-04 NOTE — Progress Notes (Signed)
Report given to PACU, vss 

## 2022-01-04 NOTE — Telephone Encounter (Signed)
Called the patient back. He took the Dulcolax and Miralax prep. Letter had Clenpiq listed but reassured the patient that the Miralax prep will be just fine. ?

## 2022-01-04 NOTE — Telephone Encounter (Signed)
Inbound call from patient stating that he as a colonoscopy today at 3:00. Patient stated that he noticed on his prep letter that it stated he needed to take Clenpiq. Patient is seeking advice because he stated that he thought he was only supposed to do Miralax and Dulolax. Please advise.  ?

## 2022-01-04 NOTE — Telephone Encounter (Signed)
Pt called with questions pertaining to alcohol consumption, sex, and working out while on Xeloda. Per recommendations from Dr.Feng and Dr.Moody, advised that pt stays on Xeloda while having radiation, can not consume alcohol while on Xeloda, and to use safety precautions (condoms). VM was left on pt personal VM. Advised to call office if there are any other concerns.  ?

## 2022-01-04 NOTE — Progress Notes (Signed)
? ?GASTROENTEROLOGY PROCEDURE H&P NOTE  ? ?Primary Care Physician: ?Patient, No Pcp Per (Inactive) ? ? ? ?Reason for Procedure:   Colon cancer surveillance ? ?Plan:    Colonoscopy ? ?Patient is appropriate for endoscopic procedure(s) in the ambulatory ([facility]) setting. ? ?The nature of the procedure, as well as the risks, benefits, and alternatives were carefully and thoroughly reviewed with the patient. Ample time for discussion and questions allowed. The patient understood, was satisfied, and agreed to proceed.  ? ? ? ?HPI: ?Ethan Martin is a 56 y.o. male who presents for Colonoscopy for Colon Cancer surveillance.  ? ?Hx of moderately differentiated adenocarcinoma of sigmoid colon (grade 2, T3pN1bM0, stage IIIb, MMR normal, MSI stable).  Has iron deficiency and B12 deficiency related to his malignancy. ?- 07/22/2021: Colonoscopy with nontraversable mass in sigmoid colon, located 32 cm from anal verge.  Biopsies confirmed adenocarcinoma.  CEA 3.2. ?- 07/29/2021: CT C/A/P: Circumferential wall thickening of distal sigmoid colon.  Some nodularity and adjacent mesocolon around the IMA as well as prominent subcentimeter lymph nodes, worrisome for early nodal or soft tissue metastatic disease.  No evidence of distant metastatic disease. ?- 08/31/2021: Surgical resection by [name] (robotic assisted LAR with anastomosis at 18 cm) with metastatic carcinoma in 2/22 lymph nodes with lymphovascular invasion.  Question of positive surgical margin, but dona ("final distal margin") negative for carcinoma. ?- Evaluated by Dr. Burr Medico in hematology/oncology clinic.  Started CAPOX with Xeloda x3 months, then will treat with radiation x5-6 weeks ? ? ? ?Past Medical History:  ?Diagnosis Date  ? Arthritis   ? Cancer ([place]) 07/2021  ? Colon  ? Cataract   ? GERD (gastroesophageal reflux disease)   ? ? ?Past Surgical History:  ?Procedure Laterality Date  ? COLON RESECTION  08/31/2021  ? Colon ca, Dr Barry Brunner  ? COLONOSCOPY  01/04/2022  ?  FLEXIBLE SIGMOIDOSCOPY N/A 08/31/2021  ? Procedure: FLEXIBLE SIGMOIDOSCOPY;  Surgeon: Ileana Roup, MD;  Location: [facility];  Service: General;  Laterality: N/A;  ? NOSE SURGERY    ? WISDOM TOOTH EXTRACTION    ? 1988  ? ? ?Prior to Admission medications   ?Medication Sig Start Date End Date Taking? Authorizing Provider  ?aprepitant (EMEND) 80 MG capsule Take 1 capsule (80 mg total) by mouth daily. Start the day after chemotherapy. 11/12/21   Truitt Merle, MD  ?capecitabine (XELODA) 500 MG tablet TAKE 3 TABLETS BY MOUTH IN THE  MORNING AND 4 TABLETS BY MOUTH  IN THE EVENING WITHIN 30 MINUTES AFTER A MEAL FOR 14 DAYS ON THEN 7 DAYS OFF EVERY 21 DAYS 11/23/21   Truitt Merle, MD  ?dexamethasone (DECADRON) 4 MG tablet Take 1 tablet (4 mg total) by mouth daily. Take for 3-5 days after chemo for nausea 11/11/21   Truitt Merle, MD  ?ferrous sulfate 325 (65 FE) MG EC tablet TAKE 1 TABLET (325 MG TOTAL) BY MOUTH 2 (TWO) TIMES DAILY. TAKE WITH VITAMIN C OR ORANGE JUICE PLEASE. TAKE IRON SUPPLEMENT 2 HOURS BEFORE OR 4 HOURS AFTER PPI OR OTHER ANTACIDS 11/28/21   Lorra Freeman V, DO  ?ondansetron (ZOFRAN) 8 MG tablet Take 1 tablet (8 mg total) by mouth 2 (two) times daily as needed for refractory nausea / vomiting. Start on day 3 after chemotherapy. 09/19/21   Truitt Merle, MD  ?prochlorperazine (COMPAZINE) 10 MG tablet Take 1 tablet (10 mg total) by mouth every 6 (six) hours as needed (Nausea or vomiting). 10/21/21   Truitt Merle, MD  ?scopolamine (TRANSDERM-SCOP)  1 MG/3DAYS Place 1 patch (1.5 mg total) onto the skin every 3 (three) days. 11/14/21   Truitt Merle, MD  ?vitamin B-12 (CYANOCOBALAMIN) 1000 MCG tablet Take 1 tablet (1,000 mcg total) by mouth daily. 07/20/21   Mcgwire Dasaro, Dominic Pea, DO  ? ? ?Current Outpatient Medications  ?Medication Sig Dispense Refill  ? aprepitant (EMEND) 80 MG capsule Take 1 capsule (80 mg total) by mouth daily. Start the day after chemotherapy. 2 capsule 0  ? capecitabine (XELODA) 500 MG tablet TAKE 3 TABLETS BY  MOUTH IN THE  MORNING AND 4 TABLETS BY MOUTH  IN THE EVENING WITHIN 30 MINUTES AFTER A MEAL FOR 14 DAYS ON THEN 7 DAYS OFF EVERY 21 DAYS 98 tablet 0  ? dexamethasone (DECADRON) 4 MG tablet Take 1 tablet (4 mg total) by mouth daily. Take for 3-5 days after chemo for nausea 20 tablet 0  ? ferrous sulfate 325 (65 FE) MG EC tablet TAKE 1 TABLET (325 MG TOTAL) BY MOUTH 2 (TWO) TIMES DAILY. TAKE WITH VITAMIN C OR ORANGE JUICE PLEASE. TAKE IRON SUPPLEMENT 2 HOURS BEFORE OR 4 HOURS AFTER PPI OR OTHER ANTACIDS 120 tablet 1  ? ondansetron (ZOFRAN) 8 MG tablet Take 1 tablet (8 mg total) by mouth 2 (two) times daily as needed for refractory nausea / vomiting. Start on day 3 after chemotherapy. 30 tablet 1  ? prochlorperazine (COMPAZINE) 10 MG tablet Take 1 tablet (10 mg total) by mouth every 6 (six) hours as needed (Nausea or vomiting). 30 tablet 2  ? scopolamine (TRANSDERM-SCOP) 1 MG/3DAYS Place 1 patch (1.5 mg total) onto the skin every 3 (three) days. 4 patch 0  ? vitamin B-12 (CYANOCOBALAMIN) 1000 MCG tablet Take 1 tablet (1,000 mcg total) by mouth daily. 30 tablet 6  ? ?Current Facility-Administered Medications  ?Medication Dose Route Frequency Provider Last Rate Last Admin  ? 0.9 %  sodium chloride infusion  500 mL Intravenous Once Ludell Zacarias V, DO      ? ? ?Allergies as of 01/04/2022  ? (No Known Allergies)  ? ? ?Family History  ?Problem Relation Age of Onset  ? Healthy Mother   ? Healthy Father   ? Cancer Maternal Aunt   ?     unknown type  ? Cancer Paternal Aunt   ?     breast  ? Colon cancer Neg Hx   ? Esophageal cancer Neg Hx   ? Rectal cancer Neg Hx   ? Stomach cancer Neg Hx   ? ? ?Social History  ? ?Socioeconomic History  ? Marital status: Divorced  ?  Spouse name: Not on file  ? Number of children: Not on file  ? Years of education: Not on file  ? Highest education level: Not on file  ?Occupational History  ? Not on file  ?Tobacco Use  ? Smoking status: Never  ? Smokeless tobacco: Never  ?Vaping Use  ?  Vaping Use: Never used  ?Substance and Sexual Activity  ? Alcohol use: Not Currently  ?  Alcohol/week: 5.0 - 6.0 standard drinks  ?  Types: 2 Cans of beer, 3 - 4 Shots of liquor per week  ?  Comment: drinks 2-3 times per week, not since 10/22  ? Drug use: Never  ? Sexual activity: Not Currently  ?Other Topics Concern  ? Not on file  ?Social History Narrative  ? Not on file  ? ?Social Determinants of Health  ? ?Financial Resource Strain: Not on file  ?Food Insecurity: Not on  file  ?Transportation Needs: Not on file  ?Physical Activity: Not on file  ?Stress: Not on file  ?Social Connections: Not on file  ?Intimate Partner Violence: Not on file  ? ? ?Physical Exam: ?Vital signs in last 24 hours: ?@BP  (!) 152/88   Pulse 78   Temp 98.3 ?F (36.8 ?C) (Temporal)   Ht 6' 1"  (1.854 m)   Wt 190 lb (86.2 kg)   SpO2 98%   BMI 25.07 kg/m?  ?GEN: NAD ?EYE: Sclerae anicteric ?ENT: MMM ?CV: Non-tachycardic ?Pulm: CTA b/l ?GI: Soft, NT/ND ?NEURO:  Alert & Oriented x 3 ? ? ?Gerrit Heck, DO ?[facility] ? ? ?01/04/2022 2:47 PM ? ?

## 2022-01-06 ENCOUNTER — Telehealth: Payer: Self-pay

## 2022-01-06 ENCOUNTER — Other Ambulatory Visit: Payer: Self-pay

## 2022-01-06 ENCOUNTER — Ambulatory Visit
Admission: RE | Admit: 2022-01-06 | Discharge: 2022-01-06 | Disposition: A | Discharge status: Home / Self Care | Payer: 59 | Source: Ambulatory Visit | Attending: Radiation Oncology | Admitting: Radiation Oncology

## 2022-01-06 DIAGNOSIS — Z510 Encounter for antineoplastic radiation therapy: Secondary | ICD-10-CM | POA: Diagnosis not present

## 2022-01-06 DIAGNOSIS — D519 Vitamin B12 deficiency anemia, unspecified: Secondary | ICD-10-CM | POA: Insufficient documentation

## 2022-01-06 DIAGNOSIS — C187 Malignant neoplasm of sigmoid colon: Secondary | ICD-10-CM | POA: Insufficient documentation

## 2022-01-06 DIAGNOSIS — D509 Iron deficiency anemia, unspecified: Secondary | ICD-10-CM | POA: Insufficient documentation

## 2022-01-06 NOTE — Telephone Encounter (Signed)
?  Follow up Call- ? ?Call back number 01/04/2022 07/20/2021  ?Post procedure Call Back phone  # [phone]  ?Permission to leave phone message Yes Yes  ?Some recent data might be hidden  ?  ? ?Patient questions: ? ?Do you have a fever, pain , or abdominal swelling? No. ?Pain Score  0 * ? ?Have you tolerated food without any problems? Yes.   ? ?Have you been able to return to your normal activities? Yes.   ? ?Do you have any questions about your discharge instructions: ?Diet   No. ?Medications  No. ?Follow up visit  No. ? ?Do you have questions or concerns about your Care? No. ? ?Actions: ?* If pain score is 4 or above: ?No action needed, pain <4. ? ? ?

## 2022-01-13 ENCOUNTER — Other Ambulatory Visit: Payer: Self-pay

## 2022-01-13 ENCOUNTER — Inpatient Hospital Stay: Payer: 59

## 2022-01-13 ENCOUNTER — Telehealth: Payer: Self-pay

## 2022-01-13 ENCOUNTER — Inpatient Hospital Stay: Payer: 59 | Admitting: Hematology

## 2022-01-13 ENCOUNTER — Telehealth: Payer: Self-pay | Admitting: Hematology

## 2022-01-13 ENCOUNTER — Encounter: Payer: Self-pay | Admitting: Gastroenterology

## 2022-01-13 DIAGNOSIS — Z510 Encounter for antineoplastic radiation therapy: Secondary | ICD-10-CM | POA: Diagnosis not present

## 2022-01-13 NOTE — Telephone Encounter (Signed)
.  Called patient to schedule appointment per 3/24 inbasket, patient is aware of date and time.   ?

## 2022-01-13 NOTE — Telephone Encounter (Signed)
Pt called today regarding his appt with Dr. Burr Medico and to f/u on previous telephone call regarding sex and alcohol consumption.  Pt stated he did not get a return call from Dr. Ernestina Penna office regarding his previous questions.  Informed pt that typically it is not recommended that he consumes alcohol especially while taking Xeloda but if he does no more than 1 glass of wine and nothing stronger.  Informed pt that d/t him taking Xeloda, he will need to have protective sex (wearing a condom) with every sexual encounter and refrain from kissing or exchanging bodily fluids.  Pt verbalized understanding of instructions.  Pt also wanted to know if he could do a telephone visit on his next f/u appt w/Dr. Burr Medico.  Informed pt that he could do a telephone visit with Dr. Burr Medico on Thursday.  Pt had no further questions or concerns at this time. ?

## 2022-01-16 ENCOUNTER — Encounter: Payer: Self-pay | Admitting: Hematology

## 2022-01-16 ENCOUNTER — Ambulatory Visit
Admission: RE | Admit: 2022-01-16 | Discharge: 2022-01-16 | Disposition: A | Discharge status: Home / Self Care | Payer: 59 | Source: Ambulatory Visit | Attending: Radiation Oncology | Admitting: Radiation Oncology

## 2022-01-16 ENCOUNTER — Other Ambulatory Visit: Payer: Self-pay

## 2022-01-16 ENCOUNTER — Inpatient Hospital Stay: Payer: 59

## 2022-01-16 ENCOUNTER — Inpatient Hospital Stay (HOSPITAL_BASED_OUTPATIENT_CLINIC_OR_DEPARTMENT_OTHER): Payer: 59 | Admitting: Hematology

## 2022-01-16 DIAGNOSIS — C19 Malignant neoplasm of rectosigmoid junction: Secondary | ICD-10-CM | POA: Insufficient documentation

## 2022-01-16 DIAGNOSIS — D509 Iron deficiency anemia, unspecified: Secondary | ICD-10-CM | POA: Insufficient documentation

## 2022-01-16 DIAGNOSIS — C779 Secondary and unspecified malignant neoplasm of lymph node, unspecified: Secondary | ICD-10-CM | POA: Insufficient documentation

## 2022-01-16 DIAGNOSIS — C187 Malignant neoplasm of sigmoid colon: Secondary | ICD-10-CM

## 2022-01-16 DIAGNOSIS — Z510 Encounter for antineoplastic radiation therapy: Secondary | ICD-10-CM | POA: Diagnosis not present

## 2022-01-16 DIAGNOSIS — D519 Vitamin B12 deficiency anemia, unspecified: Secondary | ICD-10-CM | POA: Insufficient documentation

## 2022-01-16 LAB — CMP (CANCER CENTER ONLY)
ALT: 10 U/L (ref 0–44)
AST: 17 U/L (ref 15–41)
Albumin: 3.9 g/dL (ref 3.5–5.0)
Alkaline Phosphatase: 98 U/L (ref 38–126)
Anion gap: 6 (ref 5–15)
BUN: 10 mg/dL (ref 6–20)
CO2: 29 mmol/L (ref 22–32)
Calcium: 9.1 mg/dL (ref 8.9–10.3)
Chloride: 106 mmol/L (ref 98–111)
Creatinine: 0.9 mg/dL (ref 0.61–1.24)
GFR, Estimated: >60 mL/min (ref >60)
Glucose, Bld: 99 mg/dL (ref 70–99)
Potassium: 3.9 mmol/L (ref 3.5–5.1)
Sodium: 141 mmol/L (ref 135–145)
Total Bilirubin: 0.4 mg/dL (ref 0.3–1.2)
Total Protein: 7.8 g/dL (ref 6.5–8.1)

## 2022-01-16 LAB — CBC WITH DIFFERENTIAL (CANCER CENTER ONLY)
Abs Immature Granulocytes: 0.01 10*3/uL (ref 0.00–0.07)
Basophils Absolute: 0.1 10*3/uL (ref 0.0–0.1)
Basophils Relative: 1 %
Eosinophils Absolute: 0.1 10*3/uL (ref 0.0–0.5)
Eosinophils Relative: 2 %
HCT: 37.1 % — ABNORMAL LOW (ref 39.0–52.0)
Hemoglobin: 11.8 g/dL — ABNORMAL LOW (ref 13.0–17.0)
Immature Granulocytes: 0 %
Lymphocytes Relative: 36 %
Lymphs Abs: 1.7 10*3/uL (ref 0.7–4.0)
MCH: 26.6 pg (ref 26.0–34.0)
MCHC: 31.8 g/dL (ref 30.0–36.0)
MCV: 83.7 fL (ref 80.0–100.0)
Monocytes Absolute: 0.6 10*3/uL (ref 0.1–1.0)
Monocytes Relative: 12 %
Neutro Abs: 2.4 10*3/uL (ref 1.7–7.7)
Neutrophils Relative %: 49 %
Platelet Count: 268 10*3/uL (ref 150–400)
RBC: 4.43 MIL/uL (ref 4.22–5.81)
RDW: 20 % — ABNORMAL HIGH (ref 11.5–15.5)
WBC Count: 4.8 10*3/uL (ref 4.0–10.5)
nRBC: 0 % (ref 0.0–0.2)

## 2022-01-16 LAB — VITAMIN B12: Vitamin B-12: 233 pg/mL (ref 180–914)

## 2022-01-16 LAB — FERRITIN: Ferritin: 8 ng/mL — ABNORMAL LOW (ref 24–336)

## 2022-01-16 MED ORDER — CAPECITABINE 500 MG PO TABS: ORAL_TABLET | ORAL | 1 refills | Status: DC

## 2022-01-16 NOTE — Progress Notes (Signed)
?[place]   ?Telephone:(336) [phone] [phone]   ?Clinic Follow up Note  ? ?Patient Care Team: ?Patient, No Pcp Per (Inactive) as PCP - General (General Practice) ?Lavena Bullion, DO as Consulting Physician (Gastroenterology) ?Ileana Roup, MD as Consulting Physician (General Surgery) ?Truitt Merle, MD as Consulting Physician (Hematology) ?Alla Feeling, NP as Nurse Practitioner (Nurse Practitioner) ? ?Date of Service:  01/16/2022 ? ?CHIEF COMPLAINT: f/u of colon cancer ? ?CURRENT THERAPY:  ?Concurrent chemoRT with Xeloda, started 01/16/22 ? -Xeloda dose: 1500 mg in AM, 2000 mg in PM for days of radiation only, M-F ? ?ASSESSMENT & PLAN:  ?Ethan Martin is a 56 y.o. male with  ? ?1. 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 Baseline CEA normal, staging work-up was negative for distant metastasis ?-He underwent surgical resection 08/31/2021 by [name], path showed: 4 cm invasive moderately differentiated adenocarcinoma invading into subserosa; metastatic carcinoma in 2/22 lymph nodes with lymphovascular invasion; positive radial margin.  There was no perineural invasion or perforation.   ?-he received 4 cycles CAPOX, with Xeloda 1500 mg in the morning, 2000 mg in the evening for 14 days on/7 days off, on 09/30/21-12/02/21. ?-he had surveillance colonoscopy on 01/04/22 under Dr. [name]. Pathology from transverse colon showed tubular adenoma. Rectal biopsies were benign. ?-he began adjuvant radiation therapy under Dr. Lisbeth Renshaw today, 01/16/22.  I have discussed with Dr. Lisbeth Renshaw, and we recommend concurrent Xeloda with radiation.  I reviewed the Xeloda today, he will start tonight with the left over pills at home.  He will take 3 tablets in the morning and  4 tablets in the evening, Monday through Friday on days of radiation only. ?-f/u in 2 weeks  ? ? ?2. Iron and B12 deficiency anemia ?-Secondary to #1 ?-Work-up 07/19/21 showed B12 125, ferritin 6, hemoglobin 10 ?-He began oral B12 and iron supplements, tolerating well ?-I previously recommend IV iron and B12 injections, he declined. ?-hgb stable 11.8 today (01/16/22), slowly improving off chemo. ?  ?3. Family history ?-He has 1 healthy daughter age 54, we discussed beginning screening in her early 67s due to patient's young age at diagnosis ?-Given no other strong family history he likely does not need genetic testing ?  ?4. Health maintenance ?-I encouraged a healthy active lifestyle with normal diet, hydration, avoiding smoking, limiting alcohol. He currently drinks 2 alcoholic beverages a week. I reviewed the detriments of alcohol on the body. ?-He declines flu or COVID vaccines, I previously reviewed infection precautions especially on chemo ?  ?  ?PLAN: ?-continue daily radiation ?-restart Xeloda, same dose as before, tonight. I refilled today  ?-lab and f/u in 2 and 4 weeks ? ? ?No problem-specific Assessment & Plan notes found for this encounter. ? ? ?SUMMARY OF ONCOLOGIC HISTORY: ?Oncology History  ?Malignant neoplasm of sigmoid colon ([place])  ?07/20/2021 Procedure  ? Diagnostic EGD by Dr. [name] ?- Normal esophagus. ?- Z-line regular, 40 cm from the incisors. ?- Normal stomach. Biopsied. ?- Normal examined duodenum. Biopsied. ?Colonoscopy ?- Hemorrhoids found on perianal exam. ?- Likely malignant completely obstructing tumor in the sigmoid colon. Biopsied. Tattooed. ?- Non-bleeding internal hemorrhoids. ?  ?07/20/2021 Initial Biopsy  ? Diagnosis ?1. Duodenum, Biopsy ?- BENIGN DUODENAL MUCOSA ?- NO ACUTE INFLAMMATION, VILLOUS BLUNTING OR INCREASED INTRAEPITHELIAL LYMPHOCYTES ?2. Stomach, biopsy ?- MILD CHRONIC GASTRITIS  WITH REACTIVE CHANGES ?- NO H. PYLORI OR INTESTINAL METAPLASIA IDENTIFIED ?- SEE  COMMENT ?3. Sigmoid Colon Biopsy ?- ADENOCARCINOMA ?- SEE COMMENT ?Microscopic Comment ?2. H. pylori immunohistochemistry is NEGATIVE for microorganisms. ?  ?07/20/2021 Tumor Marker  ? CEA normal 3.2 ?  ?07/28/2021 Imaging  ? Staging CT CAP IMPRESSION: ?1. Circumferential wall thickening of the distal sigmoid colon, in keeping with primary colon malignancy identified by colonoscopy. ?2. There is some nodularity in the adjacent mesocolon about the inferior mesenteric artery, as well as prominent subcentimeter lymph nodes, worrisome for early nodal or soft tissue metastatic disease. ?3. No evidence of distant metastatic disease in the chest, abdomen, or pelvis. ?  ?08/31/2021 Cancer Staging  ? Staging form: Colon and Rectum, AJCC 8th Edition ?- Pathologic stage from 08/31/2021: Stage IIIB (pT3, pN1b, cM0) - Signed by Alla Feeling, NP on 09/19/2021 ?Stage prefix: Initial diagnosis ?Histologic grading system: 4 grade system ?Histologic grade (G): G2 ?Laterality: Left ?Lymph-vascular invasion (LVI): LVI present/identified, NOS ?Carcinoembryonic antigen (CEA) (ng/mL): 3.2 ?Perineural invasion (PNI): Absent ? ?  ?08/31/2021 Definitive Surgery  ? PREOP DIAGNOSIS: sigmoid colon cancer ?POSTOP DIAGNOSIS: Rectosigmoid colon cancer ?PROCEDURE:  ?Robotic assisted low anterior resection with double stapled colorectal anastomosis ?Intraoperative assessment of perfusion (ICG) ?Flexible sigmoidoscopy ?Bilateral transversus abdominus plane blocks ?SURGEON: Sharon Mt. Dema Severin, MD ?ASSISTANT: Leighton Ruff, MD ?  ?08/31/2021 Pathology Results  ? FINAL MICROSCOPIC DIAGNOSIS: ?A. COLON, RECTOSIGMOID, RESECTION: ?- Invasive moderately differentiated adenocarcinoma, 4 cm, involving distal sigmoid colon ?- Carcinoma invades into subserosa ?- Inked radial margin is positive for carcinoma ?- Metastatic carcinoma to two of twenty-two lymph nodes (2/22) ?- See oncology table ?B. FINAL DISTAL MARGIN: ?- Colonic donut, negative for  carcinoma ?pT3pN1b, stage IIIb ?MMR IHC normal ?MSI-stable ?  ?09/19/2021 Initial Diagnosis  ? Malignant neoplasm of sigmoid colon ([place]) ?  ?09/30/2021 -  Chemotherapy  ? Patient is on Treatment Plan : COLORECTAL Xelox (Capeox) q21d  ?   ?01/04/2022 Procedure  ? Colonoscopy, Dr. [name] ? ?Impression: ?- One 5 mm polyp in the proximal transverse colon, removed with a cold snare. Resected and retrieved. ?- Patent end-to-end low-anterior anastomosis, located 20 cm from the anal verge, characterized by healthy appearing mucosa. Biopsied. ?- One 3 mm polyp in the rectosigmoid, removed with a cold snare. Resected and retrieved. ?- Non-bleeding internal hemorrhoids. ?- Stool in the entire examined colon. ?  ?01/04/2022 Pathology Results  ? Diagnosis ?1. Surgical [P], colon, transverse, polyp (1) ?- TUBULAR ADENOMA WITHOUT HIGH-GRADE DYSPLASIA. ?2. Surgical [P], colon, rectum ?- COLONIC MUCOSA WITH CHANGES CONSISTENT WITH MUCOSAL PROLAPSE. ?- NO ADENOMATOUS CHANGE OR MALIGNANCY. ?3. Surgical [P], colon, rectum, polyp (1) ?- HYPERPLASTIC POLYP (S). ?  ? ? ? ?INTERVAL HISTORY:  ?Ethan Martin is here for a follow up of colon cancer. He was last seen by me on 12/02/21. He presents to the clinic alone. ?He reports confusion regarding when and how to take the Xeloda. He tells me he is receiving different information from different nurses. ?  ?All other systems were reviewed with the patient and are negative. ? ?MEDICAL HISTORY:  ?Past Medical History:  ?Diagnosis Date  ? Anemia 10/2021  ? IDA r/o Chemo, colon ca dx, Vitamin B12 deficiency  ? Arthritis   ? Cancer ([place]) 07/2021  ? Colon  ? Cataract   ? GERD (gastroesophageal reflux disease)   ? ? ?SURGICAL HISTORY: ?Past Surgical History:  ?Procedure Laterality Date  ? COLON RESECTION  08/31/2021  ? Colon ca, Dr Barry Brunner  ?  COLONOSCOPY  01/04/2022  ? COLONOSCOPY WITH PROPOFOL  06/2021  ? FLEXIBLE SIGMOIDOSCOPY N/A 08/31/2021  ? Procedure: FLEXIBLE SIGMOIDOSCOPY;  Surgeon: Ileana Roup, MD;  Location: [facility];  Service: General;  Laterality: N/A;  ? NOSE SURGERY    ? WISDOM TOOTH EXTRACTION    ? 1988  ? ? ?I have reviewed the social history and family history with the patient and they are [place]

## 2022-01-17 ENCOUNTER — Ambulatory Visit
Admission: RE | Admit: 2022-01-17 | Discharge: 2022-01-17 | Disposition: A | Discharge status: Home / Self Care | Payer: 59 | Source: Ambulatory Visit | Attending: Radiation Oncology | Admitting: Radiation Oncology

## 2022-01-17 ENCOUNTER — Other Ambulatory Visit: Payer: Self-pay

## 2022-01-17 DIAGNOSIS — Z510 Encounter for antineoplastic radiation therapy: Secondary | ICD-10-CM | POA: Diagnosis not present

## 2022-01-17 DIAGNOSIS — C187 Malignant neoplasm of sigmoid colon: Secondary | ICD-10-CM

## 2022-01-18 ENCOUNTER — Ambulatory Visit
Admission: RE | Admit: 2022-01-18 | Discharge: 2022-01-18 | Disposition: A | Discharge status: Home / Self Care | Payer: 59 | Source: Ambulatory Visit | Attending: Radiation Oncology | Admitting: Radiation Oncology

## 2022-01-18 DIAGNOSIS — Z510 Encounter for antineoplastic radiation therapy: Secondary | ICD-10-CM | POA: Diagnosis not present

## 2022-01-19 ENCOUNTER — Ambulatory Visit: Payer: 59 | Admitting: Hematology

## 2022-01-19 ENCOUNTER — Other Ambulatory Visit: Payer: 59

## 2022-01-19 ENCOUNTER — Ambulatory Visit
Admission: RE | Admit: 2022-01-19 | Discharge: 2022-01-19 | Disposition: A | Discharge status: Home / Self Care | Payer: 59 | Source: Ambulatory Visit | Attending: Radiation Oncology | Admitting: Radiation Oncology

## 2022-01-19 ENCOUNTER — Other Ambulatory Visit: Payer: Self-pay

## 2022-01-19 DIAGNOSIS — Z510 Encounter for antineoplastic radiation therapy: Secondary | ICD-10-CM | POA: Diagnosis not present

## 2022-01-19 NOTE — Progress Notes (Signed)
Pt here for patient teaching.  Pt given Radiation and You booklet, skin care instructions, and Sonafine.  Reviewed areas of pertinence such as diarrhea, fatigue, hair loss, nausea and vomiting, skin changes, and urinary and bladder changes . Pt able to give teach back of to pat skin, use unscented/gentle soap, use baby wipes, have Imodium on hand, drink plenty of water, and sitz bath,apply Sonafine bid and avoid applying anything to skin within 4 hours of treatment. Pt verbalizes understanding of information given and will contact nursing with any questions or concerns.    Raneisha Bress M. Diane Mochizuki RN, BSN  

## 2022-01-20 ENCOUNTER — Ambulatory Visit: Payer: 59

## 2022-01-20 ENCOUNTER — Ambulatory Visit
Admission: RE | Admit: 2022-01-20 | Discharge: 2022-01-20 | Disposition: A | Discharge status: Home / Self Care | Payer: 59 | Source: Ambulatory Visit | Attending: Radiation Oncology | Admitting: Radiation Oncology

## 2022-01-20 DIAGNOSIS — Z510 Encounter for antineoplastic radiation therapy: Secondary | ICD-10-CM | POA: Diagnosis not present

## 2022-01-20 DIAGNOSIS — C187 Malignant neoplasm of sigmoid colon: Secondary | ICD-10-CM

## 2022-01-20 MED ORDER — SONAFINE EX EMUL
1.0000 "application " | Freq: Once | CUTANEOUS | Status: AC
  Administered 2022-01-20: 1 via TOPICAL

## 2022-01-23 ENCOUNTER — Ambulatory Visit
Admission: RE | Admit: 2022-01-23 | Discharge: 2022-01-23 | Disposition: A | Discharge status: Home / Self Care | Payer: 59 | Source: Ambulatory Visit | Attending: Radiation Oncology | Admitting: Radiation Oncology

## 2022-01-23 ENCOUNTER — Other Ambulatory Visit: Payer: Self-pay

## 2022-01-23 DIAGNOSIS — Z510 Encounter for antineoplastic radiation therapy: Secondary | ICD-10-CM | POA: Insufficient documentation

## 2022-01-23 DIAGNOSIS — D519 Vitamin B12 deficiency anemia, unspecified: Secondary | ICD-10-CM | POA: Diagnosis not present

## 2022-01-23 DIAGNOSIS — C187 Malignant neoplasm of sigmoid colon: Secondary | ICD-10-CM | POA: Insufficient documentation

## 2022-01-23 DIAGNOSIS — D509 Iron deficiency anemia, unspecified: Secondary | ICD-10-CM | POA: Insufficient documentation

## 2022-01-24 ENCOUNTER — Ambulatory Visit
Admission: RE | Admit: 2022-01-24 | Discharge: 2022-01-24 | Disposition: A | Discharge status: Home / Self Care | Payer: 59 | Source: Ambulatory Visit | Attending: Radiation Oncology | Admitting: Radiation Oncology

## 2022-01-24 DIAGNOSIS — Z510 Encounter for antineoplastic radiation therapy: Secondary | ICD-10-CM | POA: Diagnosis not present

## 2022-01-25 ENCOUNTER — Ambulatory Visit
Admission: RE | Admit: 2022-01-25 | Discharge: 2022-01-25 | Disposition: A | Discharge status: Home / Self Care | Payer: 59 | Source: Ambulatory Visit | Attending: Radiation Oncology | Admitting: Radiation Oncology

## 2022-01-25 ENCOUNTER — Other Ambulatory Visit: Payer: Self-pay

## 2022-01-25 DIAGNOSIS — Z510 Encounter for antineoplastic radiation therapy: Secondary | ICD-10-CM | POA: Diagnosis not present

## 2022-01-26 ENCOUNTER — Ambulatory Visit
Admission: RE | Admit: 2022-01-26 | Discharge: 2022-01-26 | Disposition: A | Discharge status: Home / Self Care | Payer: 59 | Source: Ambulatory Visit | Attending: Radiation Oncology | Admitting: Radiation Oncology

## 2022-01-26 DIAGNOSIS — Z510 Encounter for antineoplastic radiation therapy: Secondary | ICD-10-CM | POA: Diagnosis not present

## 2022-01-27 ENCOUNTER — Ambulatory Visit
Admission: RE | Admit: 2022-01-27 | Discharge: 2022-01-27 | Disposition: A | Discharge status: Home / Self Care | Payer: 59 | Source: Ambulatory Visit | Attending: Radiation Oncology | Admitting: Radiation Oncology

## 2022-01-27 ENCOUNTER — Other Ambulatory Visit: Payer: Self-pay

## 2022-01-27 DIAGNOSIS — Z510 Encounter for antineoplastic radiation therapy: Secondary | ICD-10-CM | POA: Diagnosis not present

## 2022-01-30 ENCOUNTER — Telehealth: Payer: Self-pay

## 2022-01-30 ENCOUNTER — Other Ambulatory Visit: Payer: Self-pay

## 2022-01-30 ENCOUNTER — Inpatient Hospital Stay: Payer: 59 | Admitting: Hematology

## 2022-01-30 ENCOUNTER — Ambulatory Visit
Admission: RE | Admit: 2022-01-30 | Discharge: 2022-01-30 | Disposition: A | Discharge status: Home / Self Care | Payer: 59 | Source: Ambulatory Visit | Attending: Radiation Oncology | Admitting: Radiation Oncology

## 2022-01-30 ENCOUNTER — Inpatient Hospital Stay: Payer: 59 | Attending: Nurse Practitioner

## 2022-01-30 DIAGNOSIS — D519 Vitamin B12 deficiency anemia, unspecified: Secondary | ICD-10-CM | POA: Insufficient documentation

## 2022-01-30 DIAGNOSIS — C187 Malignant neoplasm of sigmoid colon: Secondary | ICD-10-CM

## 2022-01-30 DIAGNOSIS — Z510 Encounter for antineoplastic radiation therapy: Secondary | ICD-10-CM | POA: Diagnosis not present

## 2022-01-30 DIAGNOSIS — D509 Iron deficiency anemia, unspecified: Secondary | ICD-10-CM | POA: Insufficient documentation

## 2022-01-30 LAB — CBC WITH DIFFERENTIAL (CANCER CENTER ONLY)
Abs Immature Granulocytes: 0.02 10*3/uL (ref 0.00–0.07)
Basophils Absolute: 0 10*3/uL (ref 0.0–0.1)
Basophils Relative: 0 %
Eosinophils Absolute: 0.3 10*3/uL (ref 0.0–0.5)
Eosinophils Relative: 6 %
HCT: 37.1 % — ABNORMAL LOW (ref 39.0–52.0)
Hemoglobin: 11.8 g/dL — ABNORMAL LOW (ref 13.0–17.0)
Immature Granulocytes: 0 %
Lymphocytes Relative: 18 %
Lymphs Abs: 0.9 10*3/uL (ref 0.7–4.0)
MCH: 27.6 pg (ref 26.0–34.0)
MCHC: 31.8 g/dL (ref 30.0–36.0)
MCV: 86.7 fL (ref 80.0–100.0)
Monocytes Absolute: 0.5 10*3/uL (ref 0.1–1.0)
Monocytes Relative: 10 %
Neutro Abs: 3.2 10*3/uL (ref 1.7–7.7)
Neutrophils Relative %: 66 %
Platelet Count: 230 10*3/uL (ref 150–400)
RBC: 4.28 MIL/uL (ref 4.22–5.81)
RDW: 19.6 % — ABNORMAL HIGH (ref 11.5–15.5)
WBC Count: 4.8 10*3/uL (ref 4.0–10.5)
nRBC: 0 % (ref 0.0–0.2)

## 2022-01-30 LAB — CMP (CANCER CENTER ONLY)
ALT: 7 U/L (ref 0–44)
AST: 14 U/L — ABNORMAL LOW (ref 15–41)
Albumin: 3.9 g/dL (ref 3.5–5.0)
Alkaline Phosphatase: 95 U/L (ref 38–126)
Anion gap: 5 (ref 5–15)
BUN: 7 mg/dL (ref 6–20)
CO2: 30 mmol/L (ref 22–32)
Calcium: 9 mg/dL (ref 8.9–10.3)
Chloride: 106 mmol/L (ref 98–111)
Creatinine: 0.82 mg/dL (ref 0.61–1.24)
GFR, Estimated: >60 mL/min (ref >60)
Glucose, Bld: 109 mg/dL — ABNORMAL HIGH (ref 70–99)
Potassium: 3.7 mmol/L (ref 3.5–5.1)
Sodium: 141 mmol/L (ref 135–145)
Total Bilirubin: 0.4 mg/dL (ref 0.3–1.2)
Total Protein: 7.5 g/dL (ref 6.5–8.1)

## 2022-01-30 NOTE — Telephone Encounter (Signed)
Spoke with pt via telephone regarding his appt today.  Pt stated he didn't realize that he was scheduled today to see Dr. Burr Medico.  Pt thought he was just scheduled to start radiation today.  Informed pt that he will see Dr. Burr Medico on day of tx and wk 3 of tx.  Pt verbalized understanding.  Pt stated he's on his way now to his appts.  Notified Dr. Burr Medico of situation. ?

## 2022-01-30 NOTE — Progress Notes (Incomplete)
?Ethan Martin   ?Telephone:(336) [phone] [phone]   ?Clinic Follow up Note  ? ?Patient Care Team: ?Patient, No Pcp Per (Inactive) as PCP - General (General Practice) ?Lavena Bullion, DO as Consulting Physician (Gastroenterology) ?Ileana Roup, MD as Consulting Physician (General Surgery) ?Truitt Merle, MD as Consulting Physician (Hematology) ?Alla Feeling, NP as Nurse Practitioner (Nurse Practitioner) ? ?Date of Service:  01/30/2022 ? ?CHIEF COMPLAINT: f/u of colon cancer ? ?CURRENT THERAPY:  ?Concurrent chemoRT with Xeloda, started 01/16/22 ?            -Xeloda dose: 1500 mg in AM, 2000 mg in PM for days of radiation only, M-F ? ?ASSESSMENT & PLAN: *** ?Ethan Martin is a 56 y.o. male with  ? ?1. 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 Baseline CEA normal, staging work-up was negative for distant metastasis ?-He underwent surgical resection 08/31/2021 by [name], path showed: 4 cm invasive moderately differentiated adenocarcinoma invading into subserosa; metastatic carcinoma in 2/22 lymph nodes with lymphovascular invasion; positive radial margin.  There was no perineural invasion or perforation.   ?-he received 4 cycles CAPOX, with Xeloda 1500 mg in the morning, 2000 mg in the evening for 14 days on/7 days off, on 09/30/21-12/02/21. ?-he had surveillance colonoscopy on 01/04/22 under Dr. [name]. Pathology from transverse colon showed tubular adenoma. Rectal biopsies were benign. ?-he began concurrent chemoRT with Xeloda on 01/16/22.  ?-f/u in 2 weeks  ?  ?2. Iron and B12 deficiency anemia ?-Secondary to #1 ?-Work-up 07/19/21 showed B12 125, ferritin 6, hemoglobin 10 ?-He began oral B12 and iron supplements, tolerating well ?-I previously  recommend IV iron and B12 injections, he declined. ?-hgb stable 11.8 today (01/16/22), slowly improving off chemo. ?  ?3. Family history ?-He has 1 healthy daughter age 54, we discussed beginning screening in her early 58s due to patient's young age at diagnosis ?-Given no other strong family history he likely does not need genetic testing ?  ?4. Health maintenance ?-I encouraged a healthy active lifestyle with normal diet, hydration, avoiding smoking, limiting alcohol. He currently drinks 2 alcoholic beverages a week. I reviewed the detriments of alcohol on the body. ?-He declines flu or COVID vaccines, I previously reviewed infection precautions especially on chemo ?  ?  ?PLAN: ?-continue daily radiation ?-restart Xeloda, same dose as before, tonight. I refilled today  ?-lab and f/u in 2 and 4 weeks ? ? ?*** ? ? ?No problem-specific Assessment & Plan notes found for this encounter. ? ? ?SUMMARY OF ONCOLOGIC HISTORY: ?Oncology History  ?Malignant neoplasm of sigmoid colon ([place])  ?07/20/2021 Procedure  ? Diagnostic EGD by Dr. [name] ?- Normal esophagus. ?- Z-line regular, 40 cm from the incisors. ?- Normal stomach. Biopsied. ?- Normal examined duodenum. Biopsied. ?Colonoscopy ?- Hemorrhoids found on perianal exam. ?- Likely malignant completely obstructing tumor in the sigmoid colon. Biopsied. Tattooed. ?- Non-bleeding internal hemorrhoids. ?  ?07/20/2021 Initial Biopsy  ? Diagnosis ?1. Duodenum, Biopsy ?- BENIGN DUODENAL MUCOSA ?- NO ACUTE INFLAMMATION, VILLOUS BLUNTING OR INCREASED INTRAEPITHELIAL LYMPHOCYTES ?2. Stomach, biopsy ?- MILD CHRONIC GASTRITIS WITH REACTIVE CHANGES ?- NO H. PYLORI OR INTESTINAL METAPLASIA IDENTIFIED ?- SEE COMMENT ?3. Sigmoid Colon Biopsy ?- ADENOCARCINOMA ?- SEE COMMENT ?Microscopic Comment ?2. H. pylori immunohistochemistry is NEGATIVE for microorganisms. ?  ?07/20/2021 Tumor Marker  ? CEA  normal 3.2 ?  ?07/28/2021 Imaging  ? Staging CT CAP IMPRESSION: ?1. Circumferential wall  thickening of the distal sigmoid colon, in keeping with primary colon malignancy identified by colonoscopy. ?2. There is some nodularity in the adjacent mesocolon about the inferior mesenteric artery, as well as prominent subcentimeter lymph nodes, worrisome for early nodal or soft tissue metastatic disease. ?3. No evidence of distant metastatic disease in the chest, abdomen, or pelvis. ?  ?08/31/2021 Cancer Staging  ? Staging form: Colon and Rectum, AJCC 8th Edition ?- Pathologic stage from 08/31/2021: Stage IIIB (pT3, pN1b, cM0) - Signed by Alla Feeling, NP on 09/19/2021 ?Stage prefix: Initial diagnosis ?Histologic grading system: 4 grade system ?Histologic grade (G): G2 ?Laterality: Left ?Lymph-vascular invasion (LVI): LVI present/identified, NOS ?Carcinoembryonic antigen (CEA) (ng/mL): 3.2 ?Perineural invasion (PNI): Absent ? ?  ?08/31/2021 Definitive Surgery  ? PREOP DIAGNOSIS: sigmoid colon cancer ?POSTOP DIAGNOSIS: Rectosigmoid colon cancer ?PROCEDURE:  ?Robotic assisted low anterior resection with double stapled colorectal anastomosis ?Intraoperative assessment of perfusion (ICG) ?Flexible sigmoidoscopy ?Bilateral transversus abdominus plane blocks ?SURGEON: Sharon Mt. Dema Severin, MD ?ASSISTANT: Leighton Ruff, MD ?  ?08/31/2021 Pathology Results  ? FINAL MICROSCOPIC DIAGNOSIS: ?A. COLON, RECTOSIGMOID, RESECTION: ?- Invasive moderately differentiated adenocarcinoma, 4 cm, involving distal sigmoid colon ?- Carcinoma invades into subserosa ?- Inked radial margin is positive for carcinoma ?- Metastatic carcinoma to two of twenty-two lymph nodes (2/22) ?- See oncology table ?B. FINAL DISTAL MARGIN: ?- Colonic donut, negative for carcinoma ?pT3pN1b, stage IIIb ?MMR IHC normal ?MSI-stable ?  ?09/19/2021 Initial Diagnosis  ? Malignant neoplasm of sigmoid colon ([place]) ?  ?09/30/2021 -  Chemotherapy  ? Patient is on Treatment Plan : COLORECTAL Xelox (Capeox) q21d  ?   ?01/04/2022 Procedure  ? Colonoscopy, Dr.  [name] ? ?Impression: ?- One 5 mm polyp in the proximal transverse colon, removed with a cold snare. Resected and retrieved. ?- Patent end-to-end low-anterior anastomosis, located 20 cm from the anal verge, characterized by healthy appearing mucosa. Biopsied. ?- One 3 mm polyp in the rectosigmoid, removed with a cold snare. Resected and retrieved. ?- Non-bleeding internal hemorrhoids. ?- Stool in the entire examined colon. ?  ?01/04/2022 Pathology Results  ? Diagnosis ?1. Surgical [P], colon, transverse, polyp (1) ?- TUBULAR ADENOMA WITHOUT HIGH-GRADE DYSPLASIA. ?2. Surgical [P], colon, rectum ?- COLONIC MUCOSA WITH CHANGES CONSISTENT WITH MUCOSAL PROLAPSE. ?- NO ADENOMATOUS CHANGE OR MALIGNANCY. ?3. Surgical [P], colon, rectum, polyp (1) ?- HYPERPLASTIC POLYP (S). ?  ? ? ? ?INTERVAL HISTORY: *** ?[name] is here for a follow up of ***. He was last seen by me on ***. He presents to the clinic {alone/accompanied by}. ?  ?All other systems were reviewed with the patient and are negative. ? ?MEDICAL HISTORY:  ?Past Medical History:  ?Diagnosis Date  ? Anemia 10/2021  ? IDA r/o Chemo, colon ca dx, Vitamin B12 deficiency  ? Arthritis   ? Cancer ([place]) 07/2021  ? Colon  ? Cataract   ? GERD (gastroesophageal reflux disease)   ? ? ?SURGICAL HISTORY: ?Past Surgical History:  ?Procedure Laterality Date  ? COLON RESECTION  08/31/2021  ? Colon ca, Dr Barry Brunner  ? COLONOSCOPY  01/04/2022  ? COLONOSCOPY WITH PROPOFOL  06/2021  ? FLEXIBLE SIGMOIDOSCOPY N/A 08/31/2021  ? Procedure: FLEXIBLE SIGMOIDOSCOPY;  Surgeon: Ileana Roup, MD;  Location: [facility];  Service: General;  Laterality: N/A;  ? NOSE SURGERY    ? WISDOM TOOTH EXTRACTION    ? 1988  ? ? ?I have reviewed the social  history and family history with the patient and they are unchanged from previous note. ? ?ALLERGIES:  has No Known Allergies. ? ?MEDICATIONS:  ?Current Outpatient Medications  ?Medication Sig Dispense Refill  ? aprepitant (EMEND) 80 MG capsule  Take 1 capsule (80 mg total) by mouth daily. Start the day after chemotherapy. 2 capsule 0  ? capecitabine (XELODA) 500 MG tablet Take 3 tabs in morning and 4 tabs in evening, every 10-12 hours, on days with radiation, Monday throu

## 2022-01-31 ENCOUNTER — Ambulatory Visit
Admission: RE | Admit: 2022-01-31 | Discharge: 2022-01-31 | Disposition: A | Discharge status: Home / Self Care | Payer: 59 | Source: Ambulatory Visit | Attending: Radiation Oncology | Admitting: Radiation Oncology

## 2022-01-31 DIAGNOSIS — Z510 Encounter for antineoplastic radiation therapy: Secondary | ICD-10-CM | POA: Diagnosis not present

## 2022-02-01 ENCOUNTER — Ambulatory Visit
Admission: RE | Admit: 2022-02-01 | Discharge: 2022-02-01 | Disposition: A | Discharge status: Home / Self Care | Payer: 59 | Source: Ambulatory Visit | Attending: Radiation Oncology | Admitting: Radiation Oncology

## 2022-02-01 ENCOUNTER — Telehealth: Payer: Self-pay

## 2022-02-01 ENCOUNTER — Encounter: Payer: Self-pay | Admitting: Hematology

## 2022-02-01 ENCOUNTER — Inpatient Hospital Stay (HOSPITAL_BASED_OUTPATIENT_CLINIC_OR_DEPARTMENT_OTHER): Payer: 59 | Admitting: Hematology

## 2022-02-01 ENCOUNTER — Other Ambulatory Visit: Payer: Self-pay

## 2022-02-01 VITALS — BP 137/71 | HR 67 | Temp 98.4°F | Resp 18 | Ht 73.0 in | Wt 196.0 lb

## 2022-02-01 DIAGNOSIS — C187 Malignant neoplasm of sigmoid colon: Secondary | ICD-10-CM

## 2022-02-01 DIAGNOSIS — Z510 Encounter for antineoplastic radiation therapy: Secondary | ICD-10-CM | POA: Diagnosis not present

## 2022-02-01 NOTE — Telephone Encounter (Signed)
Spoke with pt via telephone regarding f/u w/Dr Burr Medico.  Pt stated he did his labs and when he finished with his radiation the radiation nurse told him he was done for the day so he left and went home.  Pt said he could come today after radiation to see Dr. Burr Medico.  Scheduled pt to see Dr. Burr Medico today after radiation tx. ?

## 2022-02-01 NOTE — Progress Notes (Signed)
?[place]   ?Telephone:(336) [phone] [phone]   ?Clinic Follow up Note  ? ?Patient Care Team: ?Patient, No Pcp Per (Inactive) as PCP - General (General Practice) ?Lavena Bullion, DO as Consulting Physician (Gastroenterology) ?Ileana Roup, MD as Consulting Physician (General Surgery) ?Truitt Merle, MD as Consulting Physician (Hematology) ?Alla Feeling, NP as Nurse Practitioner (Nurse Practitioner) ? ?Date of Service:  02/01/2022 ? ?CHIEF COMPLAINT: f/u of colon cancer ? ?CURRENT THERAPY:  ?Concurrent chemoRT with Xeloda, started 01/16/22 ?            -Xeloda dose: 1500 mg in AM, 2000 mg in PM for days of radiation only, M-F ? ?ASSESSMENT & PLAN:  ?Tylan Kinn is a 56 y.o. male with  ? ?1. 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 Baseline CEA normal, staging work-up was negative for distant metastasis ?-He underwent surgical resection 08/31/2021 by [name], path showed: 4 cm invasive moderately differentiated adenocarcinoma invading into subserosa; metastatic carcinoma in 2/22 lymph nodes with lymphovascular invasion; positive radial margin.  There was no perineural invasion or perforation.   ?-he received 4 cycles CAPOX, with Xeloda 1500 mg in the morning, 2000 mg in the evening for 14 days on/7 days off, on 09/30/21 - 12/02/21. ?-he had surveillance colonoscopy on 01/04/22 under Dr. [name]. Pathology from transverse colon showed tubular adenoma. Rectal biopsies were benign. ?-he began concurrent chemoRT with Xeloda on 01/16/22.  ?-He is tolerating treatment well overall ?-Lab reviewed ?-f/u in 2 weeks  ?  ?2. Iron and B12 deficiency anemia ?-Secondary to #1 ?-Work-up 07/19/21 showed B12 125, ferritin 6, hemoglobin 10 ?-He began oral B12 and  iron supplements, tolerating well ?-I previously recommend IV iron and B12 injections, he declined. ?-hgb stable 11.8 on 02/01/22 ?  ?3. Family history ?-He has 1 healthy daughter age 17, we discussed beginning screening in her early 61s due to patient's young age at diagnosis ?-Given no other strong family history he likely does not need genetic testing ?  ?4. Health maintenance ?-I encouraged a healthy active lifestyle with normal diet, hydration, avoiding smoking, limiting alcohol. He currently drinks 2 alcoholic beverages a week. I reviewed the detriments of alcohol on the body. ?-He declines flu or COVID vaccines, I previously reviewed infection precautions especially on chemo ?  ?  ?PLAN: ?-continue daily radiation ?-continue Xeloda Monday through Friday ?-lab and f/u in 2 weeks as scheduled ? ? ?No problem-specific Assessment & Plan notes found for this encounter. ? ? ?SUMMARY OF ONCOLOGIC HISTORY: ?Oncology History  ?Malignant neoplasm of sigmoid colon ([place])  ?07/20/2021 Procedure  ? Diagnostic EGD by Dr. [name] ?- Normal esophagus. ?- Z-line regular, 40 cm from the incisors. ?- Normal stomach. Biopsied. ?- Normal examined duodenum. Biopsied. ?Colonoscopy ?- Hemorrhoids found on perianal exam. ?- Likely malignant completely obstructing tumor in the sigmoid colon. Biopsied. Tattooed. ?- Non-bleeding internal hemorrhoids. ?  ?07/20/2021 Initial Biopsy  ? Diagnosis ?1. Duodenum, Biopsy ?- BENIGN DUODENAL MUCOSA ?- NO ACUTE INFLAMMATION, VILLOUS BLUNTING OR INCREASED INTRAEPITHELIAL LYMPHOCYTES ?2. Stomach, biopsy ?- MILD CHRONIC GASTRITIS WITH REACTIVE CHANGES ?- NO H. PYLORI OR INTESTINAL METAPLASIA IDENTIFIED ?- SEE COMMENT ?3. Sigmoid Colon Biopsy ?- ADENOCARCINOMA ?- SEE COMMENT ?Microscopic Comment ?2. H. pylori immunohistochemistry is NEGATIVE for microorganisms. ?  ?07/20/2021 Tumor Marker  ? CEA normal 3.2 ?  ?  07/28/2021 Imaging  ? Staging CT CAP IMPRESSION: ?1. Circumferential wall thickening of the  distal sigmoid colon, in keeping with primary colon malignancy identified by colonoscopy. ?2. There is some nodularity in the adjacent mesocolon about the inferior mesenteric artery, as well as prominent subcentimeter lymph nodes, worrisome for early nodal or soft tissue metastatic disease. ?3. No evidence of distant metastatic disease in the chest, abdomen, or pelvis. ?  ?08/31/2021 Cancer Staging  ? Staging form: Colon and Rectum, AJCC 8th Edition ?- Pathologic stage from 08/31/2021: Stage IIIB (pT3, pN1b, cM0) - Signed by Alla Feeling, NP on 09/19/2021 ?Stage prefix: Initial diagnosis ?Histologic grading system: 4 grade system ?Histologic grade (G): G2 ?Laterality: Left ?Lymph-vascular invasion (LVI): LVI present/identified, NOS ?Carcinoembryonic antigen (CEA) (ng/mL): 3.2 ?Perineural invasion (PNI): Absent ? ?  ?08/31/2021 Definitive Surgery  ? PREOP DIAGNOSIS: sigmoid colon cancer ?POSTOP DIAGNOSIS: Rectosigmoid colon cancer ?PROCEDURE:  ?Robotic assisted low anterior resection with double stapled colorectal anastomosis ?Intraoperative assessment of perfusion (ICG) ?Flexible sigmoidoscopy ?Bilateral transversus abdominus plane blocks ?SURGEON: Sharon Mt. Dema Severin, MD ?ASSISTANT: Leighton Ruff, MD ?  ?08/31/2021 Pathology Results  ? FINAL MICROSCOPIC DIAGNOSIS: ?A. COLON, RECTOSIGMOID, RESECTION: ?- Invasive moderately differentiated adenocarcinoma, 4 cm, involving distal sigmoid colon ?- Carcinoma invades into subserosa ?- Inked radial margin is positive for carcinoma ?- Metastatic carcinoma to two of twenty-two lymph nodes (2/22) ?- See oncology table ?B. FINAL DISTAL MARGIN: ?- Colonic donut, negative for carcinoma ?pT3pN1b, stage IIIb ?MMR IHC normal ?MSI-stable ?  ?09/19/2021 Initial Diagnosis  ? Malignant neoplasm of sigmoid colon ([place]) ?  ?09/30/2021 -  Chemotherapy  ? Patient is on Treatment Plan : COLORECTAL Xelox (Capeox) q21d  ?   ?01/04/2022 Procedure  ? Colonoscopy, Dr. [name] ? ?Impression: ?-  One 5 mm polyp in the proximal transverse colon, removed with a cold snare. Resected and retrieved. ?- Patent end-to-end low-anterior anastomosis, located 20 cm from the anal verge, characterized by healthy appearing mucosa. Biopsied. ?- One 3 mm polyp in the rectosigmoid, removed with a cold snare. Resected and retrieved. ?- Non-bleeding internal hemorrhoids. ?- Stool in the entire examined colon. ?  ?01/04/2022 Pathology Results  ? Diagnosis ?1. Surgical [P], colon, transverse, polyp (1) ?- TUBULAR ADENOMA WITHOUT HIGH-GRADE DYSPLASIA. ?2. Surgical [P], colon, rectum ?- COLONIC MUCOSA WITH CHANGES CONSISTENT WITH MUCOSAL PROLAPSE. ?- NO ADENOMATOUS CHANGE OR MALIGNANCY. ?3. Surgical [P], colon, rectum, polyp (1) ?- HYPERPLASTIC POLYP (S). ?  ? ? ? ?INTERVAL HISTORY:  ?Hence Derrick is here for a follow up of colon cancer. He was last seen by me 2 weeks ago.  He presents to clinic by himself.  He is tolerating chemo and radiation well, had 1 episode of loose bowel movement, no significant rectal pain, mild fatigue, no other complaints.  He is tolerating oral Xeloda very well. ? ?All other systems were reviewed with the patient and are negative. ? ?MEDICAL HISTORY:  ?Past Medical History:  ?Diagnosis Date  ? Anemia 10/2021  ? IDA r/o Chemo, colon ca dx, Vitamin B12 deficiency  ? Arthritis   ? Cancer ([place]) 07/2021  ? Colon  ? Cataract   ? GERD (gastroesophageal reflux disease)   ? ? ?SURGICAL HISTORY: ?Past Surgical History:  ?Procedure Laterality Date  ? COLON RESECTION  08/31/2021  ? Colon ca, Dr Barry Brunner  ? COLONOSCOPY  01/04/2022  ? COLONOSCOPY WITH PROPOFOL  06/2021  ? FLEXIBLE SIGMOIDOSCOPY N/A 08/31/2021  ? Procedure: FLEXIBLE SIGMOIDOSCOPY;  Surgeon: Ileana Roup, MD;  Location: [facility];  Service: General;  Laterality: N/A;  ? NOSE SURGERY    ? WISDOM TOOTH EXTRACTION    ? 1988  ? ? ?I have reviewed the social history and family history with the patient and they are unchanged from previous  note. ? ?ALLERGIES:  has No Known Allergies. ? ?MEDICATIONS:  ?Current Outpatient Medications  ?Medication Sig Dispense Refill  ? capecitabine (XELODA) 500 MG tablet Take 3 tabs in morning and 4 tabs in evening, every

## 2022-02-02 ENCOUNTER — Ambulatory Visit
Admission: RE | Admit: 2022-02-02 | Discharge: 2022-02-02 | Disposition: A | Discharge status: Home / Self Care | Payer: 59 | Source: Ambulatory Visit | Attending: Radiation Oncology | Admitting: Radiation Oncology

## 2022-02-02 DIAGNOSIS — Z510 Encounter for antineoplastic radiation therapy: Secondary | ICD-10-CM | POA: Diagnosis not present

## 2022-02-03 ENCOUNTER — Other Ambulatory Visit: Payer: Self-pay

## 2022-02-03 ENCOUNTER — Ambulatory Visit
Admission: RE | Admit: 2022-02-03 | Discharge: 2022-02-03 | Disposition: A | Discharge status: Home / Self Care | Payer: 59 | Source: Ambulatory Visit | Attending: Radiation Oncology | Admitting: Radiation Oncology

## 2022-02-03 DIAGNOSIS — Z510 Encounter for antineoplastic radiation therapy: Secondary | ICD-10-CM | POA: Diagnosis not present

## 2022-02-06 ENCOUNTER — Other Ambulatory Visit: Payer: Self-pay

## 2022-02-06 ENCOUNTER — Ambulatory Visit
Admission: RE | Admit: 2022-02-06 | Discharge: 2022-02-06 | Disposition: A | Discharge status: Home / Self Care | Payer: 59 | Source: Ambulatory Visit | Attending: Radiation Oncology | Admitting: Radiation Oncology

## 2022-02-06 DIAGNOSIS — Z510 Encounter for antineoplastic radiation therapy: Secondary | ICD-10-CM | POA: Diagnosis not present

## 2022-02-07 ENCOUNTER — Other Ambulatory Visit: Payer: Self-pay

## 2022-02-07 ENCOUNTER — Ambulatory Visit
Admission: RE | Admit: 2022-02-07 | Discharge: 2022-02-07 | Disposition: A | Discharge status: Home / Self Care | Payer: 59 | Source: Ambulatory Visit | Attending: Radiation Oncology | Admitting: Radiation Oncology

## 2022-02-07 DIAGNOSIS — Z510 Encounter for antineoplastic radiation therapy: Secondary | ICD-10-CM | POA: Diagnosis not present

## 2022-02-07 LAB — RAD ONC ARIA SESSION SUMMARY
Course Elapsed Days: 22
Plan Fractions Treated to Date: 17
Plan Prescribed Dose Per Fraction: 1.8 Gy
Plan Total Fractions Prescribed: 25
Plan Total Prescribed Dose: 45 Gy
Reference Point Dosage Given to Date: 30.6 Gy
Reference Point Session Dosage Given: 1.8 Gy
Session Number: 17

## 2022-02-08 ENCOUNTER — Other Ambulatory Visit: Payer: Self-pay

## 2022-02-08 ENCOUNTER — Ambulatory Visit
Admission: RE | Admit: 2022-02-08 | Discharge: 2022-02-08 | Disposition: A | Discharge status: Home / Self Care | Payer: 59 | Source: Ambulatory Visit | Attending: Radiation Oncology | Admitting: Radiation Oncology

## 2022-02-08 DIAGNOSIS — Z510 Encounter for antineoplastic radiation therapy: Secondary | ICD-10-CM | POA: Diagnosis not present

## 2022-02-08 LAB — RAD ONC ARIA SESSION SUMMARY
Course Elapsed Days: 23
Plan Fractions Treated to Date: 18
Plan Prescribed Dose Per Fraction: 1.8 Gy
Plan Total Fractions Prescribed: 25
Plan Total Prescribed Dose: 45 Gy
Reference Point Dosage Given to Date: 32.4 Gy
Reference Point Session Dosage Given: 1.8 Gy
Session Number: 18

## 2022-02-09 ENCOUNTER — Other Ambulatory Visit: Payer: Self-pay

## 2022-02-09 ENCOUNTER — Ambulatory Visit
Admission: RE | Admit: 2022-02-09 | Discharge: 2022-02-09 | Disposition: A | Discharge status: Home / Self Care | Payer: 59 | Source: Ambulatory Visit | Attending: Radiation Oncology | Admitting: Radiation Oncology

## 2022-02-09 DIAGNOSIS — Z510 Encounter for antineoplastic radiation therapy: Secondary | ICD-10-CM | POA: Diagnosis not present

## 2022-02-09 LAB — RAD ONC ARIA SESSION SUMMARY
Course Elapsed Days: 24
Plan Fractions Treated to Date: 19
Plan Prescribed Dose Per Fraction: 1.8 Gy
Plan Total Fractions Prescribed: 25
Plan Total Prescribed Dose: 45 Gy
Reference Point Dosage Given to Date: 34.2 Gy
Reference Point Session Dosage Given: 1.8 Gy
Session Number: 19

## 2022-02-10 ENCOUNTER — Other Ambulatory Visit: Payer: Self-pay

## 2022-02-10 ENCOUNTER — Ambulatory Visit
Admission: RE | Admit: 2022-02-10 | Discharge: 2022-02-10 | Disposition: A | Discharge status: Home / Self Care | Payer: 59 | Source: Ambulatory Visit | Attending: Radiation Oncology | Admitting: Radiation Oncology

## 2022-02-10 DIAGNOSIS — Z510 Encounter for antineoplastic radiation therapy: Secondary | ICD-10-CM | POA: Diagnosis not present

## 2022-02-10 LAB — RAD ONC ARIA SESSION SUMMARY
Course Elapsed Days: 25
Plan Fractions Treated to Date: 20
Plan Prescribed Dose Per Fraction: 1.8 Gy
Plan Total Fractions Prescribed: 25
Plan Total Prescribed Dose: 45 Gy
Reference Point Dosage Given to Date: 36 Gy
Reference Point Session Dosage Given: 1.8 Gy
Session Number: 20

## 2022-02-13 ENCOUNTER — Other Ambulatory Visit: Payer: Self-pay

## 2022-02-13 ENCOUNTER — Ambulatory Visit
Admission: RE | Admit: 2022-02-13 | Discharge: 2022-02-13 | Disposition: A | Discharge status: Home / Self Care | Payer: 59 | Source: Ambulatory Visit | Attending: Radiation Oncology | Admitting: Radiation Oncology

## 2022-02-13 DIAGNOSIS — Z510 Encounter for antineoplastic radiation therapy: Secondary | ICD-10-CM | POA: Diagnosis not present

## 2022-02-13 LAB — RAD ONC ARIA SESSION SUMMARY
Course Elapsed Days: 28
Plan Fractions Treated to Date: 21
Plan Prescribed Dose Per Fraction: 1.8 Gy
Plan Total Fractions Prescribed: 25
Plan Total Prescribed Dose: 45 Gy
Reference Point Dosage Given to Date: 37.8 Gy
Reference Point Session Dosage Given: 1.8 Gy
Session Number: 21

## 2022-02-14 ENCOUNTER — Encounter: Payer: Self-pay | Admitting: Hematology

## 2022-02-14 ENCOUNTER — Inpatient Hospital Stay: Payer: 59

## 2022-02-14 ENCOUNTER — Other Ambulatory Visit: Payer: Self-pay

## 2022-02-14 ENCOUNTER — Ambulatory Visit
Admission: RE | Admit: 2022-02-14 | Discharge: 2022-02-14 | Disposition: A | Discharge status: Home / Self Care | Payer: 59 | Source: Ambulatory Visit | Attending: Radiation Oncology | Admitting: Radiation Oncology

## 2022-02-14 ENCOUNTER — Inpatient Hospital Stay (HOSPITAL_BASED_OUTPATIENT_CLINIC_OR_DEPARTMENT_OTHER): Payer: 59 | Admitting: Hematology

## 2022-02-14 VITALS — BP 133/81 | HR 65 | Temp 97.8°F | Resp 17 | Ht 73.0 in | Wt 196.4 lb

## 2022-02-14 DIAGNOSIS — C187 Malignant neoplasm of sigmoid colon: Secondary | ICD-10-CM

## 2022-02-14 DIAGNOSIS — Z510 Encounter for antineoplastic radiation therapy: Secondary | ICD-10-CM | POA: Diagnosis not present

## 2022-02-14 LAB — CMP (CANCER CENTER ONLY)
ALT: 9 U/L (ref 0–44)
AST: 15 U/L (ref 15–41)
Albumin: 3.9 g/dL (ref 3.5–5.0)
Alkaline Phosphatase: 93 U/L (ref 38–126)
Anion gap: 7 (ref 5–15)
BUN: 11 mg/dL (ref 6–20)
CO2: 29 mmol/L (ref 22–32)
Calcium: 9.2 mg/dL (ref 8.9–10.3)
Chloride: 106 mmol/L (ref 98–111)
Creatinine: 0.93 mg/dL (ref 0.61–1.24)
GFR, Estimated: >60 mL/min (ref >60)
Glucose, Bld: 87 mg/dL (ref 70–99)
Potassium: 3.5 mmol/L (ref 3.5–5.1)
Sodium: 142 mmol/L (ref 135–145)
Total Bilirubin: 0.6 mg/dL (ref 0.3–1.2)
Total Protein: 7.4 g/dL (ref 6.5–8.1)

## 2022-02-14 LAB — RAD ONC ARIA SESSION SUMMARY
Course Elapsed Days: 29
Plan Fractions Treated to Date: 22
Plan Prescribed Dose Per Fraction: 1.8 Gy
Plan Total Fractions Prescribed: 25
Plan Total Prescribed Dose: 45 Gy
Reference Point Dosage Given to Date: 39.6 Gy
Reference Point Session Dosage Given: 1.8 Gy
Session Number: 22

## 2022-02-14 LAB — CBC WITH DIFFERENTIAL (CANCER CENTER ONLY)
Abs Immature Granulocytes: 0 10*3/uL (ref 0.00–0.07)
Basophils Absolute: 0 10*3/uL (ref 0.0–0.1)
Basophils Relative: 1 %
Eosinophils Absolute: 0.1 10*3/uL (ref 0.0–0.5)
Eosinophils Relative: 4 %
HCT: 35.5 % — ABNORMAL LOW (ref 39.0–52.0)
Hemoglobin: 11.2 g/dL — ABNORMAL LOW (ref 13.0–17.0)
Immature Granulocytes: 0 %
Lymphocytes Relative: 16 %
Lymphs Abs: 0.5 10*3/uL — ABNORMAL LOW (ref 0.7–4.0)
MCH: 27.7 pg (ref 26.0–34.0)
MCHC: 31.5 g/dL (ref 30.0–36.0)
MCV: 87.7 fL (ref 80.0–100.0)
Monocytes Absolute: 0.5 10*3/uL (ref 0.1–1.0)
Monocytes Relative: 17 %
Neutro Abs: 1.9 10*3/uL (ref 1.7–7.7)
Neutrophils Relative %: 62 %
Platelet Count: 139 10*3/uL — ABNORMAL LOW (ref 150–400)
RBC: 4.05 MIL/uL — ABNORMAL LOW (ref 4.22–5.81)
RDW: 19.4 % — ABNORMAL HIGH (ref 11.5–15.5)
WBC Count: 3.1 10*3/uL — ABNORMAL LOW (ref 4.0–10.5)
nRBC: 0 % (ref 0.0–0.2)

## 2022-02-14 NOTE — Progress Notes (Signed)
?[place]   ?Telephone:(336) [phone] [phone]   ?Clinic Follow up Note  ? ?Patient Care Team: ?Patient, No Pcp Per (Inactive) as PCP - General (General Practice) ?Lavena Bullion, DO as Consulting Physician (Gastroenterology) ?Ileana Roup, MD as Consulting Physician (General Surgery) ?Truitt Merle, MD as Consulting Physician (Hematology) ?Alla Feeling, NP as Nurse Practitioner (Nurse Practitioner) ? ?Date of Service:  02/14/2022 ? ?CHIEF COMPLAINT: f/u of colon cancer ? ?CURRENT THERAPY:  ?Concurrent chemoRT with Xeloda, started 01/16/22 ?            -Xeloda dose: 1500 mg in AM, 2000 mg in PM for days of radiation only, M-F ? ?ASSESSMENT & PLAN:  ?Ethan Martin is a 56 y.o. male with  ? ?1. 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 Baseline CEA normal, staging work-up was negative for distant metastasis ?-He underwent surgical resection 08/31/2021 by [name], path showed: 4 cm invasive moderately differentiated adenocarcinoma invading into subserosa; metastatic carcinoma in 2/22 lymph nodes with lymphovascular invasion; positive radial margin.  There was no perineural invasion or perforation.   ?-he received 4 cycles CAPOX, with Xeloda 1500 mg in the morning, 2000 mg in the evening for 14 days on/7 days off, on 09/30/21 - 12/02/21. ?-he had surveillance colonoscopy on 01/04/22 under Dr. [name]. Pathology from transverse colon showed tubular adenoma. Rectal biopsies were benign. ?-he began concurrent chemoRT with Xeloda on 01/16/22.  ?-He is tolerating treatment well overall, plan to finish mid next week ?-Lab reviewed, adequate to continue Xeloda until last day of radiation. ?-We discussed cancer surveillance, plan to repeat CT scan in 6 weeks.  Will continue physical exam and lab test (including CBC, CMP and CEA) every 3 months for the first 2 years, then every 6-12 months, colonoscopy in one year, and surveilliance CT scan every 6-12 month for up to 5 year.  ? ?  ?2. Iron and B12 deficiency anemia ?-Secondary to #1 ?-Work-up 07/19/21 showed B12 125, ferritin 6, hemoglobin 10 ?-He began oral B12 and iron supplements, tolerating well ?-I previously recommend IV iron and B12 injections, he declined. ?-hgb stable 11.8 on 02/01/22 ?  ?3. Family history ?-He has 1 healthy daughter age 73, we discussed beginning screening in her early 66s due to patient's young age at diagnosis ?-Given no other strong family history he likely does not need genetic testing ?  ?4. Health maintenance ?-I encouraged a healthy active lifestyle with normal diet, hydration, avoiding smoking, limiting alcohol. He currently drinks 2 alcoholic beverages a week. I reviewed the detriments of alcohol on the body. ?-He declines flu or COVID vaccines, I previously reviewed infection precautions especially on chemo ?  ?  ?PLAN: ?-continue daily radiation as scheduled until 5/3 ?-continue Xeloda Monday through Friday until 5/3  ?-Follow-up in 6 weeks with labs and CT chest, abdomen pelvis with contrast a few days before  ? ?No problem-specific Assessment & Plan notes found for this encounter. ? ? ?SUMMARY OF ONCOLOGIC HISTORY: ?Oncology History  ?Malignant neoplasm of sigmoid colon ([place])  ?07/20/2021 Procedure  ? Diagnostic EGD by Dr. [name] ?- Normal esophagus. ?- Z-line regular, 40 cm from the incisors. ?- Normal stomach. Biopsied. ?- Normal examined duodenum. Biopsied. ?Colonoscopy ?- Hemorrhoids found on perianal exam. ?- Likely malignant completely obstructing tumor in the sigmoid colon. Biopsied. Tattooed. ?-  Non-bleeding internal hemorrhoids. ?  ?07/20/2021 Initial Biopsy  ? Diagnosis ?1. Duodenum, Biopsy ?- BENIGN DUODENAL MUCOSA ?- NO ACUTE INFLAMMATION, VILLOUS BLUNTING OR INCREASED  INTRAEPITHELIAL LYMPHOCYTES ?2. Stomach, biopsy ?- MILD CHRONIC GASTRITIS WITH REACTIVE CHANGES ?- NO H. PYLORI OR INTESTINAL METAPLASIA IDENTIFIED ?- SEE COMMENT ?3. Sigmoid Colon Biopsy ?- ADENOCARCINOMA ?- SEE COMMENT ?Microscopic Comment ?2. H. pylori immunohistochemistry is NEGATIVE for microorganisms. ?  ?07/20/2021 Tumor Marker  ? CEA normal 3.2 ?  ?07/28/2021 Imaging  ? Staging CT CAP IMPRESSION: ?1. Circumferential wall thickening of the distal sigmoid colon, in keeping with primary colon malignancy identified by colonoscopy. ?2. There is some nodularity in the adjacent mesocolon about the inferior mesenteric artery, as well as prominent subcentimeter lymph nodes, worrisome for early nodal or soft tissue metastatic disease. ?3. No evidence of distant metastatic disease in the chest, abdomen, or pelvis. ?  ?08/31/2021 Cancer Staging  ? Staging form: Colon and Rectum, AJCC 8th Edition ?- Pathologic stage from 08/31/2021: Stage IIIB (pT3, pN1b, cM0) - Signed by Alla Feeling, NP on 09/19/2021 ?Stage prefix: Initial diagnosis ?Histologic grading system: 4 grade system ?Histologic grade (G): G2 ?Laterality: Left ?Lymph-vascular invasion (LVI): LVI present/identified, NOS ?Carcinoembryonic antigen (CEA) (ng/mL): 3.2 ?Perineural invasion (PNI): Absent ? ?  ?08/31/2021 Definitive Surgery  ? PREOP DIAGNOSIS: sigmoid colon cancer ?POSTOP DIAGNOSIS: Rectosigmoid colon cancer ?PROCEDURE:  ?Robotic assisted low anterior resection with double stapled colorectal anastomosis ?Intraoperative assessment of perfusion (ICG) ?Flexible sigmoidoscopy ?Bilateral transversus abdominus plane blocks ?SURGEON: Sharon Mt. Dema Severin, MD ?ASSISTANT: Leighton Ruff, MD ?  ?08/31/2021 Pathology Results  ? FINAL MICROSCOPIC DIAGNOSIS: ?A. COLON, RECTOSIGMOID, RESECTION: ?- Invasive moderately differentiated adenocarcinoma, 4 cm, involving distal sigmoid colon ?- Carcinoma invades into subserosa ?- Inked radial margin is positive for carcinoma ?-  Metastatic carcinoma to two of twenty-two lymph nodes (2/22) ?- See oncology table ?B. FINAL DISTAL MARGIN: ?- Colonic donut, negative for carcinoma ?pT3pN1b, stage IIIb ?MMR IHC normal ?MSI-stable ?  ?09/19/2021 Initial Diagnosis  ? Malignant neoplasm of sigmoid colon ([place]) ? ?  ?09/30/2021 -  Chemotherapy  ? Patient is on Treatment Plan : COLORECTAL Xelox (Capeox) q21d  ? ?  ?  ?01/04/2022 Procedure  ? Colonoscopy, Dr. [name] ? ?Impression: ?- One 5 mm polyp in the proximal transverse colon, removed with a cold snare. Resected and retrieved. ?- Patent end-to-end low-anterior anastomosis, located 20 cm from the anal verge, characterized by healthy appearing mucosa. Biopsied. ?- One 3 mm polyp in the rectosigmoid, removed with a cold snare. Resected and retrieved. ?- Non-bleeding internal hemorrhoids. ?- Stool in the entire examined colon. ?  ?01/04/2022 Pathology Results  ? Diagnosis ?1. Surgical [P], colon, transverse, polyp (1) ?- TUBULAR ADENOMA WITHOUT HIGH-GRADE DYSPLASIA. ?2. Surgical [P], colon, rectum ?- COLONIC MUCOSA WITH CHANGES CONSISTENT WITH MUCOSAL PROLAPSE. ?- NO ADENOMATOUS CHANGE OR MALIGNANCY. ?3. Surgical [P], colon, rectum, polyp (1) ?- HYPERPLASTIC POLYP (S). ?  ? ? ? ?INTERVAL HISTORY:  ?Mitchael Luckey is here for a follow up of colon cancer. He was last seen by me 2 weeks ago. He is tolerating chemoRT well, no diarrhea or rectal discomfort.  He noticed a few skin rash lately, not bothersome.  No other new complaints. ? ?All other systems were reviewed with the patient and are negative. ? ?MEDICAL HISTORY:  ?Past Medical History:  ?Diagnosis Date  ? Anemia 10/2021  ? IDA r/o Chemo, colon ca dx, Vitamin B12 deficiency  ? Arthritis   ? Cancer ([place]) 07/2021  ? Colon  ?  Cataract   ? GERD (gastroesophageal reflux disease)   ? ? ?SURGICAL HISTORY: ?Past Surgical History:  ?Procedure Laterality Date  ? COLON RESECTION  08/31/2021  ? Colon ca, Dr Barry Brunner  ? COLONOSCOPY  01/04/2022  ? COLONOSCOPY  WITH PROPOFOL  06/2021  ? FLEXIBLE SIGMOIDOSCOPY N/A 08/31/2021  ? Procedure: FLEXIBLE SIGMOIDOSCOPY;  Surgeon: Ileana Roup, MD;  Location: [facility];  Service: General;  Laterality: N/A;  ? NOSE S

## 2022-02-15 ENCOUNTER — Ambulatory Visit
Admission: RE | Admit: 2022-02-15 | Discharge: 2022-02-15 | Disposition: A | Discharge status: Home / Self Care | Payer: 59 | Source: Ambulatory Visit | Attending: Radiation Oncology | Admitting: Radiation Oncology

## 2022-02-15 ENCOUNTER — Other Ambulatory Visit: Payer: Self-pay

## 2022-02-15 DIAGNOSIS — Z510 Encounter for antineoplastic radiation therapy: Secondary | ICD-10-CM | POA: Diagnosis not present

## 2022-02-15 LAB — RAD ONC ARIA SESSION SUMMARY
Course Elapsed Days: 30
Plan Fractions Treated to Date: 23
Plan Prescribed Dose Per Fraction: 1.8 Gy
Plan Total Fractions Prescribed: 25
Plan Total Prescribed Dose: 45 Gy
Reference Point Dosage Given to Date: 41.4 Gy
Reference Point Session Dosage Given: 1.8 Gy
Session Number: 23

## 2022-02-16 ENCOUNTER — Ambulatory Visit
Admission: RE | Admit: 2022-02-16 | Discharge: 2022-02-16 | Disposition: A | Discharge status: Home / Self Care | Payer: 59 | Source: Ambulatory Visit | Attending: Radiation Oncology | Admitting: Radiation Oncology

## 2022-02-16 ENCOUNTER — Other Ambulatory Visit: Payer: Self-pay

## 2022-02-16 DIAGNOSIS — Z510 Encounter for antineoplastic radiation therapy: Secondary | ICD-10-CM | POA: Diagnosis not present

## 2022-02-16 LAB — RAD ONC ARIA SESSION SUMMARY
Course Elapsed Days: 31
Plan Fractions Treated to Date: 24
Plan Prescribed Dose Per Fraction: 1.8 Gy
Plan Total Fractions Prescribed: 25
Plan Total Prescribed Dose: 45 Gy
Reference Point Dosage Given to Date: 43.2 Gy
Reference Point Session Dosage Given: 1.8 Gy
Session Number: 24

## 2022-02-17 ENCOUNTER — Ambulatory Visit
Admission: RE | Admit: 2022-02-17 | Discharge: 2022-02-17 | Disposition: A | Discharge status: Home / Self Care | Payer: 59 | Source: Ambulatory Visit | Attending: Radiation Oncology | Admitting: Radiation Oncology

## 2022-02-17 ENCOUNTER — Other Ambulatory Visit: Payer: Self-pay

## 2022-02-17 DIAGNOSIS — Z510 Encounter for antineoplastic radiation therapy: Secondary | ICD-10-CM | POA: Diagnosis not present

## 2022-02-17 LAB — RAD ONC ARIA SESSION SUMMARY
Course Elapsed Days: 32
Plan Fractions Treated to Date: 25
Plan Prescribed Dose Per Fraction: 1.8 Gy
Plan Total Fractions Prescribed: 25
Plan Total Prescribed Dose: 45 Gy
Reference Point Dosage Given to Date: 45 Gy
Reference Point Session Dosage Given: 1.8 Gy
Session Number: 25

## 2022-02-20 ENCOUNTER — Ambulatory Visit
Admission: RE | Admit: 2022-02-20 | Discharge: 2022-02-20 | Disposition: A | Discharge status: Home / Self Care | Payer: 59 | Source: Ambulatory Visit | Attending: Radiation Oncology | Admitting: Radiation Oncology

## 2022-02-20 ENCOUNTER — Other Ambulatory Visit: Payer: Self-pay

## 2022-02-20 DIAGNOSIS — Z510 Encounter for antineoplastic radiation therapy: Secondary | ICD-10-CM | POA: Insufficient documentation

## 2022-02-20 DIAGNOSIS — C187 Malignant neoplasm of sigmoid colon: Secondary | ICD-10-CM | POA: Diagnosis present

## 2022-02-20 DIAGNOSIS — D519 Vitamin B12 deficiency anemia, unspecified: Secondary | ICD-10-CM | POA: Insufficient documentation

## 2022-02-20 DIAGNOSIS — D509 Iron deficiency anemia, unspecified: Secondary | ICD-10-CM | POA: Diagnosis not present

## 2022-02-20 LAB — RAD ONC ARIA SESSION SUMMARY
Course Elapsed Days: 35
Plan Fractions Treated to Date: 1
Plan Prescribed Dose Per Fraction: 1.8 Gy
Plan Total Fractions Prescribed: 3
Plan Total Prescribed Dose: 5.4 Gy
Reference Point Dosage Given to Date: 46.8 Gy
Reference Point Session Dosage Given: 1.8 Gy
Session Number: 26

## 2022-02-21 ENCOUNTER — Ambulatory Visit
Admission: RE | Admit: 2022-02-21 | Discharge: 2022-02-21 | Disposition: A | Discharge status: Home / Self Care | Payer: 59 | Source: Ambulatory Visit | Attending: Radiation Oncology | Admitting: Radiation Oncology

## 2022-02-21 ENCOUNTER — Other Ambulatory Visit: Payer: Self-pay

## 2022-02-21 DIAGNOSIS — Z510 Encounter for antineoplastic radiation therapy: Secondary | ICD-10-CM | POA: Diagnosis not present

## 2022-02-21 LAB — RAD ONC ARIA SESSION SUMMARY
Course Elapsed Days: 36
Plan Fractions Treated to Date: 2
Plan Prescribed Dose Per Fraction: 1.8 Gy
Plan Total Fractions Prescribed: 3
Plan Total Prescribed Dose: 5.4 Gy
Reference Point Dosage Given to Date: 48.6 Gy
Reference Point Session Dosage Given: 1.8 Gy
Session Number: 27

## 2022-02-22 ENCOUNTER — Encounter: Payer: Self-pay | Admitting: Radiation Oncology

## 2022-02-22 ENCOUNTER — Other Ambulatory Visit: Payer: Self-pay

## 2022-02-22 ENCOUNTER — Ambulatory Visit
Admission: RE | Admit: 2022-02-22 | Discharge: 2022-02-22 | Disposition: A | Discharge status: Home / Self Care | Payer: 59 | Source: Ambulatory Visit | Attending: Radiation Oncology | Admitting: Radiation Oncology

## 2022-02-22 ENCOUNTER — Telehealth: Payer: Self-pay | Admitting: Hematology

## 2022-02-22 DIAGNOSIS — Z510 Encounter for antineoplastic radiation therapy: Secondary | ICD-10-CM | POA: Diagnosis not present

## 2022-02-22 LAB — RAD ONC ARIA SESSION SUMMARY
Course Elapsed Days: 37
Plan Fractions Treated to Date: 3
Plan Prescribed Dose Per Fraction: 1.8 Gy
Plan Total Fractions Prescribed: 3
Plan Total Prescribed Dose: 5.4 Gy
Reference Point Dosage Given to Date: 50.4 Gy
Reference Point Session Dosage Given: 1.8 Gy
Session Number: 28

## 2022-02-22 NOTE — Telephone Encounter (Signed)
I called Ethan Martin back, and answered his questions about diet, exercise, alcohol and precaution for sexual activity.  I encouraged him to minimize alcohol intake, and not restart until 1 or 2 weeks later.  I encouraged him to consider pelvic physical therapy, he declined.  He finished chemo and radiation today.  He is scheduled to see me back in mid June. ? ?Ethan Martin  ?02/22/2022  ?

## 2022-03-14 ENCOUNTER — Encounter: Payer: Self-pay | Admitting: Hematology

## 2022-03-14 NOTE — Progress Notes (Signed)
                                                                                                                                                             Patient Name: Ethan Martin MRN: [id number] DOB: 07-01-1966 Referring Physician: Truitt Merle (Profile Not Attached) Date of Service: 02/22/2022 [facility], [place]                                                        End Of Treatment Note  Diagnoses: C18.7-Malignant neoplasm of sigmoid colon  Cancer Staging:  Stage IIIB, pT3N1bM0 grade 2 adenocarcinoma of the sigmoid colon with a positive radial margin.  Intent: Curative  Radiation Treatment Dates: 01/16/2022 through 02/22/2022 Site Technique Total Dose (Gy) Dose per Fx (Gy) Completed Fx Beam Energies  Colon: Abd 3D 45/45 1.8 25/25 6X, 15X  Colon: Abd_Bst 3D 5.4/5.4 1.8 3/3 15X   Narrative: The patient tolerated radiation therapy relatively well. He did notice some nausea during radiotherapy but no other complaints were noted at the conclusion of therapy.  Plan: The patient will receive a call in about one month from the radiation oncology department. He will continue follow up with Dr. Burr Medico as well. He will also see [name] in surveillance.   ________________________________________________    Carola Rhine, [facility]

## 2022-04-03 ENCOUNTER — Ambulatory Visit (HOSPITAL_COMMUNITY)
Admission: RE | Admit: 2022-04-03 | Discharge: 2022-04-03 | Disposition: A | Discharge status: Home / Self Care | Payer: 59 | Source: Ambulatory Visit | Attending: Hematology | Admitting: Hematology

## 2022-04-03 ENCOUNTER — Other Ambulatory Visit: Payer: Self-pay

## 2022-04-03 ENCOUNTER — Telehealth: Payer: Self-pay

## 2022-04-03 ENCOUNTER — Encounter (HOSPITAL_COMMUNITY): Payer: Self-pay

## 2022-04-03 ENCOUNTER — Inpatient Hospital Stay: Payer: 59 | Attending: Nurse Practitioner

## 2022-04-03 ENCOUNTER — Ambulatory Visit
Admission: RE | Admit: 2022-04-03 | Discharge: 2022-04-03 | Disposition: A | Discharge status: Home / Self Care | Payer: 59 | Source: Ambulatory Visit | Attending: Hematology | Admitting: Hematology

## 2022-04-03 DIAGNOSIS — C187 Malignant neoplasm of sigmoid colon: Secondary | ICD-10-CM | POA: Insufficient documentation

## 2022-04-03 LAB — CBC WITH DIFFERENTIAL (CANCER CENTER ONLY)
Abs Immature Granulocytes: 0.01 10*3/uL (ref 0.00–0.07)
Basophils Absolute: 0 10*3/uL (ref 0.0–0.1)
Basophils Relative: 1 %
Eosinophils Absolute: 0.3 10*3/uL (ref 0.0–0.5)
Eosinophils Relative: 6 %
HCT: 35.6 % — ABNORMAL LOW (ref 39.0–52.0)
Hemoglobin: 11.9 g/dL — ABNORMAL LOW (ref 13.0–17.0)
Immature Granulocytes: 0 %
Lymphocytes Relative: 26 %
Lymphs Abs: 1.1 10*3/uL (ref 0.7–4.0)
MCH: 29.6 pg (ref 26.0–34.0)
MCHC: 33.4 g/dL (ref 30.0–36.0)
MCV: 88.6 fL (ref 80.0–100.0)
Monocytes Absolute: 0.6 10*3/uL (ref 0.1–1.0)
Monocytes Relative: 14 %
Neutro Abs: 2.2 10*3/uL (ref 1.7–7.7)
Neutrophils Relative %: 53 %
Platelet Count: 275 10*3/uL (ref 150–400)
RBC: 4.02 MIL/uL — ABNORMAL LOW (ref 4.22–5.81)
RDW: 17.1 % — ABNORMAL HIGH (ref 11.5–15.5)
WBC Count: 4.1 10*3/uL (ref 4.0–10.5)
nRBC: 0 % (ref 0.0–0.2)

## 2022-04-03 LAB — CMP (CANCER CENTER ONLY)
ALT: 12 U/L (ref 0–44)
AST: 28 U/L (ref 15–41)
Albumin: 4.1 g/dL (ref 3.5–5.0)
Alkaline Phosphatase: 91 U/L (ref 38–126)
Anion gap: 6 (ref 5–15)
BUN: 10 mg/dL (ref 6–20)
CO2: 28 mmol/L (ref 22–32)
Calcium: 9.4 mg/dL (ref 8.9–10.3)
Chloride: 106 mmol/L (ref 98–111)
Creatinine: 0.94 mg/dL (ref 0.61–1.24)
GFR, Estimated: >60 mL/min (ref >60)
Glucose, Bld: 94 mg/dL (ref 70–99)
Potassium: 3.7 mmol/L (ref 3.5–5.1)
Sodium: 140 mmol/L (ref 135–145)
Total Bilirubin: 0.5 mg/dL (ref 0.3–1.2)
Total Protein: 7.8 g/dL (ref 6.5–8.1)

## 2022-04-03 LAB — FERRITIN: Ferritin: 5 ng/mL — ABNORMAL LOW (ref 24–336)

## 2022-04-03 LAB — VITAMIN B12: Vitamin B-12: 135 pg/mL — ABNORMAL LOW (ref 180–914)

## 2022-04-03 MED ORDER — IOHEXOL 300 MG/ML  SOLN
100.0000 mL | Freq: Once | INTRAMUSCULAR | Status: AC | PRN
  Administered 2022-04-03: 100 mL via INTRAVENOUS

## 2022-04-03 NOTE — Telephone Encounter (Signed)
This nurse reached out to patient related to message concerning how to drink the oral contrast for the CT scan.  Patient stated that he understands how to drink the contrast now.  It was explained at registration due to patient arriving for CT appointment early.  Verified that there were no further questions or concerns at this time.  Patient knows to call clinic if there are any further question or concerns .

## 2022-04-03 NOTE — Progress Notes (Signed)
  Radiation Oncology         [phone] ________________________________  Name: Ethan Martin MRN: [id number]  Date of Service: 04/03/2022  DOB: 03-03-1966  Post Treatment Telephone Note  Diagnosis:   Stage IIIB, pT3N1bM0 grade 2 adenocarcinoma of the sigmoid colon with a positive radial margin.  Intent: Curative  Radiation Treatment Dates: 01/16/2022 through 02/22/2022 Site Technique Total Dose (Gy) Dose per Fx (Gy) Completed Fx Beam Energies  Colon: Abd 3D 45/45 1.8 25/25 6X, 15X  Colon: Abd_Bst 3D 5.4/5.4 1.8 3/3 15X   Narrative: The patient tolerated radiation therapy relatively well. He did notice some nausea during radiotherapy but no other complaints were noted at the conclusion of therapy. 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   Impression/Plan: 1. 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 The patient has been doing well since completion of radiotherapy. We discussed that we would be happy to continue to follow him as needed, but he will also continue to follow up with Dr. Burr Medico and [name].     Carola Rhine, PAC

## 2022-04-06 ENCOUNTER — Encounter: Payer: Self-pay | Admitting: Hematology

## 2022-04-06 ENCOUNTER — Inpatient Hospital Stay (HOSPITAL_BASED_OUTPATIENT_CLINIC_OR_DEPARTMENT_OTHER): Payer: 59 | Admitting: Hematology

## 2022-04-06 DIAGNOSIS — C187 Malignant neoplasm of sigmoid colon: Secondary | ICD-10-CM

## 2022-04-06 NOTE — Progress Notes (Addendum)
[place]   Telephone:(336) [phone] Fax:([phone]) [phone]   Clinic Follow up Note   Patient Care Team: Patient, No Pcp Per as PCP - General (General Practice) Lavena Bullion, DO as Consulting Physician (Gastroenterology) Ileana Roup, MD as Consulting Physician (General Surgery) Truitt Merle, MD as Consulting Physician (Hematology) Alla Feeling, NP as Nurse Practitioner (Nurse Practitioner)  Date of Service:  04/06/2022  I connected with Ethan Martin on 04/06/2022 at  1:00 PM EDT by telephone visit and verified that I am speaking with the correct person using two identifiers.  I discussed the limitations, risks, security and privacy concerns of performing an evaluation and management service by telephone and the availability of in person appointments. I also discussed with the patient that there may be a patient responsible charge related to this service. The patient expressed understanding and agreed to proceed.   Other persons participating in the visit and their role in the encounter:  none  Patient's location:  his car Provider's location:  my office  CHIEF COMPLAINT: f/u of colon cancer  CURRENT THERAPY:  Surveillance  ASSESSMENT & PLAN:  Ethan Martin is a 56 y.o. male with   1. 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 Baseline CEA normal, staging work-up was negative for distant metastasis -He underwent surgical resection 08/31/2021 by [name], path showed: 4 cm invasive moderately differentiated adenocarcinoma invading into subserosa; metastatic carcinoma in 2/22 lymph nodes with lymphovascular invasion; positive radial margin.  There was no perineural invasion or perforation.   -he received 4 cycles CAPOX, with Xeloda  1500 mg in the morning, 2000 mg in the evening for 14 days on/7 days off, on 09/30/21 - 12/02/21. -he had surveillance colonoscopy on 01/04/22 under Dr. [name]. Pathology from transverse colon showed tubular adenoma. Rectal biopsies were benign. -he received concurrent chemoRT with Xeloda 01/16/22 - 02/22/22. He tolerated well overall. -post-treatment CT AP 04/03/22 showed NED. I reviewed the results with him today. -labs from that day reviewed, CBC overall improved off treatment. His ferritin and vit B12 are low.   2. Iron and B12 deficiency anemia -Secondary to #1 -Work-up 07/19/21 showed B12 125, ferritin 6, hemoglobin 10 -I previously recommend IV iron and B12 injections, he declined. -hgb stable 11.9, but ferritin 5 and vit B12 135 on 04/03/22. I again recommended B12 injections. He would prefer to try restarting oral B12 first.   3. Family history -He has 1 healthy daughter age 89, we discussed beginning screening in her early 94s due to patient's young age at diagnosis -Given no other strong family history he likely does not need genetic testing   4. Health maintenance -I encouraged a healthy active lifestyle with normal diet, hydration, avoiding smoking, limiting alcohol. He currently drinks 2 alcoholic beverages a week. I reviewed the detriments of alcohol on the body. -He declines flu or COVID vaccines, I previously reviewed infection precautions especially on chemo     PLAN: -restart oral vit B12 153mg daily -start OTC oron pill -lab and f/u in 2 months, to see if he needs B12 injections  -I will send him a MyChart message regarding the above discussion, because patient was driving when I called him.    No problem-specific Assessment & Plan notes found for this encounter.   SUMMARY OF ONCOLOGIC HISTORY: Oncology History  Malignant neoplasm of sigmoid colon ([place])  07/20/2021 Procedure   Diagnostic EGD by Dr. [name] - Normal esophagus. - Z-line regular, 40 cm from the  incisors. - Normal stomach. Biopsied. - Normal examined duodenum. Biopsied. Colonoscopy - Hemorrhoids found on perianal exam. - Likely malignant completely obstructing tumor in the sigmoid colon. Biopsied. Tattooed. - Non-bleeding internal hemorrhoids.   07/20/2021 Initial Biopsy   Diagnosis 1. Duodenum, Biopsy - BENIGN DUODENAL MUCOSA - NO ACUTE INFLAMMATION, VILLOUS BLUNTING OR INCREASED INTRAEPITHELIAL LYMPHOCYTES 2. Stomach, biopsy - MILD CHRONIC GASTRITIS WITH REACTIVE CHANGES - NO H. PYLORI OR INTESTINAL METAPLASIA IDENTIFIED - SEE COMMENT 3. Sigmoid Colon Biopsy - ADENOCARCINOMA - SEE COMMENT Microscopic Comment 2. H. pylori immunohistochemistry is NEGATIVE for microorganisms.   07/20/2021 Tumor Marker   CEA normal 3.2   07/28/2021 Imaging   Staging CT CAP IMPRESSION: 1. Circumferential wall thickening of the distal sigmoid colon, in keeping with primary colon malignancy identified by colonoscopy. 2. There is some nodularity in the adjacent mesocolon about the inferior mesenteric artery, as well as prominent subcentimeter lymph nodes, worrisome for early nodal or soft tissue metastatic disease. 3. No evidence of distant metastatic disease in the chest, abdomen, or pelvis.   08/31/2021 Cancer Staging   Staging form: Colon and Rectum, AJCC 8th Edition - Pathologic stage from 08/31/2021: Stage IIIB (pT3, pN1b, cM0) - Signed by Alla Feeling, NP on 09/19/2021 Stage prefix: Initial diagnosis Histologic grading system: 4 grade system Histologic grade (G): G2 Laterality: Left Lymph-vascular invasion (LVI): LVI present/identified, NOS Carcinoembryonic antigen (CEA) (ng/mL): 3.2 Perineural invasion (PNI): Absent   08/31/2021 Definitive Surgery   PREOP DIAGNOSIS: sigmoid colon cancer POSTOP DIAGNOSIS: Rectosigmoid colon cancer PROCEDURE:  Robotic assisted low anterior resection with double stapled colorectal anastomosis Intraoperative assessment of perfusion (ICG) Flexible  sigmoidoscopy Bilateral transversus abdominus plane blocks SURGEON: Sharon Mt. Dema Severin, MD ASSISTANT: Leighton Ruff, MD   06/24/9573 Pathology Results   FINAL MICROSCOPIC DIAGNOSIS: A. COLON, RECTOSIGMOID, RESECTION: - Invasive moderately differentiated adenocarcinoma, 4 cm, involving distal sigmoid colon - Carcinoma invades into subserosa - Inked radial margin is positive for carcinoma - Metastatic carcinoma to two of twenty-two lymph nodes (2/22) - See oncology table B. FINAL DISTAL MARGIN: - Colonic donut, negative for carcinoma pT3pN1b, stage IIIb MMR IHC normal MSI-stable   09/19/2021 Initial Diagnosis   Malignant neoplasm of sigmoid colon ([place])   09/30/2021 -  Chemotherapy   Patient is on Treatment Plan : COLORECTAL Xelox (Capeox) q21d     01/04/2022 Procedure   Colonoscopy, Dr. [name]  Impression: - One 5 mm polyp in the proximal transverse colon, removed with a cold snare. Resected and retrieved. - Patent end-to-end low-anterior anastomosis, located 20 cm from the anal verge, characterized by healthy appearing mucosa. Biopsied. - One 3 mm polyp in the rectosigmoid, removed with a cold snare. Resected and retrieved. - Non-bleeding internal hemorrhoids. - Stool in the entire examined colon.   01/04/2022 Pathology Results   Diagnosis 1. Surgical [P], colon, transverse, polyp (1) - TUBULAR ADENOMA WITHOUT HIGH-GRADE DYSPLASIA. 2. Surgical [P], colon, rectum - COLONIC MUCOSA WITH CHANGES CONSISTENT WITH MUCOSAL PROLAPSE. - NO ADENOMATOUS CHANGE OR MALIGNANCY. 3. Surgical [P], colon, rectum, polyp (1) - HYPERPLASTIC POLYP (S).      INTERVAL HISTORY:  Ethan Martin was contacted for a follow up of colon cancer. He was last seen by me on 02/14/22. He reports he is doing well overall. He reports his main residual side effect is numbness/tingling in his fingers and toes.  All other systems were reviewed with the patient and are negative.  MEDICAL HISTORY:  Past  Medical History:  Diagnosis Date   Anemia 10/2021   IDA r/o Chemo, colon ca dx, Vitamin B12 deficiency   Arthritis    Cancer ([place]) 07/2021   Colon   Cataract    GERD (gastroesophageal reflux disease)     SURGICAL HISTORY: Past Surgical History:  Procedure Laterality Date   COLON RESECTION  08/31/2021   Colon ca, Dr Barry Brunner   COLONOSCOPY  01/04/2022   COLONOSCOPY WITH PROPOFOL  06/2021   FLEXIBLE SIGMOIDOSCOPY N/A 08/31/2021   Procedure: FLEXIBLE SIGMOIDOSCOPY;  Surgeon: Ileana Roup, MD;  Location: [facility];  Service: General;  Laterality: N/A;   [place]    I have reviewed the social history and family history with the patient and they are unchanged from previous note.  ALLERGIES:  has No Known Allergies.  MEDICATIONS:  Current Outpatient Medications  Medication Sig Dispense Refill   capecitabine (XELODA) 500 MG tablet Take 3 tabs in morning and 4 tabs in evening, every 10-12 hours, on days with radiation, Monday through Friday only. 100 tablet 1   ferrous sulfate 325 (65 FE) MG EC tablet TAKE 1 TABLET (325 MG TOTAL) BY MOUTH 2 (TWO) TIMES DAILY. TAKE WITH VITAMIN C OR ORANGE JUICE PLEASE. TAKE IRON SUPPLEMENT 2 HOURS BEFORE OR 4 HOURS AFTER PPI OR OTHER ANTACIDS 120 tablet 1   ondansetron (ZOFRAN) 8 MG tablet Take 1 tablet (8 mg total) by mouth 2 (two) times daily as needed for refractory nausea / vomiting. Start on day 3 after chemotherapy. 30 tablet 1   prochlorperazine (COMPAZINE) 10 MG tablet Take 1 tablet (10 mg total) by mouth every 6 (six) hours as needed (Nausea or vomiting). 30 tablet 2   scopolamine (TRANSDERM-SCOP) 1 MG/3DAYS Place 1 patch (1.5 mg total) onto the skin every 3 (three) days. 4 patch 0   vitamin B-12 (CYANOCOBALAMIN) 1000 MCG tablet Take 1 tablet (1,000 mcg total) by mouth daily. 30 tablet 6   No current facility-administered medications for this visit.    PHYSICAL EXAMINATION: ECOG PERFORMANCE  STATUS: 0 - Asymptomatic  There were no vitals filed for this visit. Wt Readings from Last 3 Encounters:  02/14/22 196 lb 6.4 oz (89.1 kg)  02/01/22 196 lb (88.9 kg)  01/04/22 186 lb 9.6 oz (84.6 kg)     No vitals taken today, Exam not performed today  LABORATORY DATA:  I have reviewed the data as listed    Latest Ref Rng & Units 04/03/2022    2:31 PM 02/14/2022   12:42 PM 01/30/2022   11:24 AM  CBC  WBC 4.0 - 10.5 K/uL 4.1  3.1  4.8   Hemoglobin 13.0 - 17.0 g/dL 11.9  11.2  11.8   Hematocrit 39.0 - 52.0 % 35.6  35.5  37.1   Platelets 150 - 400 K/uL 275  139  230         Latest Ref Rng & Units 04/03/2022    2:31 PM 02/14/2022   12:42 PM 01/30/2022   11:24 AM  CMP  Glucose 70 - 99 mg/dL 94  87  109   BUN 6 - 20 mg/dL _0 Creatinine 0.61 - 1.24 mg/dL 0.94  0.93  0.82   Sodium 135 - 145 mmol/L 140  142  141   Potassium 3.5 - 5.1 mmol/L  3.7  3.5  3.7   Chloride 98 - 111 mmol/L 106  106  106   CO2 22 - 32 mmol/L _0 Calcium 8.9 - 10.3 mg/dL 9.4  9.2  9.0   Total Protein 6.5 - 8.1 g/dL 7.8  7.4  7.5   Total Bilirubin 0.3 - 1.2 mg/dL 0.5  0.6  0.4   Alkaline Phos 38 - 126 U/L 91  93  95   AST 15 - 41 U/L _1 ALT 0 - 44 U/L _2 RADIOGRAPHIC STUDIES: I have personally reviewed the radiological images as listed and agreed with the findings in the report. No results found.    No orders of the defined types were placed in this encounter.  All questions were answered. The patient knows to call the clinic with any problems, questions or concerns. No barriers to learning was detected. The total time spent in the appointment was 21 minutes.     Truitt Merle, MD 04/06/2022   I, Wilburn Mylar, am acting as scribe for Truitt Merle, MD.   I have reviewed the above documentation for accuracy and completeness, and I agree with the above.

## 2022-04-14 ENCOUNTER — Telehealth: Payer: Self-pay | Admitting: Hematology

## 2022-05-15 ENCOUNTER — Other Ambulatory Visit: Payer: Self-pay

## 2022-05-18 ENCOUNTER — Other Ambulatory Visit: Payer: Self-pay

## 2022-05-23 ENCOUNTER — Other Ambulatory Visit: Payer: Self-pay

## 2022-05-24 ENCOUNTER — Other Ambulatory Visit: Payer: Self-pay

## 2022-06-05 ENCOUNTER — Other Ambulatory Visit: Payer: 59

## 2022-06-05 ENCOUNTER — Ambulatory Visit: Payer: 59 | Admitting: Hematology

## 2022-06-06 ENCOUNTER — Other Ambulatory Visit: Payer: Self-pay | Admitting: Hematology

## 2022-06-06 DIAGNOSIS — E538 Deficiency of other specified B group vitamins: Secondary | ICD-10-CM

## 2022-06-06 DIAGNOSIS — D500 Iron deficiency anemia secondary to blood loss (chronic): Secondary | ICD-10-CM

## 2022-06-06 DIAGNOSIS — C187 Malignant neoplasm of sigmoid colon: Secondary | ICD-10-CM

## 2022-06-07 ENCOUNTER — Inpatient Hospital Stay: Payer: 59 | Attending: Nurse Practitioner

## 2022-06-07 ENCOUNTER — Inpatient Hospital Stay (HOSPITAL_BASED_OUTPATIENT_CLINIC_OR_DEPARTMENT_OTHER): Payer: 59 | Admitting: Hematology

## 2022-06-07 ENCOUNTER — Other Ambulatory Visit: Payer: Self-pay

## 2022-06-07 VITALS — BP 139/79 | HR 57 | Temp 98.0°F | Wt 197.9 lb

## 2022-06-07 DIAGNOSIS — D509 Iron deficiency anemia, unspecified: Secondary | ICD-10-CM | POA: Diagnosis not present

## 2022-06-07 DIAGNOSIS — C187 Malignant neoplasm of sigmoid colon: Secondary | ICD-10-CM | POA: Diagnosis present

## 2022-06-07 DIAGNOSIS — D500 Iron deficiency anemia secondary to blood loss (chronic): Secondary | ICD-10-CM

## 2022-06-07 DIAGNOSIS — E538 Deficiency of other specified B group vitamins: Secondary | ICD-10-CM

## 2022-06-07 LAB — CBC WITH DIFFERENTIAL/PLATELET
Abs Immature Granulocytes: 0.01 10*3/uL (ref 0.00–0.07)
Basophils Absolute: 0 10*3/uL (ref 0.0–0.1)
Basophils Relative: 1 %
Eosinophils Absolute: 0.1 10*3/uL (ref 0.0–0.5)
Eosinophils Relative: 3 %
HCT: 38.5 % — ABNORMAL LOW (ref 39.0–52.0)
Hemoglobin: 12.7 g/dL — ABNORMAL LOW (ref 13.0–17.0)
Immature Granulocytes: 0 %
Lymphocytes Relative: 28 %
Lymphs Abs: 1 10*3/uL (ref 0.7–4.0)
MCH: 28.8 pg (ref 26.0–34.0)
MCHC: 33 g/dL (ref 30.0–36.0)
MCV: 87.3 fL (ref 80.0–100.0)
Monocytes Absolute: 0.6 10*3/uL (ref 0.1–1.0)
Monocytes Relative: 16 %
Neutro Abs: 1.8 10*3/uL (ref 1.7–7.7)
Neutrophils Relative %: 52 %
Platelets: 252 10*3/uL (ref 150–400)
RBC: 4.41 MIL/uL (ref 4.22–5.81)
RDW: 14.1 % (ref 11.5–15.5)
WBC: 3.5 10*3/uL — ABNORMAL LOW (ref 4.0–10.5)
nRBC: 0 % (ref 0.0–0.2)

## 2022-06-07 LAB — COMPREHENSIVE METABOLIC PANEL
ALT: 9 U/L (ref 0–44)
AST: 16 U/L (ref 15–41)
Albumin: 4.2 g/dL (ref 3.5–5.0)
Alkaline Phosphatase: 100 U/L (ref 38–126)
Anion gap: 3 — ABNORMAL LOW (ref 5–15)
BUN: 9 mg/dL (ref 6–20)
CO2: 29 mmol/L (ref 22–32)
Calcium: 9.5 mg/dL (ref 8.9–10.3)
Chloride: 106 mmol/L (ref 98–111)
Creatinine, Ser: 0.87 mg/dL (ref 0.61–1.24)
GFR, Estimated: >60 mL/min (ref >60)
Glucose, Bld: 94 mg/dL (ref 70–99)
Potassium: 3.9 mmol/L (ref 3.5–5.1)
Sodium: 138 mmol/L (ref 135–145)
Total Bilirubin: 0.4 mg/dL (ref 0.3–1.2)
Total Protein: 8.2 g/dL — ABNORMAL HIGH (ref 6.5–8.1)

## 2022-06-07 LAB — VITAMIN B12: Vitamin B-12: 198 pg/mL (ref 180–914)

## 2022-06-07 LAB — FERRITIN: Ferritin: 5 ng/mL — ABNORMAL LOW (ref 24–336)

## 2022-06-07 LAB — CEA (IN HOUSE-CHCC): CEA (CHCC-In House): <1 ng/mL (ref 0.00–5.00)

## 2022-06-07 NOTE — Progress Notes (Signed)
[place]   Telephone:(336) [phone] Fax:(336) [phone]   Clinic Follow up Note   Patient Care Team: Patient, No Pcp Per as PCP - General (General Practice) Lavena Bullion, DO as Consulting Physician (Gastroenterology) Ileana Roup, MD as Consulting Physician (General Surgery) Truitt Merle, MD as Consulting Physician (Hematology) Alla Feeling, NP as Nurse Practitioner (Nurse Practitioner)  Date of Service:  06/07/2022  CHIEF COMPLAINT: f/u of colon cancer  CURRENT THERAPY:  Surveillance  ASSESSMENT & PLAN:  Ethan Martin is a 56 y.o. male with   1. 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 Baseline CEA normal, staging work-up was negative for distant metastasis -s/p surgical resection 08/31/21 by [name], path showed: 4 cm invasive moderately differentiated adenocarcinoma invading into subserosa; metastatic carcinoma in 2/22 lymph nodes with lymphovascular invasion; positive radial margin. No perineural invasion or perforation.   -he received 4 cycles CAPOX, with Xeloda 1500 mg in the morning, 2000 mg in the evening for 14 days on/7 days off, on 09/30/21 - 12/02/21. -surveillance colonoscopy on 01/04/22 under Dr. [name]. Pathology from transverse colon showed tubular adenoma. Rectal biopsies were benign. Recall 12/2022. -he received concurrent chemoRT with Xeloda 01/16/22 - 02/22/22. He tolerated well overall. -post-treatment CT AP 04/03/22 showed NED. Plan for repeat 09/2022. -labs reviewed, CBC overall improved, but WBC low at 3.5. CMP WNL.   2. Iron and B12 deficiency anemia -Secondary to #1 -Work-up 07/19/21 showed B12 125, ferritin 6, hemoglobin 10 -I previously recommend IV iron, he declined. -he did not tolerate oral  B12, but he admits he took 1091m BID. We again discussed B12 injections to help improve his neuropathy in his feet. After lengthy discussion, he agrees to try B12 injections if level still low.   3. Family history -He has 1 healthy daughter age 58 we discussed beginning screening in her early 470sdue to patient's young age at diagnosis -Given no other strong family history he likely does not need genetic testing   4. Health maintenance -I encouraged a healthy active lifestyle with normal diet, hydration, avoiding smoking, limiting alcohol. He currently drinks 2 alcoholic beverages a week. I reviewed the detriments of alcohol on the body. -He declines flu or COVID vaccines, I previously reviewed infection precautions      PLAN: -will start B12 injections monthly  -f/u in 4 months, with lab and CT several days before   No problem-specific Assessment & Plan notes found for this encounter.   SUMMARY OF ONCOLOGIC HISTORY: Oncology History  [place]  07/20/2021 Procedure   Diagnostic EGD by Dr. CBryan Lemma- Normal esophagus. - Z-line regular, 40 cm from the incisors. - Normal stomach. Biopsied. - Normal examined duodenum. Biopsied. Colonoscopy - Hemorrhoids found on perianal exam. - Likely malignant completely obstructing tumor in the sigmoid colon. Biopsied. Tattooed. - Non-bleeding internal hemorrhoids.   07/20/2021 Initial Biopsy   Diagnosis 1. Duodenum, Biopsy - BENIGN DUODENAL MUCOSA - NO ACUTE INFLAMMATION, VILLOUS BLUNTING OR INCREASED INTRAEPITHELIAL LYMPHOCYTES 2. Stomach, biopsy - MILD CHRONIC GASTRITIS WITH REACTIVE CHANGES - NO H. PYLORI OR INTESTINAL METAPLASIA IDENTIFIED - SEE COMMENT 3. Sigmoid Colon Biopsy - ADENOCARCINOMA - SEE COMMENT Microscopic Comment 2. H. pylori immunohistochemistry is NEGATIVE for microorganisms.   07/20/2021 Tumor Marker   CEA normal 3.2   07/28/2021 Imaging  Staging CT CAP IMPRESSION: 1.  Circumferential wall thickening of the distal sigmoid colon, in keeping with primary colon malignancy identified by colonoscopy. 2. There is some nodularity in the adjacent mesocolon about the inferior mesenteric artery, as well as prominent subcentimeter lymph nodes, worrisome for early nodal or soft tissue metastatic disease. 3. No evidence of distant metastatic disease in the chest, abdomen, or pelvis.   08/31/2021 Cancer Staging   Staging form: Colon and Rectum, AJCC 8th Edition - Pathologic stage from 08/31/2021: Stage IIIB (pT3, pN1b, cM0) - Signed by Alla Feeling, NP on 09/19/2021 Stage prefix: Initial diagnosis Histologic grading system: 4 grade system Histologic grade (G): G2 Laterality: Left Lymph-vascular invasion (LVI): LVI present/identified, NOS Carcinoembryonic antigen (CEA) (ng/mL): 3.2 Perineural invasion (PNI): Absent   08/31/2021 Definitive Surgery   PREOP DIAGNOSIS: sigmoid colon cancer POSTOP DIAGNOSIS: Rectosigmoid colon cancer PROCEDURE:  Robotic assisted low anterior resection with double stapled colorectal anastomosis Intraoperative assessment of perfusion (ICG) Flexible sigmoidoscopy Bilateral transversus abdominus plane blocks SURGEON: Sharon Mt. Dema Severin, MD ASSISTANT: Leighton Ruff, MD   82/06/9370 Pathology Results   FINAL MICROSCOPIC DIAGNOSIS: A. COLON, RECTOSIGMOID, RESECTION: - Invasive moderately differentiated adenocarcinoma, 4 cm, involving distal sigmoid colon - Carcinoma invades into subserosa - Inked radial margin is positive for carcinoma - Metastatic carcinoma to two of twenty-two lymph nodes (2/22) - See oncology table B. FINAL DISTAL MARGIN: - Colonic donut, negative for carcinoma pT3pN1b, stage IIIb MMR IHC normal MSI-stable   09/19/2021 Initial Diagnosis   Malignant neoplasm of sigmoid colon ([place])   09/30/2021 -  Chemotherapy   Patient is on Treatment Plan : COLORECTAL Xelox (Capeox) q21d     01/04/2022 Procedure    Colonoscopy, Dr. [name]  Impression: - One 5 mm polyp in the proximal transverse colon, removed with a cold snare. Resected and retrieved. - Patent end-to-end low-anterior anastomosis, located 20 cm from the anal verge, characterized by healthy appearing mucosa. Biopsied. - One 3 mm polyp in the rectosigmoid, removed with a cold snare. Resected and retrieved. - Non-bleeding internal hemorrhoids. - Stool in the entire examined colon.   01/04/2022 Pathology Results   Diagnosis 1. Surgical [P], colon, transverse, polyp (1) - TUBULAR ADENOMA WITHOUT HIGH-GRADE DYSPLASIA. 2. Surgical [P], colon, rectum - COLONIC MUCOSA WITH CHANGES CONSISTENT WITH MUCOSAL PROLAPSE. - NO ADENOMATOUS CHANGE OR MALIGNANCY. 3. Surgical [P], colon, rectum, polyp (1) - HYPERPLASTIC POLYP (S).      INTERVAL HISTORY:  Ethan Martin is here for a follow up of colon cancer. He was last seen by me on 04/06/22. He presents to the clinic alone. He reports he is doing well overall. He reports diarrhea 2-3 times a day for about the last month. He explains it's not every day but more often than before. He reports continued numbness in his feet. He notes he tried oral B12, but it made him feel sick so he stopped. He tells me he was taking 1074m twice a day.   All other systems were reviewed with the patient and are negative.  MEDICAL HISTORY:  Past Medical History:  Diagnosis Date   Anemia 10/2021   IDA r/o Chemo, colon ca dx, Vitamin B12 deficiency   Arthritis    [place]   Colon   Cataract    GERD (gastroesophageal reflux disease)     SURGICAL HISTORY: Past Surgical History:  Procedure Laterality Date   COLON RESECTION  08/31/2021   Colon ca, Dr WBarry Brunner  COLONOSCOPY  01/04/2022   COLONOSCOPY WITH PROPOFOL  06/2021   FLEXIBLE SIGMOIDOSCOPY N/A 08/31/2021   Procedure: FLEXIBLE SIGMOIDOSCOPY;  Surgeon: Ileana Roup, MD;  Location: [facility];  Service: General;  Laterality: N/A;   NOSE  SURGERY     WISDOM TOOTH EXTRACTION     1988    I have reviewed the social history and family history with the patient and they are unchanged from previous note.  ALLERGIES:  has No Known Allergies.  MEDICATIONS:  Current Outpatient Medications  Medication Sig Dispense Refill   capecitabine (XELODA) 500 MG tablet Take 3 tabs in morning and 4 tabs in evening, every 10-12 hours, on days with radiation, Monday through Friday only. 100 tablet 1   ferrous sulfate 325 (65 FE) MG EC tablet TAKE 1 TABLET (325 MG TOTAL) BY MOUTH 2 (TWO) TIMES DAILY. TAKE WITH VITAMIN C OR ORANGE JUICE PLEASE. TAKE IRON SUPPLEMENT 2 HOURS BEFORE OR 4 HOURS AFTER PPI OR OTHER ANTACIDS 120 tablet 1   ondansetron (ZOFRAN) 8 MG tablet Take 1 tablet (8 mg total) by mouth 2 (two) times daily as needed for refractory nausea / vomiting. Start on day 3 after chemotherapy. 30 tablet 1   prochlorperazine (COMPAZINE) 10 MG tablet Take 1 tablet (10 mg total) by mouth every 6 (six) hours as needed (Nausea or vomiting). 30 tablet 2   scopolamine (TRANSDERM-SCOP) 1 MG/3DAYS Place 1 patch (1.5 mg total) onto the skin every 3 (three) days. 4 patch 0   vitamin B-12 (CYANOCOBALAMIN) 1000 MCG tablet Take 1 tablet (1,000 mcg total) by mouth daily. 30 tablet 6   No current facility-administered medications for this visit.    PHYSICAL EXAMINATION: ECOG PERFORMANCE STATUS: 0 - Asymptomatic  Vitals:   06/07/22 1148  BP: 139/79  Pulse: (!) 57  Temp: 98 F (36.7 C)  SpO2: 99%   Wt Readings from Last 3 Encounters:  06/07/22 197 lb 14.4 oz (89.8 kg)  02/14/22 196 lb 6.4 oz (89.1 kg)  02/01/22 196 lb (88.9 kg)     GENERAL:alert, no distress and comfortable SKIN: skin color normal, no rashes or significant lesions EYES: normal, Conjunctiva are pink and non-injected, sclera clear  NEURO: alert & oriented x 3 with fluent speech  LABORATORY DATA:  I have reviewed the data as listed    Latest Ref Rng & Units 06/07/2022   11:26 AM  04/03/2022    2:31 PM 02/14/2022   12:42 PM  CBC  WBC 4.0 - 10.5 K/uL 3.5  4.1  3.1   Hemoglobin 13.0 - 17.0 g/dL 12.7  11.9  11.2   Hematocrit 39.0 - 52.0 % 38.5  35.6  35.5   Platelets 150 - 400 K/uL 252  275  139         Latest Ref Rng & Units 06/07/2022   11:26 AM 04/03/2022    2:31 PM 02/14/2022   12:42 PM  CMP  Glucose 70 - 99 mg/dL 94  94  87   BUN 6 - 20 mg/dL 9  10  11    Creatinine 0.61 - 1.24 mg/dL 0.87  0.94  0.93   Sodium 135 - 145 mmol/L 138  140  142   Potassium 3.5 - 5.1 mmol/L 3.9  3.7  3.5   Chloride 98 - 111 mmol/L 106  106  106   CO2 22 - 32 mmol/L 29  28  29    Calcium 8.9 - 10.3 mg/dL 9.5  9.4  9.2   Total Protein 6.5 - 8.1 g/dL 8.2  7.8  7.4  Total Bilirubin 0.3 - 1.2 mg/dL 0.4  0.5  0.6   Alkaline Phos 38 - 126 U/L 100  91  93   AST 15 - 41 U/L 16  28  15    ALT 0 - 44 U/L 9  12  9        RADIOGRAPHIC STUDIES: I have personally reviewed the radiological images as listed and agreed with the findings in the report. No results found.    Orders Placed This Encounter  Procedures   CT CHEST ABDOMEN PELVIS W CONTRAST    Standing Status:   Future    Standing Expiration Date:   06/08/2023    Order Specific Question:   Preferred imaging location?    Answer:   [facility]    Order Specific Question:   Release to patient    Answer:   Immediate    Order Specific Question:   Is Oral Contrast requested for this exam?    Answer:   Yes, Per Radiology protocol   All questions were answered. The patient knows to call the clinic with any problems, questions or concerns. No barriers to learning was detected. The total time spent in the appointment was 30 minutes.     Truitt Merle, MD 06/07/2022   I, Wilburn Mylar, am acting as scribe for Truitt Merle, MD.   I have reviewed the above documentation for accuracy and completeness, and I agree with the above.

## 2022-06-12 ENCOUNTER — Telehealth: Payer: Self-pay | Admitting: Hematology

## 2022-06-12 NOTE — Telephone Encounter (Signed)
Scheduled follow-up appointments per 8/16 los. Patient is aware.

## 2022-06-14 ENCOUNTER — Other Ambulatory Visit: Payer: Self-pay

## 2022-09-25 ENCOUNTER — Inpatient Hospital Stay: Payer: 59

## 2022-09-26 NOTE — Progress Notes (Unsigned)
[facility]   [phone] [phone]   Clinic Follow up Note   Patient Care Team: Patient, No Pcp Per as PCP - General (General Practice) Cirigliano, Vito V, DO as Consulting Physician (Gastroenterology) White, Christopher M, MD as Consulting Physician (General Surgery) Feng, Yan, MD as Consulting Physician (Hematology) [name], [name], NP as Nurse Practitioner (Nurse Practitioner)  Date of Service:  09/28/2022  I connected with @PTNAME@ on 09/28/22 at 10:00 AM EST by telephone and verified that I am speaking with the correct person using two identifiers.   I discussed the limitations, risks, security and privacy concerns of performing an evaluation and management service by telephone and the availability of in person appointments. I also discussed with the patient that there may be a patient responsible charge related to this service. The patient expressed understanding and agreed to proceed.   Patient's location:  Home  Provider's location:  Office   CHIEF COMPLAINT: f/u of colon cancer   CURRENT THERAPY:  Surveillance   ASSESSMENT:  Ethan Martin is a 56 y.o. male with   Malignant neoplasm of sigmoid colon (HCC)  pT3pN1bM0 stage IIIb, MMR normal, MSI-stable  -Diagnosed in November 2022, status post surgical resection by Dr. White with positive radial margin. -He received 4 cycles adjuvant chemotherapy CapeOx, completed in 11/2021 -He subsequently received concurrent chemoradiation with Xeloda, completed in May 2023 -He is on cancer surveillance, doing well -CT from 09/27/2022 showed NED   B12 deficiency anemia -he did not tolerate oral B12, but he admits he took 1000mg BID. We again discussed B12 injections to help improve his neuropathy in his feet. After lengthy discussion, he agrees to try B12 injections if level still low.  -repeated B12 level on 09/27/2022 was still low 176 -I again strongly encouraged him to take B12 injection, he is  skeptical about B12 injection, concerns about visual side effects, especially on his sexual function, and declined     PLAN: -lab and scan reviewed, NED -lab and f/u in 4 months with APP  SUMMARY OF ONCOLOGIC HISTORY: Oncology History  Malignant neoplasm of sigmoid colon (HCC)  07/20/2021 Procedure   Diagnostic EGD by Dr. Cirigliano - Normal esophagus. - Z-line regular, 40 cm from the incisors. - Normal stomach. Biopsied. - Normal examined duodenum. Biopsied. Colonoscopy - Hemorrhoids found on perianal exam. - Likely malignant completely obstructing tumor in the sigmoid colon. Biopsied. Tattooed. - Non-bleeding internal hemorrhoids.   07/20/2021 Initial Biopsy   Diagnosis 1. Duodenum, Biopsy - BENIGN DUODENAL MUCOSA - NO ACUTE INFLAMMATION, VILLOUS BLUNTING OR INCREASED INTRAEPITHELIAL LYMPHOCYTES 2. Stomach, biopsy - MILD CHRONIC GASTRITIS WITH REACTIVE CHANGES - NO H. PYLORI OR INTESTINAL METAPLASIA IDENTIFIED - SEE COMMENT 3. Sigmoid Colon Biopsy - ADENOCARCINOMA - SEE COMMENT Microscopic Comment 2. H. pylori immunohistochemistry is NEGATIVE for microorganisms.   07/20/2021 Tumor Marker   CEA normal 3.2   07/28/2021 Imaging   Staging CT CAP IMPRESSION: 1. Circumferential wall thickening of the distal sigmoid colon, in keeping with primary colon malignancy identified by colonoscopy. 2. There is some nodularity in the adjacent mesocolon about the inferior mesenteric artery, as well as prominent subcentimeter lymph nodes, worrisome for early nodal or soft tissue metastatic disease. 3. No evidence of distant metastatic disease in the chest, abdomen, or pelvis.   08/31/2021 Cancer Staging   Staging form: Colon and Rectum, AJCC 8th Edition - Pathologic stage from 08/31/2021: Stage IIIB (pT3, pN1b, cM0) - Signed by [name], [name], NP on 09/19/2021 Stage prefix: Initial   diagnosis Histologic grading system: 4 grade system Histologic grade (G): G2 Laterality:  Left Lymph-vascular invasion (LVI): LVI present/identified, NOS Carcinoembryonic antigen (CEA) (ng/mL): 3.2 Perineural invasion (PNI): Absent   08/31/2021 Definitive Surgery   PREOP DIAGNOSIS: sigmoid colon cancer POSTOP DIAGNOSIS: Rectosigmoid colon cancer PROCEDURE:  Robotic assisted low anterior resection with double stapled colorectal anastomosis Intraoperative assessment of perfusion (ICG) Flexible sigmoidoscopy Bilateral transversus abdominus plane blocks SURGEON: Sharon Mt. Dema Severin, MD ASSISTANT: Leighton Ruff, MD   [id number] Pathology Results   FINAL MICROSCOPIC DIAGNOSIS: A. COLON, RECTOSIGMOID, RESECTION: - Invasive moderately differentiated adenocarcinoma, 4 cm, involving distal sigmoid colon - Carcinoma invades into subserosa - Inked radial margin is positive for carcinoma - Metastatic carcinoma to two of twenty-two lymph nodes (2/22) - See oncology table B. FINAL DISTAL MARGIN: - Colonic donut, negative for carcinoma pT3pN1b, stage IIIb MMR IHC normal MSI-stable   09/19/2021 Initial Diagnosis   Malignant neoplasm of sigmoid colon ([place])   09/30/2021 - 12/02/2021 Chemotherapy   Patient is on Treatment Plan : COLORECTAL Xelox (Capeox) q21d     01/04/2022 Procedure   Colonoscopy, Dr. [name]  Impression: - One 5 mm polyp in the proximal transverse colon, removed with a cold snare. Resected and retrieved. - Patent end-to-end low-anterior anastomosis, located 20 cm from the anal verge, characterized by healthy appearing mucosa. Biopsied. - One 3 mm polyp in the rectosigmoid, removed with a cold snare. Resected and retrieved. - Non-bleeding internal hemorrhoids. - Stool in the entire examined colon.   01/04/2022 Pathology Results   Diagnosis 1. Surgical [P], colon, transverse, polyp (1) - TUBULAR ADENOMA WITHOUT HIGH-GRADE DYSPLASIA. 2. Surgical [P], colon, rectum - COLONIC MUCOSA WITH CHANGES CONSISTENT WITH MUCOSAL PROLAPSE. - NO ADENOMATOUS CHANGE OR  MALIGNANCY. 3. Surgical [P], colon, rectum, polyp (1) - HYPERPLASTIC POLYP (S).    Imaging     09/28/2022 Imaging    IMPRESSION: 1. Post sigmoid resection. No evidence of recurrent or metastatic disease in the chest, abdomen or pelvis. 2. Aortic atherosclerosis.      INTERVAL HISTORY:  Ethan Martin is here for a follow up of colon cancer  He was last seen by me on 06/07/2022 He presents to the clinic. He did not show up for his office visit today, and I did a phone visit with him today.  I verified his identity by address and date of birth.  Well overall, denies any residual problem from chemotherapy.  He denies any pain, or other discomfort.  Appetite and energy level are normal.    All other systems were reviewed with the patient and are negative.  MEDICAL HISTORY:  Past Medical History:  Diagnosis Date   Anemia 10/2021   IDA r/o Chemo, colon ca dx, Vitamin B12 deficiency   Arthritis    Cancer ([place]) 07/2021   Colon   Cataract    GERD (gastroesophageal reflux disease)     SURGICAL HISTORY: Past Surgical History:  Procedure Laterality Date   COLON RESECTION  08/31/2021   Colon ca, Dr Barry Brunner   COLONOSCOPY  01/04/2022   COLONOSCOPY WITH PROPOFOL  06/2021   FLEXIBLE SIGMOIDOSCOPY N/A 08/31/2021   Procedure: FLEXIBLE SIGMOIDOSCOPY;  Surgeon: Ileana Roup, MD;  Location: [facility];  Service: General;  Laterality: N/A;   [place]    I have reviewed the social history and family history with the patient and they are unchanged from previous note.  ALLERGIES:  has No Known Allergies.  MEDICATIONS:  Current Outpatient Medications  Medication Sig Dispense Refill   capecitabine (XELODA) 500 MG tablet Take 3 tabs in morning and 4 tabs in evening, every 10-12 hours, on days with radiation, Monday through Friday only. 100 tablet 1   ferrous sulfate 325 (65 FE) MG EC tablet TAKE 1 TABLET (325 MG TOTAL) BY MOUTH 2 (TWO) TIMES  DAILY. TAKE WITH VITAMIN C OR ORANGE JUICE PLEASE. TAKE IRON SUPPLEMENT 2 HOURS BEFORE OR 4 HOURS AFTER PPI OR OTHER ANTACIDS 120 tablet 1   ondansetron (ZOFRAN) 8 MG tablet Take 1 tablet (8 mg total) by mouth 2 (two) times daily as needed for refractory nausea / vomiting. Start on day 3 after chemotherapy. 30 tablet 1   prochlorperazine (COMPAZINE) 10 MG tablet Take 1 tablet (10 mg total) by mouth every 6 (six) hours as needed (Nausea or vomiting). 30 tablet 2   scopolamine (TRANSDERM-SCOP) 1 MG/3DAYS Place 1 patch (1.5 mg total) onto the skin every 3 (three) days. 4 patch 0   vitamin B-12 (CYANOCOBALAMIN) 1000 MCG tablet Take 1 tablet (1,000 mcg total) by mouth daily. 30 tablet 6   No current facility-administered medications for this visit.    PHYSICAL EXAMINATION: ECOG PERFORMANCE STATUS: 0 - Asymptomatic  There were no vitals filed for this visit. Wt Readings from Last 3 Encounters:  06/07/22 197 lb 14.4 oz (89.8 kg)  02/14/22 196 lb 6.4 oz (89.1 kg)  02/01/22 196 lb (88.9 kg)     LABORATORY DATA:  I have reviewed the data as listed    Latest Ref Rng & Units 09/27/2022    3:47 PM 06/07/2022   11:26 AM 04/03/2022    2:31 PM  CBC  WBC 4.0 - 10.5 K/uL 4.6  3.5  4.1   Hemoglobin 13.0 - 17.0 g/dL 14.0  12.7  11.9   Hematocrit 39.0 - 52.0 % 43.6  38.5  35.6   Platelets 150 - 400 K/uL 265  252  275         Latest Ref Rng & Units 09/27/2022    3:47 PM 06/07/2022   11:26 AM 04/03/2022    2:31 PM  CMP  Glucose 70 - 99 mg/dL 68  94  94   BUN 6 - 20 mg/dL 11  9  10   Creatinine 0.61 - 1.24 mg/dL 0.96  0.87  0.94   Sodium 135 - 145 mmol/L 141  138  140   Potassium 3.5 - 5.1 mmol/L 4.1  3.9  3.7   Chloride 98 - 111 mmol/L 104  106  106   CO2 22 - 32 mmol/L 28  29  28   Calcium 8.9 - 10.3 mg/dL 9.4  9.5  9.4   Total Protein 6.5 - 8.1 g/dL 8.2  8.2  7.8   Total Bilirubin 0.3 - 1.2 mg/dL 0.5  0.4  0.5   Alkaline Phos 38 - 126 U/L 88  100  91   AST 15 - 41 U/L 24  16  28   ALT 0 - 44  U/L 12  9  12       RADIOGRAPHIC STUDIES: I have personally reviewed the radiological images as listed and agreed with the findings in the report. CT CHEST ABDOMEN PELVIS W CONTRAST  Result Date: 09/28/2022 CLINICAL DATA:  A 55-year-old male presents for evaluation of colon cancer. * Tracking Code: BO * EXAM: CT CHEST, ABDOMEN, AND PELVIS WITH CONTRAST TECHNIQUE: Multidetector CT imaging of the chest, abdomen and pelvis was   performed following the standard protocol during bolus administration of intravenous contrast. RADIATION DOSE REDUCTION: This exam was performed according to the departmental dose-optimization program which includes automated exposure control, adjustment of the mA and/or kV according to patient size and/or use of iterative reconstruction technique. CONTRAST:  100mL OMNIPAQUE IOHEXOL 300 MG/ML  SOLN COMPARISON:  April 03, 2022 FINDINGS: CT CHEST FINDINGS Cardiovascular: Normal caliber of the thoracic aorta. Mild cardiac enlargement. No pericardial effusion or nodularity. Normal appearance of central pulmonary vasculature. Mediastinum/Nodes: No thoracic inlet, axillary, mediastinal or hilar adenopathy. Esophagus grossly normal. Lungs/Pleura: No effusion. No consolidative changes. No suspicious pulmonary nodules. Airways are patent. Musculoskeletal: See below for full musculoskeletal details. No chest wall mass. CT ABDOMEN PELVIS FINDINGS Hepatobiliary: No focal, suspicious hepatic lesion. No pericholecystic stranding. No biliary duct dilation. Portal vein is patent. Pancreas: Normal, without mass, inflammation or ductal dilatation. Spleen: Normal. Adrenals/Urinary Tract: Adrenal glands are unremarkable. Symmetric renal enhancement. No sign of hydronephrosis. No suspicious renal lesion or perinephric stranding. Urinary bladder is grossly unremarkable. Stomach/Bowel: Post sigmoid resection. No nodularity adjacent to the anastomosis. No signs of bowel obstruction. Normal appendix. Stomach and  small bowel are unremarkable by CT. Vascular/Lymphatic: Aortic atherosclerosis. No sign of aneurysm. Smooth contour of the IVC. There is no gastrohepatic or hepatoduodenal ligament lymphadenopathy. No retroperitoneal or mesenteric lymphadenopathy. No pelvic sidewall lymphadenopathy. Atherosclerotic changes are mild. Reproductive: Unremarkable by CT. Other: No ascites. Musculoskeletal: No acute bone finding. No destructive bone process. Spinal degenerative changes. IMPRESSION: 1. Post sigmoid resection. No evidence of recurrent or metastatic disease in the chest, abdomen or pelvis. 2. Aortic atherosclerosis. Aortic Atherosclerosis (ICD10-I70.0). Electronically Signed   By: Geoffrey  Wile M.D.   On: 09/28/2022 08:46      No orders of the defined types were placed in this encounter.  I discussed the assessment and treatment plan with the patient. The patient was provided an opportunity to ask questions and all were answered. The patient agreed with the plan and demonstrated an understanding of the instructions.   The patient was advised to call back or seek an in-person evaluation if the symptoms worsen or if the condition fails to improve as anticipated.  I provided 22  minutes of non face-to-face telephone visit time during this encounter, and > 50% was spent counseling as documented under my assessment & plan.     Yan Feng, MD 09/28/2022   I, LaChelle McNairy, CMA, am acting as scribe for Yan Feng, MD.   I have reviewed the above documentation for accuracy and completeness, and I agree with the above.     

## 2022-09-27 ENCOUNTER — Inpatient Hospital Stay: Payer: 59 | Attending: Hematology

## 2022-09-27 ENCOUNTER — Ambulatory Visit (HOSPITAL_COMMUNITY)
Admission: RE | Admit: 2022-09-27 | Discharge: 2022-09-27 | Disposition: A | Discharge status: Home / Self Care | Payer: 59 | Source: Ambulatory Visit | Attending: Hematology | Admitting: Hematology

## 2022-09-27 ENCOUNTER — Other Ambulatory Visit (HOSPITAL_COMMUNITY): Payer: 59

## 2022-09-27 DIAGNOSIS — I700 Atherosclerosis of aorta: Secondary | ICD-10-CM | POA: Insufficient documentation

## 2022-09-27 DIAGNOSIS — E538 Deficiency of other specified B group vitamins: Secondary | ICD-10-CM

## 2022-09-27 DIAGNOSIS — D519 Vitamin B12 deficiency anemia, unspecified: Secondary | ICD-10-CM | POA: Insufficient documentation

## 2022-09-27 DIAGNOSIS — D500 Iron deficiency anemia secondary to blood loss (chronic): Secondary | ICD-10-CM

## 2022-09-27 DIAGNOSIS — C187 Malignant neoplasm of sigmoid colon: Secondary | ICD-10-CM | POA: Insufficient documentation

## 2022-09-27 LAB — COMPREHENSIVE METABOLIC PANEL
ALT: 12 U/L (ref 0–44)
AST: 24 U/L (ref 15–41)
Albumin: 4 g/dL (ref 3.5–5.0)
Alkaline Phosphatase: 88 U/L (ref 38–126)
Anion gap: 9 (ref 5–15)
BUN: 11 mg/dL (ref 6–20)
CO2: 28 mmol/L (ref 22–32)
Calcium: 9.4 mg/dL (ref 8.9–10.3)
Chloride: 104 mmol/L (ref 98–111)
Creatinine, Ser: 0.96 mg/dL (ref 0.61–1.24)
GFR, Estimated: >60 mL/min (ref >60)
Glucose, Bld: 68 mg/dL — ABNORMAL LOW (ref 70–99)
Potassium: 4.1 mmol/L (ref 3.5–5.1)
Sodium: 141 mmol/L (ref 135–145)
Total Bilirubin: 0.5 mg/dL (ref 0.3–1.2)
Total Protein: 8.2 g/dL — ABNORMAL HIGH (ref 6.5–8.1)

## 2022-09-27 LAB — CBC WITH DIFFERENTIAL/PLATELET
Abs Immature Granulocytes: 0.01 10*3/uL (ref 0.00–0.07)
Basophils Absolute: 0 10*3/uL (ref 0.0–0.1)
Basophils Relative: 1 %
Eosinophils Absolute: 0.1 10*3/uL (ref 0.0–0.5)
Eosinophils Relative: 2 %
HCT: 43.6 % (ref 39.0–52.0)
Hemoglobin: 14 g/dL (ref 13.0–17.0)
Immature Granulocytes: 0 %
Lymphocytes Relative: 35 %
Lymphs Abs: 1.6 10*3/uL (ref 0.7–4.0)
MCH: 27.5 pg (ref 26.0–34.0)
MCHC: 32.1 g/dL (ref 30.0–36.0)
MCV: 85.7 fL (ref 80.0–100.0)
Monocytes Absolute: 0.7 10*3/uL (ref 0.1–1.0)
Monocytes Relative: 15 %
Neutro Abs: 2.2 10*3/uL (ref 1.7–7.7)
Neutrophils Relative %: 47 %
Platelets: 265 10*3/uL (ref 150–400)
RBC: 5.09 MIL/uL (ref 4.22–5.81)
RDW: 14.6 % (ref 11.5–15.5)
WBC: 4.6 10*3/uL (ref 4.0–10.5)
nRBC: 0 % (ref 0.0–0.2)

## 2022-09-27 LAB — VITAMIN B12: Vitamin B-12: 176 pg/mL — ABNORMAL LOW (ref 180–914)

## 2022-09-27 MED ORDER — IOHEXOL 9 MG/ML PO SOLN
1000.0000 mL | Freq: Once | ORAL | Status: AC
  Administered 2022-09-27: 1000 mL via ORAL

## 2022-09-27 MED ORDER — IOHEXOL 300 MG/ML  SOLN
100.0000 mL | Freq: Once | INTRAMUSCULAR | Status: AC | PRN
  Administered 2022-09-27: 100 mL via INTRAVENOUS

## 2022-09-28 ENCOUNTER — Encounter: Payer: Self-pay | Admitting: Hematology

## 2022-09-28 ENCOUNTER — Inpatient Hospital Stay (HOSPITAL_BASED_OUTPATIENT_CLINIC_OR_DEPARTMENT_OTHER): Payer: 59 | Admitting: Hematology

## 2022-09-28 DIAGNOSIS — C187 Malignant neoplasm of sigmoid colon: Secondary | ICD-10-CM

## 2022-09-28 DIAGNOSIS — D518 Other vitamin B12 deficiency anemias: Secondary | ICD-10-CM

## 2022-09-28 DIAGNOSIS — D519 Vitamin B12 deficiency anemia, unspecified: Secondary | ICD-10-CM | POA: Insufficient documentation

## 2022-09-28 LAB — CEA (IN HOUSE-CHCC): CEA (CHCC-In House): <1 ng/mL (ref 0.00–5.00)

## 2022-09-28 LAB — FERRITIN: Ferritin: 6 ng/mL — ABNORMAL LOW (ref 24–336)

## 2022-09-28 NOTE — Assessment & Plan Note (Addendum)
pT3pN1bM0 stage IIIb, MMR normal, MSI-stable  -Diagnosed in November 2022, status post surgical resection by Dr. White with positive radial margin. -He received 4 cycles adjuvant chemotherapy CapeOx, completed in 11/2021 -He subsequently received concurrent chemoradiation with Xeloda, completed in May 2023 -He is on cancer surveillance, doing well -CT from 09/27/2022 showed NED  

## 2022-09-28 NOTE — Assessment & Plan Note (Signed)
-  he did not tolerate oral B12, but he admits he took '1000mg'$  BID. We again discussed B12 injections to help improve his neuropathy in his feet. After lengthy discussion, he agrees to try B12 injections if level still low.  -repeated B12 level on 09/27/2022 was still low 176

## 2022-09-29 ENCOUNTER — Telehealth: Payer: Self-pay | Admitting: Hematology

## 2022-09-29 NOTE — Telephone Encounter (Signed)
Called patient to schedule f/u. Patient notified of new appointment time.

## 2023-01-31 NOTE — Progress Notes (Unsigned)
Patient Care Team: Patient, No Pcp Per as PCP - General (General Practice) Shellia Cleverlyirigliano, Vito V, DO as Consulting Physician (Gastroenterology) Andria MeuseWhite, Christopher M, MD as Consulting Physician (General Surgery) Malachy MoodFeng, Yan, MD as Consulting Physician (Hematology) Pollyann SamplesBurton, Damondre Pfeifle K, NP as Nurse Practitioner (Nurse Practitioner)   CHIEF COMPLAINT: Follow up colon cancer   Oncology History  Malignant neoplasm of sigmoid colon  07/20/2021 Procedure   Diagnostic EGD by Dr. Barron Alvineirigliano - Normal esophagus. - Z-line regular, 40 cm from the incisors. - Normal stomach. Biopsied. - Normal examined duodenum. Biopsied. Colonoscopy - Hemorrhoids found on perianal exam. - Likely malignant completely obstructing tumor in the sigmoid colon. Biopsied. Tattooed. - Non-bleeding internal hemorrhoids.   07/20/2021 Initial Biopsy   Diagnosis 1. Duodenum, Biopsy - BENIGN DUODENAL MUCOSA - NO ACUTE INFLAMMATION, VILLOUS BLUNTING OR INCREASED INTRAEPITHELIAL LYMPHOCYTES 2. Stomach, biopsy - MILD CHRONIC GASTRITIS WITH REACTIVE CHANGES - NO H. PYLORI OR INTESTINAL METAPLASIA IDENTIFIED - SEE COMMENT 3. Sigmoid Colon Biopsy - ADENOCARCINOMA - SEE COMMENT Microscopic Comment 2. H. pylori immunohistochemistry is NEGATIVE for microorganisms.   07/20/2021 Tumor Marker   CEA normal 3.2   07/28/2021 Imaging   Staging CT CAP IMPRESSION: 1. Circumferential wall thickening of the distal sigmoid colon, in keeping with primary colon malignancy identified by colonoscopy. 2. There is some nodularity in the adjacent mesocolon about the inferior mesenteric artery, as well as prominent subcentimeter lymph nodes, worrisome for early nodal or soft tissue metastatic disease. 3. No evidence of distant metastatic disease in the chest, abdomen, or pelvis.   08/31/2021 Cancer Staging   Staging form: Colon and Rectum, AJCC 8th Edition - Pathologic stage from 08/31/2021: Stage IIIB (pT3, pN1b, cM0) - Signed by Pollyann SamplesBurton, Starlena Beil K,  NP on 09/19/2021 Stage prefix: Initial diagnosis Histologic grading system: 4 grade system Histologic grade (G): G2 Laterality: Left Lymph-vascular invasion (LVI): LVI present/identified, NOS Carcinoembryonic antigen (CEA) (ng/mL): 3.2 Perineural invasion (PNI): Absent   08/31/2021 Definitive Surgery   PREOP DIAGNOSIS: sigmoid colon cancer POSTOP DIAGNOSIS: Rectosigmoid colon cancer PROCEDURE:  Robotic assisted low anterior resection with double stapled colorectal anastomosis Intraoperative assessment of perfusion (ICG) Flexible sigmoidoscopy Bilateral transversus abdominus plane blocks SURGEON: Stephanie Couphristopher M. Cliffton AstersWhite, MD ASSISTANT: Romie LeveeAlicia Thomas, MD   08/31/2021 Pathology Results   FINAL MICROSCOPIC DIAGNOSIS: A. COLON, RECTOSIGMOID, RESECTION: - Invasive moderately differentiated adenocarcinoma, 4 cm, involving distal sigmoid colon - Carcinoma invades into subserosa - Inked radial margin is positive for carcinoma - Metastatic carcinoma to two of twenty-two lymph nodes (2/22) - See oncology table B. FINAL DISTAL MARGIN: - Colonic donut, negative for carcinoma pT3pN1b, stage IIIb MMR IHC normal MSI-stable   09/19/2021 Initial Diagnosis   Malignant neoplasm of sigmoid colon (HCC)   09/30/2021 - 12/02/2021 Chemotherapy   Patient is on Treatment Plan : COLORECTAL Xelox (Capeox) q21d     01/04/2022 Procedure   Colonoscopy, Dr. Barron Alvineirigliano  Impression: - One 5 mm polyp in the proximal transverse colon, removed with a cold snare. Resected and retrieved. - Patent end-to-end low-anterior anastomosis, located 20 cm from the anal verge, characterized by healthy appearing mucosa. Biopsied. - One 3 mm polyp in the rectosigmoid, removed with a cold snare. Resected and retrieved. - Non-bleeding internal hemorrhoids. - Stool in the entire examined colon.   01/04/2022 Pathology Results   Diagnosis 1. Surgical [P], colon, transverse, polyp (1) - TUBULAR ADENOMA WITHOUT HIGH-GRADE  DYSPLASIA. 2. Surgical [P], colon, rectum - COLONIC MUCOSA WITH CHANGES CONSISTENT WITH MUCOSAL PROLAPSE. - NO ADENOMATOUS CHANGE OR MALIGNANCY. 3.  Surgical [P], colon, rectum, polyp (1) - HYPERPLASTIC POLYP (S).    Imaging     09/28/2022 Imaging    IMPRESSION: 1. Post sigmoid resection. No evidence of recurrent or metastatic disease in the chest, abdomen or pelvis. 2. Aortic atherosclerosis.      CURRENT THERAPY: Surveillance   INTERVAL HISTORY Mr. Miyahira returns for follow up as scheduled. Last seen virtually by Dr. Mosetta Putt 09/28/22 to review CT which showed NED.   ROS   Past Medical History:  Diagnosis Date   Anemia 10/2021   IDA r/o Chemo, colon ca dx, Vitamin B12 deficiency   Arthritis    Cancer (HCC) 07/2021   Colon   Cataract    GERD (gastroesophageal reflux disease)      Past Surgical History:  Procedure Laterality Date   COLON RESECTION  08/31/2021   Colon ca, Dr Annamarie Dawley   COLONOSCOPY  01/04/2022   COLONOSCOPY WITH PROPOFOL  06/2021   FLEXIBLE SIGMOIDOSCOPY N/A 08/31/2021   Procedure: FLEXIBLE SIGMOIDOSCOPY;  Surgeon: Andria Meuse, MD;  Location: [facility];  Service: General;  Laterality: N/A;   NOSE SURGERY     WISDOM TOOTH EXTRACTION     1988     Outpatient Encounter Medications as of 02/01/2023  Medication Sig   capecitabine (XELODA) 500 MG tablet Take 3 tabs in morning and 4 tabs in evening, every 10-12 hours, on days with radiation, Monday through Friday only.   ferrous sulfate 325 (65 FE) MG EC tablet TAKE 1 TABLET (325 MG TOTAL) BY MOUTH 2 (TWO) TIMES DAILY. TAKE WITH VITAMIN C OR ORANGE JUICE PLEASE. TAKE IRON SUPPLEMENT 2 HOURS BEFORE OR 4 HOURS AFTER PPI OR OTHER ANTACIDS   ondansetron (ZOFRAN) 8 MG tablet Take 1 tablet (8 mg total) by mouth 2 (two) times daily as needed for refractory nausea / vomiting. Start on day 3 after chemotherapy.   prochlorperazine (COMPAZINE) 10 MG tablet Take 1 tablet (10 mg total) by mouth every 6 (six) hours as  needed (Nausea or vomiting).   scopolamine (TRANSDERM-SCOP) 1 MG/3DAYS Place 1 patch (1.5 mg total) onto the skin every 3 (three) days.   vitamin B-12 (CYANOCOBALAMIN) 1000 MCG tablet Take 1 tablet (1,000 mcg total) by mouth daily.   No facility-administered encounter medications on file as of 02/01/2023.     There were no vitals filed for this visit. There is no height or weight on file to calculate BMI.   PHYSICAL EXAM GENERAL:alert, no distress and comfortable SKIN: no rash  EYES: sclera clear NECK: without mass LYMPH:  no palpable cervical or supraclavicular lymphadenopathy  LUNGS: clear with normal breathing effort HEART: regular rate & rhythm, no lower extremity edema ABDOMEN: abdomen soft, non-tender and normal bowel sounds NEURO: alert & oriented x 3 with fluent speech, no focal motor/sensory deficits Breast exam:  PAC without erythema    CBC    Component Value Date/Time   WBC 4.6 09/27/2022 1547   RBC 5.09 09/27/2022 1547   HGB 14.0 09/27/2022 1547   HGB 11.9 (L) 04/03/2022 1431   HCT 43.6 09/27/2022 1547   PLT 265 09/27/2022 1547   PLT 275 04/03/2022 1431   MCV 85.7 09/27/2022 1547   MCV 85.8 09/14/2014 1142   MCH 27.5 09/27/2022 1547   MCHC 32.1 09/27/2022 1547   RDW 14.6 09/27/2022 1547   LYMPHSABS 1.6 09/27/2022 1547   MONOABS 0.7 09/27/2022 1547   EOSABS 0.1 09/27/2022 1547   BASOSABS 0.0 09/27/2022 1547     CMP  Component Value Date/Time   NA 141 09/27/2022 1547   K 4.1 09/27/2022 1547   CL 104 09/27/2022 1547   CO2 28 09/27/2022 1547   GLUCOSE 68 (L) 09/27/2022 1547   BUN 11 09/27/2022 1547   CREATININE 0.96 09/27/2022 1547   CREATININE 0.94 04/03/2022 1431   CREATININE 0.92 07/15/2021 1411   CALCIUM 9.4 09/27/2022 1547   PROT 8.2 (H) 09/27/2022 1547   ALBUMIN 4.0 09/27/2022 1547   AST 24 09/27/2022 1547   AST 28 04/03/2022 1431   ALT 12 09/27/2022 1547   ALT 12 04/03/2022 1431   ALKPHOS 88 09/27/2022 1547   BILITOT 0.5 09/27/2022 1547    BILITOT 0.5 04/03/2022 1431   GFRNONAA >60 09/27/2022 1547   GFRNONAA >60 04/03/2022 1431     ASSESSMENT & PLAN:  PLAN:  No orders of the defined types were placed in this encounter.     All questions were answered. The patient knows to call the clinic with any problems, questions or concerns. No barriers to learning were detected. I spent *** counseling the patient face to face. The total time spent in the appointment was *** and more than 50% was on counseling, review of test results, and coordination of care.   Santiago Glad, NP-C @DATE @

## 2023-02-01 ENCOUNTER — Inpatient Hospital Stay (HOSPITAL_BASED_OUTPATIENT_CLINIC_OR_DEPARTMENT_OTHER): Payer: 59 | Admitting: Nurse Practitioner

## 2023-02-01 ENCOUNTER — Encounter: Payer: Self-pay | Admitting: Nurse Practitioner

## 2023-02-01 ENCOUNTER — Other Ambulatory Visit: Payer: Self-pay

## 2023-02-01 ENCOUNTER — Inpatient Hospital Stay: Payer: 59 | Attending: Nurse Practitioner

## 2023-02-01 VITALS — BP 137/75 | HR 62 | Temp 98.5°F | Resp 18 | Wt 201.4 lb

## 2023-02-01 DIAGNOSIS — C187 Malignant neoplasm of sigmoid colon: Secondary | ICD-10-CM

## 2023-02-01 DIAGNOSIS — E538 Deficiency of other specified B group vitamins: Secondary | ICD-10-CM

## 2023-02-01 DIAGNOSIS — D519 Vitamin B12 deficiency anemia, unspecified: Secondary | ICD-10-CM | POA: Insufficient documentation

## 2023-02-01 DIAGNOSIS — D500 Iron deficiency anemia secondary to blood loss (chronic): Secondary | ICD-10-CM

## 2023-02-01 LAB — COMPREHENSIVE METABOLIC PANEL
ALT: 10 U/L (ref 0–44)
AST: 19 U/L (ref 15–41)
Albumin: 4.1 g/dL (ref 3.5–5.0)
Alkaline Phosphatase: 90 U/L (ref 38–126)
Anion gap: 8 (ref 5–15)
BUN: 12 mg/dL (ref 6–20)
CO2: 28 mmol/L (ref 22–32)
Calcium: 9.2 mg/dL (ref 8.9–10.3)
Chloride: 104 mmol/L (ref 98–111)
Creatinine, Ser: 0.94 mg/dL (ref 0.61–1.24)
GFR, Estimated: >60 mL/min (ref >60)
Glucose, Bld: 143 mg/dL — ABNORMAL HIGH (ref 70–99)
Potassium: 3.6 mmol/L (ref 3.5–5.1)
Sodium: 140 mmol/L (ref 135–145)
Total Bilirubin: 0.5 mg/dL (ref 0.3–1.2)
Total Protein: 7.7 g/dL (ref 6.5–8.1)

## 2023-02-01 LAB — CBC WITH DIFFERENTIAL/PLATELET
Abs Immature Granulocytes: 0.01 10*3/uL (ref 0.00–0.07)
Basophils Absolute: 0.1 10*3/uL (ref 0.0–0.1)
Basophils Relative: 1 %
Eosinophils Absolute: 0.2 10*3/uL (ref 0.0–0.5)
Eosinophils Relative: 4 %
HCT: 39.8 % (ref 39.0–52.0)
Hemoglobin: 12.8 g/dL — ABNORMAL LOW (ref 13.0–17.0)
Immature Granulocytes: 0 %
Lymphocytes Relative: 33 %
Lymphs Abs: 1.2 10*3/uL (ref 0.7–4.0)
MCH: 27.8 pg (ref 26.0–34.0)
MCHC: 32.2 g/dL (ref 30.0–36.0)
MCV: 86.5 fL (ref 80.0–100.0)
Monocytes Absolute: 0.5 10*3/uL (ref 0.1–1.0)
Monocytes Relative: 13 %
Neutro Abs: 1.8 10*3/uL (ref 1.7–7.7)
Neutrophils Relative %: 49 %
Platelets: 251 10*3/uL (ref 150–400)
RBC: 4.6 MIL/uL (ref 4.22–5.81)
RDW: 13.9 % (ref 11.5–15.5)
WBC: 3.6 10*3/uL — ABNORMAL LOW (ref 4.0–10.5)
nRBC: 0 % (ref 0.0–0.2)

## 2023-02-01 LAB — FERRITIN: Ferritin: 7 ng/mL — ABNORMAL LOW (ref 24–336)

## 2023-02-01 LAB — CEA (IN HOUSE-CHCC): CEA (CHCC-In House): <1 ng/mL (ref 0.00–5.00)

## 2023-02-01 LAB — VITAMIN B12: Vitamin B-12: 135 pg/mL — ABNORMAL LOW (ref 180–914)

## 2023-02-02 ENCOUNTER — Telehealth: Payer: Self-pay

## 2023-02-02 NOTE — Telephone Encounter (Signed)
-----   Message from Shellia Cleverly, DO sent at 02/01/2023  4:25 PM EDT ----- Can you please schedule this patient for repeat colonoscopy with me for ongoing colon cancer surveillance.  Thanks. ----- Message ----- From: Pollyann Samples, NP Sent: 02/01/2023  12:58 PM EDT To: Shellia Cleverly, DO  Dr. Barron Alvine,  Mr. Cauthon is overdue for colonoscopy. Doing well from cancer standpoint. He is open to scheduling if your office could please contact him.   Thanks, Clayborn Heron NP

## 2023-02-02 NOTE — Telephone Encounter (Signed)
Patient is scheduled Colonoscopy with Dr. Barron Alvine on 02/27/23 at 4:00 PM, Previsit on 02/14/23 at 10:00 PM.

## 2023-02-07 ENCOUNTER — Telehealth: Payer: Self-pay | Admitting: *Deleted

## 2023-02-07 ENCOUNTER — Encounter: Payer: Self-pay | Admitting: Nurse Practitioner

## 2023-02-07 NOTE — Telephone Encounter (Signed)
Pt agreed to doing the IV iron and B12 injection. I explained to him how B12 is given as well as the process with IV iron. Pt agreed. Message was sent to Lacie [name],NP to plan accordingly.

## 2023-02-09 ENCOUNTER — Other Ambulatory Visit: Payer: Self-pay | Admitting: Nurse Practitioner

## 2023-02-12 ENCOUNTER — Telehealth: Payer: Self-pay | Admitting: Nurse Practitioner

## 2023-02-12 NOTE — Telephone Encounter (Signed)
Contacted patient to scheduled appointments. Left message with appointment details and a call back number if patient had any questions or could not accommodate the time we provided.   

## 2023-02-14 ENCOUNTER — Encounter: Payer: Self-pay | Admitting: Gastroenterology

## 2023-02-14 ENCOUNTER — Ambulatory Visit (AMBULATORY_SURGERY_CENTER): Payer: 59 | Admitting: *Deleted

## 2023-02-14 VITALS — Ht 73.0 in | Wt 203.0 lb

## 2023-02-14 DIAGNOSIS — Z85038 Personal history of other malignant neoplasm of large intestine: Secondary | ICD-10-CM

## 2023-02-14 DIAGNOSIS — Z86010 Personal history of colonic polyps: Secondary | ICD-10-CM

## 2023-02-14 MED ORDER — NA SULFATE-K SULFATE-MG SULF 17.5-3.13-1.6 GM/177ML PO SOLN: 1.0000 | Freq: Once | ORAL | 0 refills | Status: AC

## 2023-02-14 NOTE — Progress Notes (Signed)
No egg or soy allergy known to patient  No issues known to pt with past sedation with any surgeries or procedures Patient denies ever being told they had issues or difficulty with intubation  No FH of Malignant Hyperthermia Pt is not on diet pills Pt is not on  home 02  Pt is not on blood thinners  Pt denies issues with constipation  No A fib or A flutter Have any cardiac testing pending--NO Pt instructed to use Single[employer] or GoodRx for a price reduction on prep  INDEPENDENTLY MOBILE  Patient's chart reviewed by Cathlyn Parsons CNRA prior to previsit and patient appropriate for the [facility].  Previsit completed and red dot placed by patient's name on their procedure day (on provider's schedule).

## 2023-02-17 ENCOUNTER — Inpatient Hospital Stay: Payer: 59

## 2023-02-17 VITALS — BP 142/76 | HR 72 | Temp 98.3°F | Resp 18

## 2023-02-17 DIAGNOSIS — Z9049 Acquired absence of other specified parts of digestive tract: Secondary | ICD-10-CM

## 2023-02-17 DIAGNOSIS — C187 Malignant neoplasm of sigmoid colon: Secondary | ICD-10-CM | POA: Diagnosis not present

## 2023-02-17 DIAGNOSIS — E860 Dehydration: Secondary | ICD-10-CM

## 2023-02-17 MED ORDER — SODIUM CHLORIDE 0.9 % IV SOLN
400.0000 mg | Freq: Once | INTRAVENOUS | Status: AC
  Administered 2023-02-17: 400 mg via INTRAVENOUS
  Filled 2023-02-17: qty 20

## 2023-02-17 MED ORDER — CYANOCOBALAMIN 1000 MCG/ML IJ SOLN
1000.0000 ug | Freq: Once | INTRAMUSCULAR | Status: AC
  Administered 2023-02-17: 1000 ug via INTRAMUSCULAR

## 2023-02-17 MED ORDER — SODIUM CHLORIDE 0.9 % IV SOLN: Freq: Once | INTRAVENOUS | Status: AC

## 2023-02-17 NOTE — Patient Instructions (Signed)

## 2023-02-17 NOTE — Progress Notes (Signed)
Pt tolerated first Venofer infusion well without difficulty. Pt stayed for 30 minutes post observation. Vital signs stable, recieved AVS, pt receptive to instructions, ambulatory at discharge.

## 2023-02-20 ENCOUNTER — Encounter: Payer: Self-pay | Admitting: Certified Registered Nurse Anesthetist

## 2023-02-23 MED FILL — Iron Sucrose Inj 20 MG/ML (Fe Equiv): INTRAVENOUS | Qty: 20 | Status: AC

## 2023-02-24 ENCOUNTER — Other Ambulatory Visit: Payer: Self-pay

## 2023-02-24 ENCOUNTER — Inpatient Hospital Stay: Payer: 59 | Attending: Hematology

## 2023-02-24 VITALS — BP 143/98 | HR 81 | Temp 98.0°F | Resp 18

## 2023-02-24 DIAGNOSIS — Z9049 Acquired absence of other specified parts of digestive tract: Secondary | ICD-10-CM

## 2023-02-24 DIAGNOSIS — C187 Malignant neoplasm of sigmoid colon: Secondary | ICD-10-CM | POA: Insufficient documentation

## 2023-02-24 DIAGNOSIS — E860 Dehydration: Secondary | ICD-10-CM

## 2023-02-24 DIAGNOSIS — D519 Vitamin B12 deficiency anemia, unspecified: Secondary | ICD-10-CM | POA: Insufficient documentation

## 2023-02-24 DIAGNOSIS — D649 Anemia, unspecified: Secondary | ICD-10-CM | POA: Insufficient documentation

## 2023-02-24 MED ORDER — SODIUM CHLORIDE 0.9 % IV SOLN: Freq: Once | INTRAVENOUS | Status: AC

## 2023-02-24 MED ORDER — CYANOCOBALAMIN 1000 MCG/ML IJ SOLN
1000.0000 ug | Freq: Once | INTRAMUSCULAR | Status: AC
  Administered 2023-02-24: 1000 ug via INTRAMUSCULAR
  Filled 2023-02-24: qty 1

## 2023-02-24 MED ORDER — SODIUM CHLORIDE 0.9 % IV SOLN
400.0000 mg | Freq: Once | INTRAVENOUS | Status: AC
  Administered 2023-02-24: 400 mg via INTRAVENOUS
  Filled 2023-02-24: qty 10

## 2023-02-24 NOTE — Patient Instructions (Addendum)
Vitamin B12 and Folate Test Why am I having this test? Vitamin B12 and folate (folic acid) are B vitamins needed to make red blood cells and keep your nervous system healthy. Vitamin B12 is in foods such as meats, eggs, dairy products, and fish. Folate is in fruits, beans, and leafy green vegetables. Some foods, such as whole grains, bread, and cereals have vitamin B12 added to them (are fortified). You may not have enough of these B vitamins (have a deficiency) if your diet lacks these vitamins. Low levels can also be caused by diseases or having had surgeries on your stomach or small intestine that interfere with your ability to absorb the vitamins from your food. You may have a vitamin B12 and folate test if: You have symptoms of vitamin B12 or folate deficiency, such as tiredness (fatigue), headache, confusion, poor balance, or tingling and numbness in your hands and feet. You are pregnant or breastfeeding. Women who are pregnant or breastfeeding need more folate and may need to take dietary supplements. Your red blood cell count is low (anemia). You are an older person and have mental confusion. You have a disease or condition that may lead to a deficiency of these B vitamins. What is being tested? This test measures the amount of vitamin B12 and folate in your blood. The tests for vitamin B12 and folate may be done together or separately. What kind of sample is taken?  A blood sample is required for this test. It is usually collected by inserting a needle into a blood vessel. How do I prepare for this test? Follow instructions from your health care provider about eating and drinking before the test. Tell a health care provider about: All medicines you are taking, including vitamins, herbs, eye drops, creams, and over-the-counter medicines. Any medical conditions you have. Whether you are pregnant or may be pregnant. How often you drink alcohol. How are the results reported? Your test  results will be reported as values that identify the amount of vitamin B12 and folate in your blood. Your health care provider will compare your results to normal ranges that were established after testing a large group of people (reference ranges). Reference ranges may vary among labs and hospitals. For this test, common reference ranges are: Vitamin B12: 160-950 pg/mL or 118-701 pmol/L (SI units). Folate: 5-25 ng/mL or 11-57 nmol/L (SI units). What do the results mean? Results within the reference range are considered normal. Vitamin B12 or folate levels that are lower than the reference range may be caused by: Poor nutrition or eating a vegetarian or vegan diet that does not include any foods that come from animals. Having alcoholism. Having certain diseases that make it hard to absorb vitamin B12. These diseases include Crohn's disease, chronic pancreatitis, and cystic fibrosis. Taking certain medicines. Having had surgeries on your stomach or small intestine. High levels of vitamin B12 are rare, but they may happen if you have: Cancer. Liver disease. High levels of folate may happen if: You have anemia. You are vegetarian. You have had a recent blood transfusion. Talk with your health care provider about what your results mean. Questions to ask your health care provider Ask your health care provider, or the department that is doing the test: When will my results be ready? How will I get my results? What are my treatment options? What other tests do I need? What are my next steps? Summary Vitamin B12 and folate (folic acid) are both B vitamins that are needed to   make red blood cells and to keep your nervous system healthy. You may not have enough B vitamins in your body if you do not get enough in your diet or if you have a disease that makes it hard to absorb vitamin B12. This test measures the amount of vitamin B12 and folate in your blood. A blood sample is required for the  test. Talk with your health care provider about what your results mean. This information is not intended to replace advice given to you by your health care provider. Make sure you discuss any questions you have with your health care provider. Document Revised: 06/03/2021 Document Reviewed: 06/03/2021 Elsevier Patient Education  2023 Elsevier Inc[employer] Iron Sucrose Injection What is this medication? IRON SUCROSE (EYE ern SOO krose) treats low levels of iron (iron deficiency anemia) in people with kidney disease. Iron is a mineral that plays an important role in making red blood cells, which carry oxygen from your lungs to the rest of your body. This medicine may be used for other purposes; ask your health care provider or pharmacist if you have questions. COMMON BRAND NAME(S): Venofer What should I tell my care team before I take this medication? They need to know if you have any of these conditions: Anemia not caused by low iron levels Heart disease High levels of iron in the blood Kidney disease Liver disease An unusual or allergic reaction to iron, other medications, foods, dyes, or preservatives Pregnant or trying to get pregnant Breastfeeding How should I use this medication? This medication is for infusion into a vein. It is given in a hospital or clinic setting. Talk to your care team about the use of this medication in children. While this medication may be prescribed for children as young as 2 years for selected conditions, precautions do apply. Overdosage: If you think you have taken too much of this medicine contact a poison control center or emergency room at once. NOTE: This medicine is only for you. Do not share this medicine with others. What if I miss a dose? Keep appointments for follow-up doses. It is important not to miss your dose. Call your care team if you are unable to keep an appointment. What may interact with this medication? Do not take this medication with any of  the following: Deferoxamine Dimercaprol Other iron products This medication may also interact with the following: Chloramphenicol Deferasirox This list may not describe all possible interactions. Give your health care provider a list of all the medicines, herbs, non-prescription drugs, or dietary supplements you use. Also tell them if you smoke, drink alcohol, or use illegal drugs. Some items may interact with your medicine. What should I watch for while using this medication? Visit your care team regularly. Tell your care team if your symptoms do not start to get better or if they get worse. You may need blood work done while you are taking this medication. You may need to follow a special diet. Talk to your care team. Foods that contain iron include: whole grains/cereals, dried fruits, beans, or peas, leafy green vegetables, and organ meats (liver, kidney). What side effects may I notice from receiving this medication? Side effects that you should report to your care team as soon as possible: Allergic reactions--skin rash, itching, hives, swelling of the face, lips, tongue, or throat Low blood pressure--dizziness, feeling faint or lightheaded, blurry vision Shortness of breath Side effects that usually do not require medical attention (report to your care team if they continue  or are bothersome): Flushing Headache Joint pain Muscle pain Nausea Pain, redness, or irritation at injection site This list may not describe all possible side effects. Call your doctor for medical advice about side effects. You may report side effects to FDA at 1-800-FDA-1088. Where should I keep my medication? This medication is given in a hospital or clinic and will not be stored at home. NOTE: This sheet is a summary. It may not cover all possible information. If you have questions about this medicine, talk to your doctor, pharmacist, or health care provider.  2023 Elsevier/Gold Standard (2021-01-20  00:00:00) Vitamin B12 Deficiency Vitamin B12 deficiency occurs when the body does not have enough of this important vitamin. The body needs this vitamin: To make red blood cells. To make DNA. This is the genetic material inside cells. To help the nerves work properly so they can carry messages from the brain to the body. Vitamin B12 deficiency can cause health problems, such as not having enough red blood cells in the blood (anemia). This can lead to nerve damage if untreated. What are the causes? This condition may be caused by: Not eating enough foods that contain vitamin B12. Not having enough stomach acid and digestive fluids to properly absorb vitamin B12 from the food that you eat. Having certain diseases that make it hard to absorb vitamin B12. These diseases include Crohn's disease, chronic pancreatitis, and cystic fibrosis. An autoimmune disorder in which the body does not make enough of a protein (intrinsic factor) within the stomach, resulting in not enough absorption of vitamin B12. Having a surgery in which part of the stomach or small intestine is removed. Taking certain medicines that make it hard for the body to absorb vitamin B12. These include: Heartburn medicines, such as antacids and proton pump inhibitors. Some medicines that are used to treat diabetes. What increases the risk? The following factors may make you more likely to develop a vitamin B12 deficiency: Being an older adult. Eating a vegetarian or vegan diet that does not include any foods that come from animals. Eating a poor diet while you are pregnant. Taking certain medicines. Having alcoholism. What are the signs or symptoms? In some cases, there are no symptoms of this condition. If the condition leads to anemia or nerve damage, various symptoms may occur, such as: Weakness. Tiredness (fatigue). Loss of appetite. Numbness or tingling in your hands and feet. Redness and burning of the  tongue. Depression, confusion, or memory problems. Trouble walking. If anemia is severe, symptoms can include: Shortness of breath. Dizziness. Rapid heart rate. How is this diagnosed? This condition may be diagnosed with a blood test to measure the level of vitamin B12 in your blood. You may also have other tests, including: A group of tests that measure certain characteristics of blood cells (complete blood count, CBC). A blood test to measure intrinsic factor. A procedure where a thin tube with a camera on the end is used to look into your stomach or intestines (endoscopy). Other tests may be needed to discover the cause of the deficiency. How is this treated? Treatment for this condition depends on the cause. This condition may be treated by: Changing your eating and drinking habits, such as: Eating more foods that contain vitamin B12. Drinking less alcohol or no alcohol. Getting vitamin B12 injections. Taking vitamin B12 supplements by mouth (orally). Your health care provider will tell you which dose is best for you. Follow these instructions at home: Eating and drinking  Include foods  in your diet that come from animals and contain a lot of vitamin B12. These include: Meats and poultry. This includes beef, pork, chicken, [place], and organ meats, such as liver. Seafood. This includes clams, rainbow trout, salmon, tuna, and haddock. Eggs. Dairy foods such as milk, yogurt, and cheese. Eat foods that have vitamin B12 added to them (are fortified), such as ready-to-eat breakfast cereals. Check the label on the package to see if a food is fortified. The items listed above may not be a complete list of foods and beverages you can eat and drink. Contact a dietitian for more information. Alcohol use Do not drink alcohol if: Your health care provider tells you not to drink. You are pregnant, may be pregnant, or are planning to become pregnant. If you drink alcohol: Limit how much you  have to: 0-1 drink a day for women. 0-2 drinks a day for men. Know how much alcohol is in your drink. In the [place] one drink equals one 12 oz bottle of beer (355 mL), one 5 oz glass of wine (148 mL), or one 1 oz glass of hard liquor (44 mL). General instructions Get vitamin B12 injections if told to by your health care provider. Take supplements only as told by your health care provider. Follow the directions carefully. Keep all follow-up visits. This is important. Contact a health care provider if: Your symptoms come back. Your symptoms get worse or do not improve with treatment. Get help right away: You develop shortness of breath. You have a rapid heart rate. You have chest pain. You become dizzy or you faint. These symptoms may be an emergency. Get help right away. Call 911. Do not wait to see if the symptoms will go away. Do not drive yourself to the hospital. Summary Vitamin B12 deficiency occurs when the body does not have enough of this important vitamin. Common causes include not eating enough foods that contain vitamin B12, not being able to absorb vitamin B12 from the food that you eat, having a surgery in which part of the stomach or small intestine is removed, or taking certain medicines. Eat foods that have vitamin B12 in them. Treatment may include making a change in the way you eat and drink, getting vitamin B12 injections, or taking vitamin B12 supplements. This information is not intended to replace advice given to you by your health care provider. Make sure you discuss any questions you have with your health care provider. Document Revised: 06/03/2021 Document Reviewed: 06/03/2021 Elsevier Patient Education  2023 [employer].

## 2023-02-27 ENCOUNTER — Ambulatory Visit: Payer: 59 | Admitting: Gastroenterology

## 2023-02-27 ENCOUNTER — Encounter: Payer: Self-pay | Admitting: Gastroenterology

## 2023-02-27 VITALS — BP 151/86 | HR 88 | Temp 97.9°F | Resp 22 | Ht 73.0 in | Wt 203.0 lb

## 2023-02-27 DIAGNOSIS — Z85038 Personal history of other malignant neoplasm of large intestine: Secondary | ICD-10-CM

## 2023-02-27 DIAGNOSIS — Z86010 Personal history of colonic polyps: Secondary | ICD-10-CM

## 2023-02-27 MED ORDER — SODIUM CHLORIDE 0.9 % IV SOLN
500.0000 mL | Freq: Once | INTRAVENOUS | Status: DC
Start: 2023-02-27 — End: 2023-03-01

## 2023-02-27 NOTE — Progress Notes (Unsigned)
Vitals-KW  Pt's states no medical or surgical changes since previsit or office visit. 

## 2023-02-27 NOTE — Progress Notes (Unsigned)
GASTROENTEROLOGY PROCEDURE H&P NOTE   Primary Care Physician: Patient, No Pcp Per    Reason for Procedure:   Colon cancer surveillance  Plan:    Colonoscopy (procedure canceled as outlined below)    Unfortunately, despite our staff clearly explaining to the patient and his daughter (serving as his care partner today) that she cannot leave the building prior to starting the procedure, she left.  I explained to the patient that we cannot start his procedure without a ride/escort on the grounds.  He called his daughter who is more than 20 minutes away from the building.  It is now 40 minutes past his scheduled start time and his daughter is still not near the building.  I explained to the patient that we need to cancel the procedure, and he understands.  Plan for light meal this evening, then resume clears and clears through tomorrow with MiraLAX prep tomorrow evening and I found a spot for him in the [facility] on 03/01/2023.  He finds this to be an acceptable solution and understands that his care partner needs to remain here in the building for the entirety of his appointment.    HPI: Ethan Martin is a 57 y.o. male who presents for Colonoscopy for ongoing surveillance.   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  H/o iron deficiency and B12 deficiency related to his malignancy. - 07/22/2021: Colonoscopy with nontraversable mass in sigmoid colon, located 32 cm from anal verge.  Biopsies confirmed adenocarcinoma.  CEA 3.2. - 07/29/2021: CT C/A/P: Circumferential wall thickening of distal sigmoid colon.  Some nodularity and adjacent mesocolon around the IMA as well as prominent subcentimeter lymph nodes, worrisome for early nodal or soft tissue metastatic disease.  No evidence of distant metastatic disease. - 08/31/2021: Surgical resection by Dr. Cliffton Asters (robotic assisted LAR with anastomosis at 18 cm) with metastatic carcinoma  in 2/22 lymph nodes with lymphovascular invasion.  Question of positive surgical margin, but dona ("final distal margin") negative for carcinoma. - Evaluated by Dr. Mosetta Putt in hematology/oncology clinic.  Started CAPOX with Xeloda x3 months, then will treat with radiation x5-6 weeks  - 01/04/2022: Colonoscopy: 5 mm transverse colon adenoma, healthy-appearing anastomosis at 20 cm (path benign).  3 mm polyp in rectosigmoid located 2 cm distal to the anastomosis removed with cold snare.  Grade 2 internal hemorrhoids.  Moderate stool throughout the visualized colon.  Recommended repeat in 1 year for continued short interval surveillance.  Past Medical History:  Diagnosis Date   Anemia 10/2021   IDA r/o Chemo, colon ca dx, Vitamin B12 deficiency   Arthritis    Cancer (HCC) 07/2021   Colon   Cataract    LEFT EYE   GERD (gastroesophageal reflux disease)     Past Surgical History:  Procedure Laterality Date   COLON RESECTION  08/31/2021   Colon ca, Dr Annamarie Dawley   COLONOSCOPY  01/04/2022   COLONOSCOPY WITH PROPOFOL  06/2021   FLEXIBLE SIGMOIDOSCOPY N/A 08/31/2021   Procedure: FLEXIBLE SIGMOIDOSCOPY;  Surgeon: Andria Meuse, MD;  Location: [facility];  Service: General;  Laterality: N/A;   NOSE SURGERY     WISDOM TOOTH EXTRACTION     1988    Prior to Admission medications   Not on File    No current outpatient medications on file.   Current Facility-Administered Medications  Medication Dose Route Frequency Provider Last Rate Last Admin   0.9 %  sodium chloride infusion  500 mL Intravenous Once Nazaria Riesen V, DO        Allergies as of 02/27/2023   (No Known Allergies)    Family History  Problem Relation Age of Onset   Healthy Mother    Healthy Father    Cancer Maternal Aunt        unknown type   Cancer Paternal Aunt        breast   Colon cancer Neg Hx    Esophageal cancer Neg Hx    Rectal cancer Neg Hx    Stomach cancer Neg Hx    Colon polyps Neg Hx    Crohn's  disease Neg Hx    Ulcerative colitis Neg Hx     Social History   Socioeconomic History   Marital status: Divorced    Spouse name: Not on file   Number of children: Not on file   Years of education: Not on file   Highest education level: Not on file  Occupational History   Not on file  Tobacco Use   Smoking status: Never   Smokeless tobacco: Never  Vaping Use   Vaping Use: Never used  Substance and Sexual Activity   Alcohol use: Yes    Alcohol/week: 5.0 - 6.0 standard drinks of alcohol    Types: 2 Cans of beer, 3 - 4 Shots of liquor per week    Comment: 2 X A WEEK   Drug use: Never   Sexual activity: Not Currently  Other Topics Concern   Not on file  Social History Narrative   Not on file   Social Determinants of Health   Financial Resource Strain: Not on file  Food Insecurity: Not on file  Transportation Needs: Not on file  Physical Activity: Not on file  Stress: Not on file  Social Connections: Not on file  Intimate Partner Violence: Not on file    Physical Exam: Vital signs in last 24 hours: @BP  (!) 144/82   Pulse 71   Temp 97.9 F (36.6 C) (Temporal)   Resp 12   Ht 6\' 1"  (1.854 m)   Wt 203 lb (92.1 kg)   SpO2 100%   BMI 26.78 kg/m  GEN: NAD EYE: Sclerae anicteric ENT: MMM CV: Non-tachycardic Pulm: CTA b/l GI: Soft, NT/ND NEURO:  Alert & Oriented x 3   Doristine Locks, DO [facility]   02/27/2023 4:11 PM

## 2023-03-01 ENCOUNTER — Ambulatory Visit (AMBULATORY_SURGERY_CENTER): Payer: 59 | Admitting: Gastroenterology

## 2023-03-01 ENCOUNTER — Encounter: Payer: Self-pay | Admitting: Gastroenterology

## 2023-03-01 VITALS — BP 120/77 | HR 77 | Temp 98.8°F | Resp 16 | Ht 73.0 in | Wt 203.0 lb

## 2023-03-01 DIAGNOSIS — Z85038 Personal history of other malignant neoplasm of large intestine: Secondary | ICD-10-CM

## 2023-03-01 DIAGNOSIS — Z86010 Personal history of colonic polyps: Secondary | ICD-10-CM

## 2023-03-01 DIAGNOSIS — K640 First degree hemorrhoids: Secondary | ICD-10-CM

## 2023-03-01 DIAGNOSIS — D122 Benign neoplasm of ascending colon: Secondary | ICD-10-CM | POA: Diagnosis not present

## 2023-03-01 DIAGNOSIS — Z08 Encounter for follow-up examination after completed treatment for malignant neoplasm: Secondary | ICD-10-CM | POA: Diagnosis present

## 2023-03-01 MED ORDER — SODIUM CHLORIDE 0.9 % IV SOLN
500.0000 mL | Freq: Once | INTRAVENOUS | Status: DC
Start: 2023-03-01 — End: 2023-03-01

## 2023-03-01 NOTE — Progress Notes (Signed)
Called to room to assist during endoscopic procedure.  Patient ID and intended procedure confirmed with present staff. Received instructions for my participation in the procedure from the performing physician.  

## 2023-03-01 NOTE — Progress Notes (Signed)
GASTROENTEROLOGY PROCEDURE H&P NOTE   Primary Care Physician: Patient, No Pcp Per    Reason for Procedure:   Colon cancer surveillance  Plan:    Colonoscopy  Patient is appropriate for endoscopic procedure(s) in the ambulatory ([facility]) setting.  The nature of the procedure, as well as the risks, benefits, and alternatives were carefully and thoroughly reviewed with the patient. Ample time for discussion and questions allowed. The patient understood, was satisfied, and agreed to proceed.     HPI: Ethan Martin is a 57 y.o. male who presents for Colonoscopy for ongoing surveillance.    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  H/o iron deficiency and B12 deficiency related to his malignancy. - 07/22/2021: Colonoscopy with nontraversable mass in sigmoid colon, located 32 cm from anal verge.  Biopsies confirmed adenocarcinoma.  CEA 3.2. - 07/29/2021: CT C/A/P: Circumferential wall thickening of distal sigmoid colon.  Some nodularity and adjacent mesocolon around the IMA as well as prominent subcentimeter lymph nodes, worrisome for early nodal or soft tissue metastatic disease.  No evidence of distant metastatic disease. - 08/31/2021: Surgical resection by Dr. Cliffton Asters (robotic assisted LAR with anastomosis at 18 cm) with metastatic carcinoma in 2/22 lymph nodes with lymphovascular invasion.  Question of positive surgical margin, but dona ("final distal margin") negative for carcinoma. - Evaluated by Dr. Mosetta Putt in hematology/oncology clinic.  Started CAPOX with Xeloda x3 months, then will treat with radiation x5-6 weeks  - 01/04/2022: Colonoscopy: 5 mm transverse colon adenoma, healthy-appearing anastomosis at 20 cm (path benign).  3 mm polyp in rectosigmoid located 2 cm distal to the anastomosis removed with cold snare.  Grade 2 internal hemorrhoids.  Moderate stool throughout the visualized colon.  Recommended repeat in 1 year  for continued short interval surveillance.  Past Medical History:  Diagnosis Date   Anemia 10/2021   IDA r/o Chemo, colon ca dx, Vitamin B12 deficiency   Arthritis    Cancer (HCC) 07/2021   Colon   Cataract    LEFT EYE   GERD (gastroesophageal reflux disease)     Past Surgical History:  Procedure Laterality Date   COLON RESECTION  08/31/2021   Colon ca, Dr Annamarie Dawley   COLONOSCOPY  01/04/2022   COLONOSCOPY WITH PROPOFOL  06/2021   FLEXIBLE SIGMOIDOSCOPY N/A 08/31/2021   Procedure: FLEXIBLE SIGMOIDOSCOPY;  Surgeon: Andria Meuse, MD;  Location: [facility];  Service: General;  Laterality: N/A;   NOSE SURGERY     WISDOM TOOTH EXTRACTION     1988    Prior to Admission medications   Not on File    No current outpatient medications on file.   Current Facility-Administered Medications  Medication Dose Route Frequency Provider Last Rate Last Admin   0.9 %  sodium chloride infusion  500 mL Intravenous Once Lissandra Keil V, DO        Allergies as of 03/01/2023   (No Known Allergies)    Family History  Problem Relation Age of Onset   Healthy Mother    Healthy Father    Cancer Maternal Aunt        unknown type   Cancer Paternal Aunt        breast   Colon cancer Neg Hx    Esophageal cancer Neg Hx    Rectal cancer Neg Hx    Stomach cancer Neg Hx    Colon polyps Neg Hx    Crohn's disease Neg Hx  Ulcerative colitis Neg Hx     Social History   Socioeconomic History   Marital status: Divorced    Spouse name: Not on file   Number of children: Not on file   Years of education: Not on file   Highest education level: Not on file  Occupational History   Not on file  Tobacco Use   Smoking status: Never   Smokeless tobacco: Never  Vaping Use   Vaping Use: Never used  Substance and Sexual Activity   Alcohol use: Yes    Alcohol/week: 5.0 - 6.0 standard drinks of alcohol    Types: 2 Cans of beer, 3 - 4 Shots of liquor per week    Comment: 2 X A WEEK    Drug use: Never   Sexual activity: Not Currently  Other Topics Concern   Not on file  Social History Narrative   Not on file   Social Determinants of Health   Financial Resource Strain: Not on file  Food Insecurity: Not on file  Transportation Needs: Not on file  Physical Activity: Not on file  Stress: Not on file  Social Connections: Not on file  Intimate Partner Violence: Not on file    Physical Exam: Vital signs in last 24 hours: @BP  118/74   Pulse 70   Temp 98.8 F (37.1 C)   Ht 6\' 1"  (1.854 m)   Wt 203 lb (92.1 kg)   SpO2 99%   BMI 26.78 kg/m  GEN: NAD EYE: Sclerae anicteric ENT: MMM CV: Non-tachycardic Pulm: CTA b/l GI: Soft, NT/ND NEURO:  Alert & Oriented x 3   Doristine Locks, DO [facility]   03/01/2023 12:42 PM

## 2023-03-01 NOTE — Progress Notes (Signed)
Pt resting comfortably. VSS. Airway intact. SBAR complete to RN. All questions answered.   

## 2023-03-01 NOTE — Progress Notes (Signed)
Pt's states no medical or surgical changes since previsit or office visit. 

## 2023-03-01 NOTE — Op Note (Signed)
[facility] Patient Name: Ethan Martin Procedure Date: 03/01/2023 12:43 PM MRN: [id number] Endoscopist: Doristine Locks , MD, [id number] Age: 57 Referring MD:  Date of Birth: 12-Oct-1966 Gender: Male Account #: [id number] Procedure:                Colonoscopy Indications:              High risk colon cancer surveillance: Personal                            history of colon cancer and colon polyps                           Last colonoscopy was 12/2021 and notable for 5 mm                            transverse colon adenoma, healthy-appearing                            anastomosis at 20 cm (path benign). 3 mm polyp in                            rectosigmoid located 2 cm distal to the anastomosis                            removed with cold snare. Grade 2 internal                            hemorrhoids. Moderate stool throughout the                            visualized colon. Recommended repeat in 1 year for                            continued short interval surveillance. Medicines:                Monitored Anesthesia Care Procedure:                Pre-Anesthesia Assessment:                           - Prior to the procedure, a History and Physical                            was performed, and patient medications and                            allergies were reviewed. The patient's tolerance of                            previous anesthesia was also reviewed. The risks                            and benefits of the procedure and the sedation  options and risks were discussed with the patient.                            All questions were answered, and informed consent                            was obtained. Prior Anticoagulants: The patient has                            taken no anticoagulant or antiplatelet agents. ASA                            Grade Assessment: II - A patient with mild systemic                            disease. After reviewing the  risks and benefits,                            the patient was deemed in satisfactory condition to                            undergo the procedure.                           After obtaining informed consent, the colonoscope                            was passed under direct vision. Throughout the                            procedure, the patient's blood pressure, pulse, and                            oxygen saturations were monitored continuously. The                            Olympus SN [id number] was introduced through the anus                            and advanced to the the cecum, identified by                            appendiceal orifice and ileocecal valve. The                            colonoscopy was performed without difficulty. The                            patient tolerated the procedure well. The quality                            of the bowel preparation was good. The ileocecal  valve, appendiceal orifice, and rectum were                            photographed. Scope In: 12:52:49 PM Scope Out: 1:04:59 PM Scope Withdrawal Time: 0 hours 9 minutes 43 seconds  Total Procedure Duration: 0 hours 12 minutes 10 seconds  Findings:                 The perianal and digital rectal examinations were                            normal.                           A 4 mm polyp was found in the ascending colon. The                            polyp was sessile. The polyp was removed with a                            cold snare. Resection and retrieval were complete.                            Estimated blood loss was minimal.                           There was evidence of a prior end-to-end                            colo-colonic anastomosis in the recto-sigmoid colon                            at 20 cm from the anal verge. This was patent and                            was characterized by healthy appearing mucosa. The                            anastomosis was  traversed.                           Non-bleeding internal hemorrhoids were found during                            retroflexion. The hemorrhoids were small. Complications:            No immediate complications. Estimated Blood Loss:     Estimated blood loss was minimal. Impression:               - One 4 mm polyp in the ascending colon, removed                            with a cold snare. Resected and retrieved.                           - Patent  end-to-end colo-colonic anastomosis,                            characterized by healthy appearing mucosa.                           - The remainder of the colon was normal appearing.                           - Non-bleeding internal hemorrhoids. Recommendation:           - Patient has a contact number available for                            emergencies. The signs and symptoms of potential                            delayed complications were discussed with the                            patient. Return to normal activities tomorrow.                            Written discharge instructions were provided to the                            patient.                           - Resume previous diet.                           - Continue present medications.                           - Await pathology results.                           - Repeat colonoscopy in 3 years for surveillance.                           - Return to GI clinic PRN. Doristine Locks, MD 03/01/2023 1:10:38 PM

## 2023-03-01 NOTE — Patient Instructions (Addendum)
-  Handout on polyps, hemorrhoid provided -await pathology results -repeat colonoscopy in 3 years for surveillance recommended. -Continue present medications  YOU HAD AN ENDOSCOPIC PROCEDURE TODAY AT THE [facility]:   Refer to the procedure report that was given to you for any specific questions about what was found during the examination.  If the procedure report does not answer your questions, please call your gastroenterologist to clarify.  If you requested that your care partner not be given the details of your procedure findings, then the procedure report has been included in a sealed envelope for you to review at your convenience later.  YOU SHOULD EXPECT: Some feelings of bloating in the abdomen. Passage of more gas than usual.  Walking can help get rid of the air that was put into your GI tract during the procedure and reduce the bloating. If you had a lower endoscopy (such as a colonoscopy or flexible sigmoidoscopy) you may notice spotting of blood in your stool or on the toilet paper. If you underwent a bowel prep for your procedure, you may not have a normal bowel movement for a few days.  Please Note:  You might notice some irritation and congestion in your nose or some drainage.  This is from the oxygen used during your procedure.  There is no need for concern and it should clear up in a day or so.  SYMPTOMS TO REPORT IMMEDIATELY:  Following lower endoscopy (colonoscopy or flexible sigmoidoscopy):  Excessive amounts of blood in the stool  Significant tenderness or worsening of abdominal pains  Swelling of the abdomen that is new, acute  Fever of 100F or higher   For urgent or emergent issues, a gastroenterologist can be reached at any hour by calling [phone]. Do not use MyChart messaging for urgent concerns.    DIET:  We do recommend a small meal at first, but then you may proceed to your regular diet.  Drink plenty of fluids but you should avoid alcoholic  beverages for 24 hours.  ACTIVITY:  You should plan to take it easy for the rest of today and you should NOT DRIVE or use heavy machinery until tomorrow (because of the sedation medicines used during the test).    FOLLOW UP: Our staff will call the number listed on your records the next business day following your procedure.  We will call around 7:15- 8:00 am to check on you and address any questions or concerns that you may have regarding the information given to you following your procedure. If we do not reach you, we will leave a message.     If any biopsies were taken you will be contacted by phone or by letter within the next 1-3 weeks.  Please call us at [phone] if you have not heard about the biopsies in 3 weeks.    SIGNATURES/CONFIDENTIALITY: You and/or your care partner have signed paperwork which will be entered into your electronic medical record.  These signatures attest to the fact that that the information above on your After Visit Summary has been reviewed and is understood.  Full responsibility of the confidentiality of this discharge information lies with you and/or your care-partner.

## 2023-03-02 ENCOUNTER — Telehealth: Payer: Self-pay | Admitting: *Deleted

## 2023-03-02 ENCOUNTER — Inpatient Hospital Stay: Payer: 59

## 2023-03-02 ENCOUNTER — Other Ambulatory Visit: Payer: Self-pay

## 2023-03-02 VITALS — BP 147/80 | HR 70 | Temp 98.9°F | Resp 16

## 2023-03-02 DIAGNOSIS — Z9049 Acquired absence of other specified parts of digestive tract: Secondary | ICD-10-CM

## 2023-03-02 DIAGNOSIS — E860 Dehydration: Secondary | ICD-10-CM

## 2023-03-02 DIAGNOSIS — D519 Vitamin B12 deficiency anemia, unspecified: Secondary | ICD-10-CM | POA: Diagnosis not present

## 2023-03-02 DIAGNOSIS — C187 Malignant neoplasm of sigmoid colon: Secondary | ICD-10-CM

## 2023-03-02 MED ORDER — CYANOCOBALAMIN 1000 MCG/ML IJ SOLN
1000.0000 ug | Freq: Once | INTRAMUSCULAR | Status: AC
  Administered 2023-03-02: 1000 ug via INTRAMUSCULAR
  Filled 2023-03-02: qty 1

## 2023-03-02 NOTE — Telephone Encounter (Signed)
  Follow up Call-     03/01/2023   11:24 AM 02/27/2023    3:14 PM 02/27/2023    3:08 PM 01/04/2022    2:17 PM 07/20/2021   10:13 AM  Call back number  Post procedure Call Back phone  # [phone]  [phone]  Permission to leave phone message Yes  Yes Yes Yes     Patient questions:  Do you have a fever, pain , or abdominal swelling? No. Pain Score  0 *  Have you tolerated food without any problems? Yes.    Have you been able to return to your normal activities? Yes.    Do you have any questions about your discharge instructions: Diet   No. Medications  No. Follow up visit  No.  Do you have questions or concerns about your Care? No.  Actions: * If pain score is 4 or above: No action needed, pain <4.

## 2023-03-09 ENCOUNTER — Other Ambulatory Visit: Payer: Self-pay

## 2023-03-09 ENCOUNTER — Inpatient Hospital Stay: Payer: 59

## 2023-03-09 DIAGNOSIS — Z9049 Acquired absence of other specified parts of digestive tract: Secondary | ICD-10-CM

## 2023-03-09 DIAGNOSIS — E860 Dehydration: Secondary | ICD-10-CM

## 2023-03-09 DIAGNOSIS — D519 Vitamin B12 deficiency anemia, unspecified: Secondary | ICD-10-CM | POA: Diagnosis not present

## 2023-03-09 DIAGNOSIS — C187 Malignant neoplasm of sigmoid colon: Secondary | ICD-10-CM

## 2023-03-09 MED ORDER — CYANOCOBALAMIN 1000 MCG/ML IJ SOLN
1000.0000 ug | Freq: Once | INTRAMUSCULAR | Status: AC
  Administered 2023-03-09: 1000 ug via INTRAMUSCULAR
  Filled 2023-03-09: qty 1

## 2023-04-04 ENCOUNTER — Ambulatory Visit (HOSPITAL_COMMUNITY)
Admission: RE | Admit: 2023-04-04 | Discharge: 2023-04-04 | Disposition: A | Discharge status: Home / Self Care | Payer: 59 | Source: Ambulatory Visit | Attending: Nurse Practitioner | Admitting: Nurse Practitioner

## 2023-04-04 DIAGNOSIS — C187 Malignant neoplasm of sigmoid colon: Secondary | ICD-10-CM | POA: Insufficient documentation

## 2023-04-04 MED ORDER — SODIUM CHLORIDE (PF) 0.9 % IJ SOLN
INTRAMUSCULAR | Status: AC
  Filled 2023-04-04: qty 50

## 2023-04-04 MED ORDER — IOHEXOL 300 MG/ML  SOLN
100.0000 mL | Freq: Once | INTRAMUSCULAR | Status: AC | PRN
  Administered 2023-04-04: 100 mL via INTRAVENOUS

## 2023-04-04 MED ORDER — IOHEXOL 9 MG/ML PO SOLN
1000.0000 mL | ORAL | Status: AC
  Administered 2023-04-04: 1000 mL via ORAL

## 2023-04-05 ENCOUNTER — Telehealth: Payer: Self-pay

## 2023-04-05 NOTE — Telephone Encounter (Signed)
Patient called and informed of recent CT scan results.  Patient verbalized an understanding and appreciation for the call.  Reviewed upcoming appointments with patient. All questions answered during call.

## 2023-04-05 NOTE — Telephone Encounter (Signed)
-----   Message from Pollyann Samples, NP sent at 04/04/2023 10:44 PM EDT ----- Please let pt know surveillance CT is negative, no evidence of colon cancer recurrence. Keep appts as is.   Thanks, Clayborn Heron, NP

## 2023-04-06 ENCOUNTER — Inpatient Hospital Stay: Payer: 59 | Attending: Hematology

## 2023-04-06 ENCOUNTER — Other Ambulatory Visit: Payer: Self-pay

## 2023-04-06 VITALS — BP 148/74 | HR 66 | Temp 98.9°F | Resp 18

## 2023-04-06 DIAGNOSIS — C187 Malignant neoplasm of sigmoid colon: Secondary | ICD-10-CM

## 2023-04-06 DIAGNOSIS — D519 Vitamin B12 deficiency anemia, unspecified: Secondary | ICD-10-CM | POA: Diagnosis not present

## 2023-04-06 DIAGNOSIS — E860 Dehydration: Secondary | ICD-10-CM

## 2023-04-06 DIAGNOSIS — Z9049 Acquired absence of other specified parts of digestive tract: Secondary | ICD-10-CM

## 2023-04-06 MED ORDER — CYANOCOBALAMIN 1000 MCG/ML IJ SOLN
1000.0000 ug | Freq: Once | INTRAMUSCULAR | Status: AC
  Administered 2023-04-06: 1000 ug via INTRAMUSCULAR
  Filled 2023-04-06: qty 1

## 2023-04-09 ENCOUNTER — Telehealth: Payer: Self-pay

## 2023-04-09 NOTE — Telephone Encounter (Addendum)
Called patient and relayed message below as per Santiago Glad NP. Patient voiced full understanding.   ----- Message from Pollyann Samples, NP sent at 04/04/2023 10:44 PM EDT ----- Please let pt know surveillance CT is negative, no evidence of colon cancer recurrence. Keep appts as is.   Thanks, Clayborn Heron, NP

## 2023-05-04 ENCOUNTER — Inpatient Hospital Stay: Payer: 59 | Attending: Hematology

## 2023-05-04 ENCOUNTER — Other Ambulatory Visit: Payer: Self-pay

## 2023-05-04 DIAGNOSIS — E860 Dehydration: Secondary | ICD-10-CM

## 2023-05-04 DIAGNOSIS — D519 Vitamin B12 deficiency anemia, unspecified: Secondary | ICD-10-CM | POA: Insufficient documentation

## 2023-05-04 DIAGNOSIS — Z9049 Acquired absence of other specified parts of digestive tract: Secondary | ICD-10-CM

## 2023-05-04 DIAGNOSIS — C187 Malignant neoplasm of sigmoid colon: Secondary | ICD-10-CM

## 2023-05-04 MED ORDER — CYANOCOBALAMIN 1000 MCG/ML IJ SOLN
1000.0000 ug | Freq: Once | INTRAMUSCULAR | Status: AC
  Administered 2023-05-04: 1000 ug via INTRAMUSCULAR

## 2023-05-23 ENCOUNTER — Encounter: Payer: Self-pay | Admitting: Hematology

## 2023-05-23 NOTE — Progress Notes (Signed)
This encounter was created in error - please disregard.

## 2023-06-01 ENCOUNTER — Telehealth: Payer: Self-pay | Admitting: Hematology

## 2023-06-01 ENCOUNTER — Inpatient Hospital Stay: Payer: 59

## 2023-06-02 ENCOUNTER — Other Ambulatory Visit: Payer: Self-pay

## 2023-06-02 ENCOUNTER — Inpatient Hospital Stay: Payer: 59 | Attending: Hematology

## 2023-06-02 VITALS — BP 147/73 | HR 57 | Temp 98.0°F

## 2023-06-02 DIAGNOSIS — D519 Vitamin B12 deficiency anemia, unspecified: Secondary | ICD-10-CM | POA: Diagnosis present

## 2023-06-02 DIAGNOSIS — Z85038 Personal history of other malignant neoplasm of large intestine: Secondary | ICD-10-CM | POA: Insufficient documentation

## 2023-06-02 DIAGNOSIS — Z9049 Acquired absence of other specified parts of digestive tract: Secondary | ICD-10-CM

## 2023-06-02 DIAGNOSIS — Z9221 Personal history of antineoplastic chemotherapy: Secondary | ICD-10-CM | POA: Insufficient documentation

## 2023-06-02 DIAGNOSIS — Z923 Personal history of irradiation: Secondary | ICD-10-CM | POA: Diagnosis not present

## 2023-06-02 DIAGNOSIS — C187 Malignant neoplasm of sigmoid colon: Secondary | ICD-10-CM

## 2023-06-02 DIAGNOSIS — E860 Dehydration: Secondary | ICD-10-CM

## 2023-06-02 MED ORDER — CYANOCOBALAMIN 1000 MCG/ML IJ SOLN
1000.0000 ug | Freq: Once | INTRAMUSCULAR | Status: AC
  Administered 2023-06-02: 1000 ug via INTRAMUSCULAR
  Filled 2023-06-02: qty 1

## 2023-06-20 ENCOUNTER — Inpatient Hospital Stay: Payer: 59

## 2023-06-20 ENCOUNTER — Inpatient Hospital Stay: Payer: 59 | Admitting: Nurse Practitioner

## 2023-06-20 ENCOUNTER — Other Ambulatory Visit: Payer: Self-pay

## 2023-06-20 VITALS — BP 140/73 | HR 58 | Temp 97.5°F | Resp 20 | Wt 196.5 lb

## 2023-06-20 DIAGNOSIS — C187 Malignant neoplasm of sigmoid colon: Secondary | ICD-10-CM | POA: Diagnosis not present

## 2023-06-20 DIAGNOSIS — D519 Vitamin B12 deficiency anemia, unspecified: Secondary | ICD-10-CM | POA: Diagnosis not present

## 2023-06-20 DIAGNOSIS — D518 Other vitamin B12 deficiency anemias: Secondary | ICD-10-CM

## 2023-06-20 LAB — CMP (CANCER CENTER ONLY)
ALT: 12 U/L (ref 0–44)
AST: 18 U/L (ref 15–41)
Albumin: 4.3 g/dL (ref 3.5–5.0)
Alkaline Phosphatase: 85 U/L (ref 38–126)
Anion gap: 6 (ref 5–15)
BUN: 13 mg/dL (ref 6–20)
CO2: 28 mmol/L (ref 22–32)
Calcium: 9.2 mg/dL (ref 8.9–10.3)
Chloride: 106 mmol/L (ref 98–111)
Creatinine: 0.99 mg/dL (ref 0.61–1.24)
GFR, Estimated: >60 mL/min (ref >60)
Glucose, Bld: 82 mg/dL (ref 70–99)
Potassium: 4.2 mmol/L (ref 3.5–5.1)
Sodium: 140 mmol/L (ref 135–145)
Total Bilirubin: 0.5 mg/dL (ref 0.3–1.2)
Total Protein: 7.7 g/dL (ref 6.5–8.1)

## 2023-06-20 LAB — CBC WITH DIFFERENTIAL (CANCER CENTER ONLY)
Abs Immature Granulocytes: 0.01 10*3/uL (ref 0.00–0.07)
Basophils Absolute: 0.1 10*3/uL (ref 0.0–0.1)
Basophils Relative: 1 %
Eosinophils Absolute: 0.1 10*3/uL (ref 0.0–0.5)
Eosinophils Relative: 3 %
HCT: 44 % (ref 39.0–52.0)
Hemoglobin: 14.6 g/dL (ref 13.0–17.0)
Immature Granulocytes: 0 %
Lymphocytes Relative: 36 %
Lymphs Abs: 1.4 10*3/uL (ref 0.7–4.0)
MCH: 29.3 pg (ref 26.0–34.0)
MCHC: 33.2 g/dL (ref 30.0–36.0)
MCV: 88.4 fL (ref 80.0–100.0)
Monocytes Absolute: 0.5 10*3/uL (ref 0.1–1.0)
Monocytes Relative: 12 %
Neutro Abs: 1.8 10*3/uL (ref 1.7–7.7)
Neutrophils Relative %: 48 %
Platelet Count: 226 10*3/uL (ref 150–400)
RBC: 4.98 MIL/uL (ref 4.22–5.81)
RDW: 14 % (ref 11.5–15.5)
WBC Count: 3.8 10*3/uL — ABNORMAL LOW (ref 4.0–10.5)
nRBC: 0 % (ref 0.0–0.2)

## 2023-06-20 LAB — CEA (ACCESS): CEA (CHCC): <1 ng/mL (ref 0.00–5.00)

## 2023-06-20 LAB — FERRITIN: Ferritin: 38 ng/mL (ref 24–336)

## 2023-06-20 LAB — VITAMIN B12: Vitamin B-12: 338 pg/mL (ref 180–914)

## 2023-06-20 NOTE — Assessment & Plan Note (Addendum)
pT3pN1bM0 stage IIIb, MMR normal, MSI-stable  -Diagnosed in November 2022, status post surgical resection by Dr. Cliffton Asters with positive radial margin. -He received 4 cycles adjuvant chemotherapy CapeOx, completed in 11/2021 -He subsequently received concurrent chemoradiation with Xeloda, completed in May 2023 -He is on cancer surveillance, doing well -CT from 09/27/2022 showed NED -colonoscopy 03/01/2023 showed single, very small polyp in ascending colon, healthy appearing mucosa of colo-colonic anastomosis, and non-bleeding, internal hemorrhoids. Repeat recommended in 2028. -CT CAP 04/04/2023 showed no evidence of recurring disease or metastases.  -repeat surveillance CT CAP in 09/2023 -labs and follow up in 6 months

## 2023-06-20 NOTE — Progress Notes (Unsigned)
Patient Care Team: Patient, No Pcp Per as PCP - General (General Practice) Shellia Cleverly, DO as Consulting Physician (Gastroenterology) Andria Meuse, MD as Consulting Physician (General Surgery) Malachy Mood, MD as Consulting Physician (Hematology) Pollyann Samples, NP as Nurse Practitioner (Nurse Practitioner)  Clinic Day:  06/20/2023  Referring physician: Malachy Mood, MD  ASSESSMENT & PLAN:   Assessment & Plan: Malignant neoplasm of sigmoid colon [facility])  pT3pN1bM0 stage IIIb, MMR normal, MSI-stable  -Diagnosed in November 2022, status post surgical resection by Dr. Cliffton Asters with positive radial margin. -He received 4 cycles adjuvant chemotherapy CapeOx, completed in 11/2021 -He subsequently received concurrent chemoradiation with Xeloda, completed in May 2023 -He is on cancer surveillance, doing well -CT from 09/27/2022 showed NED -colonoscopy 03/01/2023 showed single, very small polyp in ascending colon, healthy appearing mucosa of colo-colonic anastomosis, and non-bleeding, internal hemorrhoids. Repeat recommended in 2028. -CT CAP 04/04/2023 showed no evidence of recurring disease or metastases.  -repeat surveillance CT CAP in 09/2023 -labs and follow up in 6 months      B12 deficiency anemia 06/20/2023 - b12 level 338 and ferritin 38. Will hold b12 for now. Recheck in 4 months and treat as indicated.     Plan  Review labs which are stable.  Labs reviewed  -CBC showing WBC 3.8; Hgb 14.6; Hct 44.0; Plt 228; Anc 1.8; ferritin 38 -CMP - K 4.2; glucose 82; BUN 13; Creatinine 0.99; eGFR > 60; Ca 9.2; LFTs normal; B12 338   Review most recent CT and colonoscopy results with patient.  Plan to repeat CT scan CAP in 09/2023 Labs and follow up should be in 6 months   The patient understands the plans discussed today and is in agreement with them.  He knows to contact our office if he develops concerns prior to his next appointment.  I provided 25 minutes of face-to-face time  during this encounter and > 50% was spent counseling as documented under my assessment and plan.    Carlean Jews, NP  [facility] [address] [place] [address] Dept: [phone] Dept Fax: [phone]   Orders Placed This Encounter  Procedures   CT CHEST ABDOMEN PELVIS W CONTRAST    Standing Status:   Future    Standing Expiration Date:   06/19/2024    Order Specific Question:   If indicated for the ordered procedure, I authorize the administration of contrast media per Radiology protocol    Answer:   Yes    Order Specific Question:   Does the patient have a contrast media/X-ray dye allergy?    Answer:   No    Order Specific Question:   Preferred imaging location?    Answer:   [facility]    Order Specific Question:   If indicated for the ordered procedure, I authorize the administration of oral contrast media per Radiology protocol    Answer:   Yes      CHIEF COMPLAINT:  CC: malignant neoplasm of sigmoid colon   Current Treatment:  surveillance   INTERVAL HISTORY:  Domenick is here today for repeat clinical assessment. He denies fevers or chills. He denies pain. His appetite is good. His weight has decreased 7 pounds over last 4 months  .   pT3pN1bM0 stage IIIb, MMR normal, MSI-stable  -Diagnosed in November 2022, status post surgical resection by Dr. Cliffton Asters with positive radial margin. -He received 4 cycles adjuvant chemotherapy CapeOx, completed  in 11/2021 -He subsequently received concurrent chemoradiation with Xeloda, completed in May 2023 -He is on cancer surveillance, doing well -CT from 09/27/2022 showed NED -colonoscopy 03/01/2023 showed single, very small polyp in ascending colon, healthy appearing mucosa of colo-colonic anastomosis, and non-bleeding, internal hemorrhoids. Repeat recommended in 2028. -CT CAP 04/04/2023 showed no evidence of recurring disease or metastases.  -repeat  surveillance CT CAP in 09/2023 -labs and follow up in 6 months   I have reviewed the past medical history, past surgical history, social history and family history with the patient and they are unchanged from previous note.  ALLERGIES:  has No Known Allergies.  MEDICATIONS:  No current outpatient medications on file.   No current facility-administered medications for this visit.    HISTORY OF PRESENT ILLNESS:   Oncology History  Malignant neoplasm of sigmoid colon (HCC)  07/20/2021 Procedure   Diagnostic EGD by Dr. Barron Alvine - Normal esophagus. - Z-line regular, 40 cm from the incisors. - Normal stomach. Biopsied. - Normal examined duodenum. Biopsied. Colonoscopy - Hemorrhoids found on perianal exam. - Likely malignant completely obstructing tumor in the sigmoid colon. Biopsied. Tattooed. - Non-bleeding internal hemorrhoids.   07/20/2021 Initial Biopsy   Diagnosis 1. Duodenum, Biopsy - BENIGN DUODENAL MUCOSA - NO ACUTE INFLAMMATION, VILLOUS BLUNTING OR INCREASED INTRAEPITHELIAL LYMPHOCYTES 2. Stomach, biopsy - MILD CHRONIC GASTRITIS WITH REACTIVE CHANGES - NO H. PYLORI OR INTESTINAL METAPLASIA IDENTIFIED - SEE COMMENT 3. Sigmoid Colon Biopsy - ADENOCARCINOMA - SEE COMMENT Microscopic Comment 2. H. pylori immunohistochemistry is NEGATIVE for microorganisms.   07/20/2021 Tumor Marker   CEA normal 3.2   07/28/2021 Imaging   Staging CT CAP IMPRESSION: 1. Circumferential wall thickening of the distal sigmoid colon, in keeping with primary colon malignancy identified by colonoscopy. 2. There is some nodularity in the adjacent mesocolon about the inferior mesenteric artery, as well as prominent subcentimeter lymph nodes, worrisome for early nodal or soft tissue metastatic disease. 3. No evidence of distant metastatic disease in the chest, abdomen, or pelvis.   08/31/2021 Cancer Staging   Staging form: Colon and Rectum, AJCC 8th Edition - Pathologic stage from 08/31/2021:  Stage IIIB (pT3, pN1b, cM0) - Signed by Pollyann Samples, NP on 09/19/2021 Stage prefix: Initial diagnosis Histologic grading system: 4 grade system Histologic grade (G): G2 Laterality: Left Lymph-vascular invasion (LVI): LVI present/identified, NOS Carcinoembryonic antigen (CEA) (ng/mL): 3.2 Perineural invasion (PNI): Absent   08/31/2021 Definitive Surgery   PREOP DIAGNOSIS: sigmoid colon cancer POSTOP DIAGNOSIS: Rectosigmoid colon cancer PROCEDURE:  Robotic assisted low anterior resection with double stapled colorectal anastomosis Intraoperative assessment of perfusion (ICG) Flexible sigmoidoscopy Bilateral transversus abdominus plane blocks SURGEON: Stephanie Coup. Cliffton Asters, MD ASSISTANT: Romie Levee, MD   08/31/2021 Pathology Results   FINAL MICROSCOPIC DIAGNOSIS: A. COLON, RECTOSIGMOID, RESECTION: - Invasive moderately differentiated adenocarcinoma, 4 cm, involving distal sigmoid colon - Carcinoma invades into subserosa - Inked radial margin is positive for carcinoma - Metastatic carcinoma to two of twenty-two lymph nodes (2/22) - See oncology table B. FINAL DISTAL MARGIN: - Colonic donut, negative for carcinoma pT3pN1b, stage IIIb MMR IHC normal MSI-stable   09/19/2021 Initial Diagnosis   Malignant neoplasm of sigmoid colon (HCC)   09/30/2021 - 12/02/2021 Chemotherapy   Patient is on Treatment Plan : COLORECTAL Xelox (Capeox) q21d     01/04/2022 Procedure   Colonoscopy, Dr. Barron Alvine  Impression: - One 5 mm polyp in the proximal transverse colon, removed with a cold snare. Resected and retrieved. - Patent end-to-end low-anterior anastomosis, located 20 cm from  the anal verge, characterized by healthy appearing mucosa. Biopsied. - One 3 mm polyp in the rectosigmoid, removed with a cold snare. Resected and retrieved. - Non-bleeding internal hemorrhoids. - Stool in the entire examined colon.   01/04/2022 Pathology Results   Diagnosis 1. Surgical [P], colon, transverse,  polyp (1) - TUBULAR ADENOMA WITHOUT HIGH-GRADE DYSPLASIA. 2. Surgical [P], colon, rectum - COLONIC MUCOSA WITH CHANGES CONSISTENT WITH MUCOSAL PROLAPSE. - NO ADENOMATOUS CHANGE OR MALIGNANCY. 3. Surgical [P], colon, rectum, polyp (1) - HYPERPLASTIC POLYP (S).    Imaging     09/28/2022 Imaging    IMPRESSION: 1. Post sigmoid resection. No evidence of recurrent or metastatic disease in the chest, abdomen or pelvis. 2. Aortic atherosclerosis.   03/01/2023 Procedure   Colonoscopy -single 4 mm polyp in ascending colon  -patent, end-to-end colo-colonic anastomosis with healthy appearing mucosa  -non-bleeding, internal hemorrhoids.  -recommend repeat colonoscopy in 3 years    04/04/2023 Imaging   CT chest, abdomen, and pelvis with contrast  IMPRESSION: 1. Sigmoid colon resection and reanastomosis. 2. No evidence of recurrent or metastatic disease in the chest, abdomen, or pelvis. 3. No CT findings to explain chest pain. 4. Mild prostatomegaly.       REVIEW OF SYSTEMS:   Constitutional: Denies fevers, chills or abnormal weight loss Eyes: Denies blurriness of vision Ears, nose, mouth, throat, and face: Denies mucositis or sore throat Respiratory: Denies cough, dyspnea or wheezes Cardiovascular: Denies palpitation, chest discomfort or lower extremity swelling. Continues to have sternal discomfort when moving in certain directions. Pain comes on suddenly and can last for a few hours.  Gastrointestinal:  Denies nausea, heartburn or change in bowel habits Skin: Denies abnormal skin rashes Lymphatics: Denies new lymphadenopathy or easy bruising Neurological:Denies numbness, tingling or new weaknesses Behavioral/Psych: Mood is stable, no new changes  All other systems were reviewed with the patient and are negative.   VITALS:   Today's Vitals   06/20/23 1148 06/20/23 1156  BP: (!) 140/73   Pulse: (!) 58   Resp: 20   Temp: (!) 97.5 F (36.4 C)   SpO2: 99%   Weight: 196 lb 8 oz  (89.1 kg)   PainSc:  0-No pain   Body mass index is 25.93 kg/m.   Wt Readings from Last 3 Encounters:  06/20/23 196 lb 8 oz (89.1 kg)  03/01/23 203 lb (92.1 kg)  02/27/23 203 lb (92.1 kg)    Body mass index is 25.93 kg/m.  Performance status (ECOG): 0 - Asymptomatic  PHYSICAL EXAM:   GENERAL:alert, no distress and comfortable SKIN: skin color, texture, turgor are normal, no rashes or significant lesions EYES: normal, Conjunctiva are pink and non-injected, sclera clear OROPHARYNX:no exudate, no erythema and lips, buccal mucosa, and tongue normal  NECK: supple, thyroid normal size, non-tender, without nodularity LYMPH:  no palpable lymphadenopathy in the cervical, axillary or inguinal LUNGS: clear to auscultation and percussion with normal breathing effort HEART: regular rate & rhythm and no murmurs and no lower extremity edema ABDOMEN:abdomen soft, non-tender and normal bowel sounds Musculoskeletal:no cyanosis of digits and no clubbing  NEURO: alert & oriented x 3 with fluent speech, no focal motor/sensory deficits  LABORATORY DATA:  I have reviewed the data as listed    Component Value Date/Time   NA 140 06/20/2023 1113   K 4.2 06/20/2023 1113   CL 106 06/20/2023 1113   CO2 28 06/20/2023 1113   GLUCOSE 82 06/20/2023 1113   BUN 13 06/20/2023 1113   CREATININE 0.99 06/20/2023  1113   CREATININE 0.92 07/15/2021 1411   CALCIUM 9.2 06/20/2023 1113   PROT 7.7 06/20/2023 1113   ALBUMIN 4.3 06/20/2023 1113   AST 18 06/20/2023 1113   ALT 12 06/20/2023 1113   ALKPHOS 85 06/20/2023 1113   BILITOT 0.5 06/20/2023 1113   GFRNONAA >60 06/20/2023 1113    Lab Results  Component Value Date   WBC 3.8 (L) 06/20/2023   NEUTROABS 1.8 06/20/2023   HGB 14.6 06/20/2023   HCT 44.0 06/20/2023   MCV 88.4 06/20/2023   PLT 226 06/20/2023   ADDENDUM I have seen the patient, examined him. I agree with the assessment and and plan and have edited the notes.   Mr. Fladung is doing well  overall.  Recent CT scan in June 2024 and colonoscopy showed no evidence of recurrent cancer.  He is clinically doing well, will continue monitoring.  Plan to repeat surveillance CT scan in December 2024.  Will continue B12 injection monthly, B12 level from today is pending.  All questions were answered.  I spent a total of 20 minutes for his visit today, more than 50% time on face-to-face counseling.  Malachy Mood MD 06/20/2023

## 2023-06-20 NOTE — Assessment & Plan Note (Signed)
06/20/2023 - b12 level 338 and ferritin 38. Will hold b12 for now. Recheck in 4 months and treat as indicated.

## 2023-06-21 ENCOUNTER — Encounter: Payer: Self-pay | Admitting: Hematology

## 2023-08-18 IMAGING — CT CT CHEST-ABD-PELV W/ CM
3 of 5 series · 14 of 36 positions shown, 16 images · IV contrast (OMNIPAQUE)
Comparison: None.

CLINICAL DATA: New diagnosis colon cancer, mass identified by
colonoscopy, metastatic disease evaluation

EXAM:
CT CHEST, ABDOMEN, AND PELVIS WITH CONTRAST
TECHNIQUE: Multidetector CT imaging of the chest, abdomen and pelvis was
performed following the standard protocol during bolus
administration of intravenous contrast.
CONTRAST:  80mL OMNIPAQUE IOHEXOL 350 MG/ML SOLN

[Series 2: cap with · axial · 0.83mm/px · z∈[+1036,+1576]mm · 10 of 134 slices shown, 12 images]
[im 13/134  mediastinal]
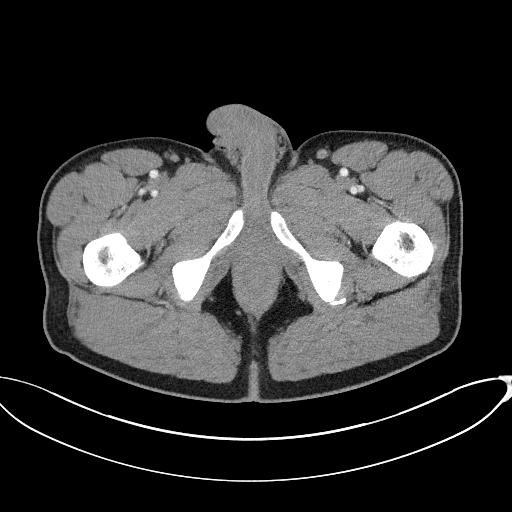
[im 13/134  bone]
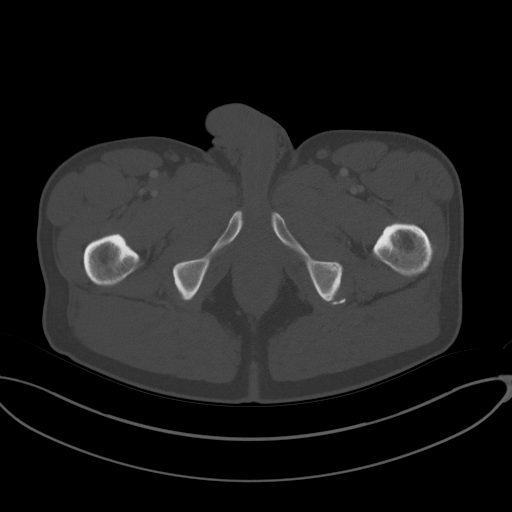
[im 25/134  mediastinal]
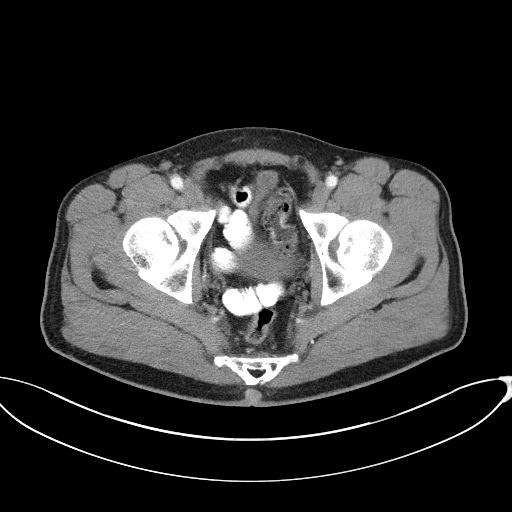
[im 37/134  mediastinal]
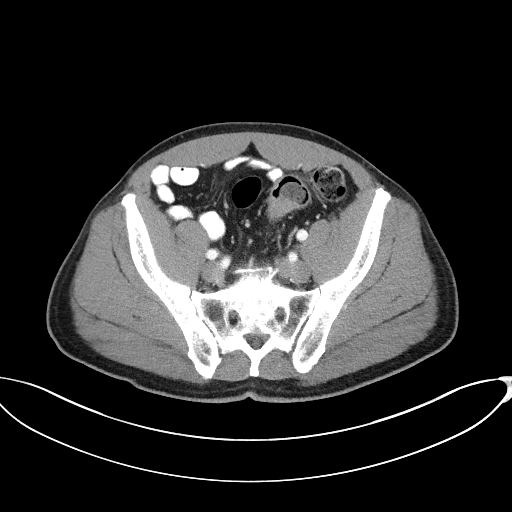
[im 49/134  mediastinal]
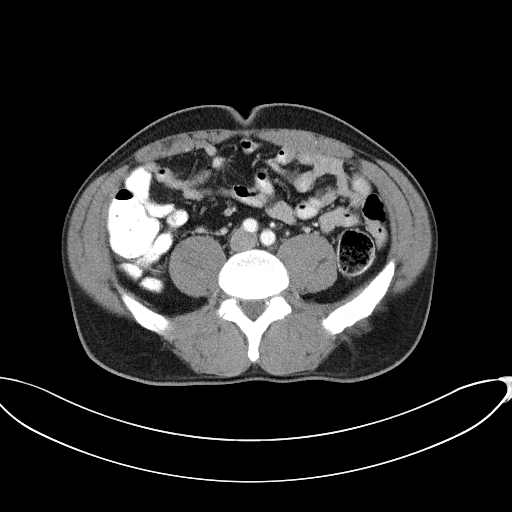
[im 61/134  mediastinal]
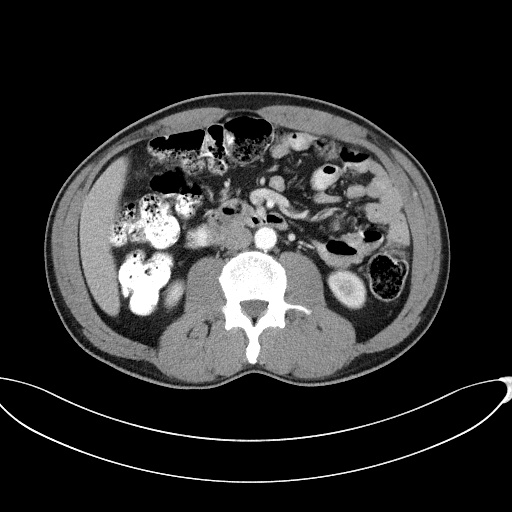
[im 73/134  mediastinal]
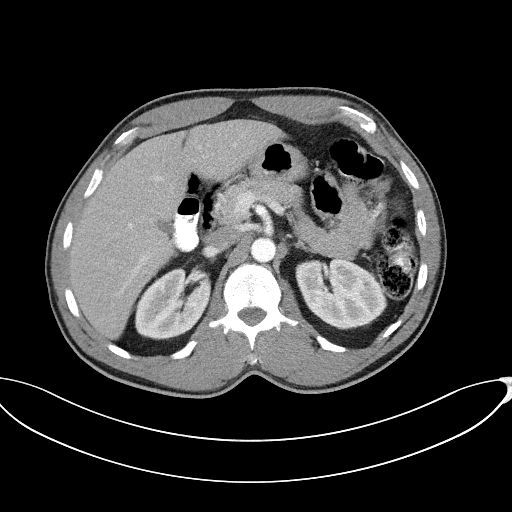
[im 85/134  mediastinal]
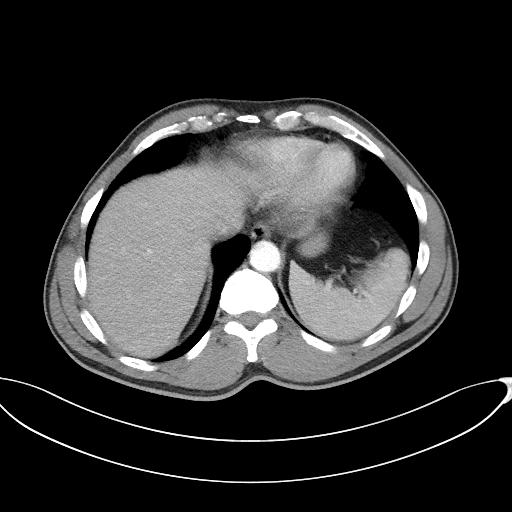
[im 97/134  mediastinal]
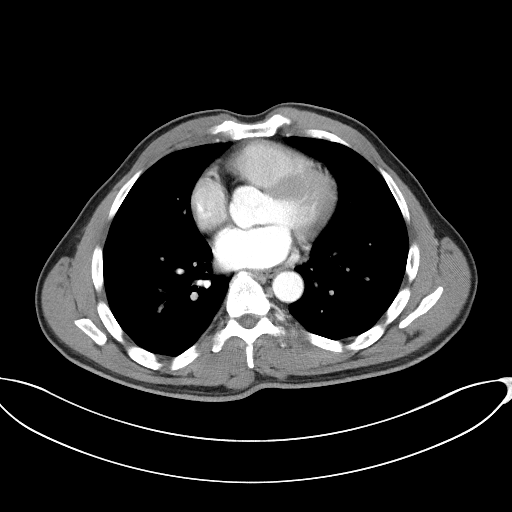
[im 109/134  mediastinal]
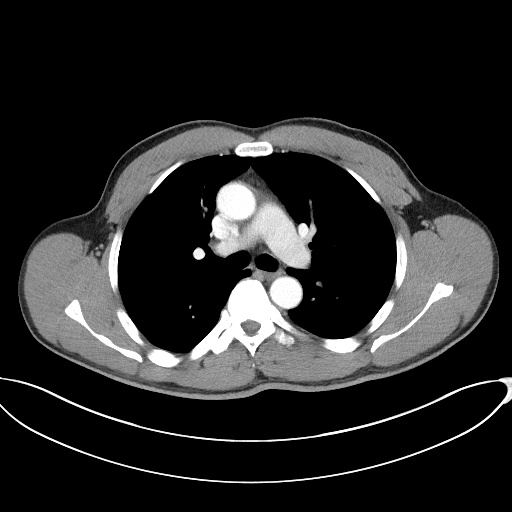
[im 109/134  bone]
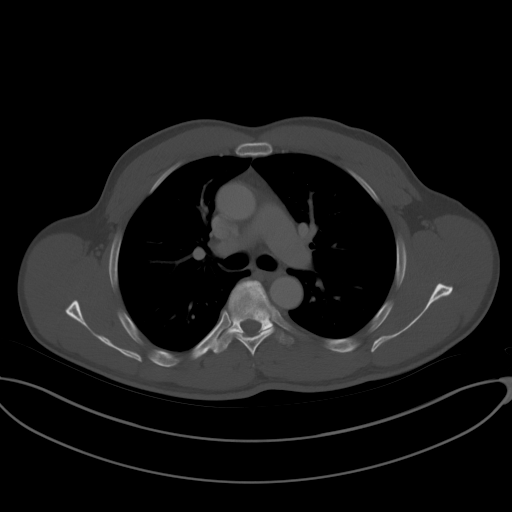
[im 121/134  mediastinal]
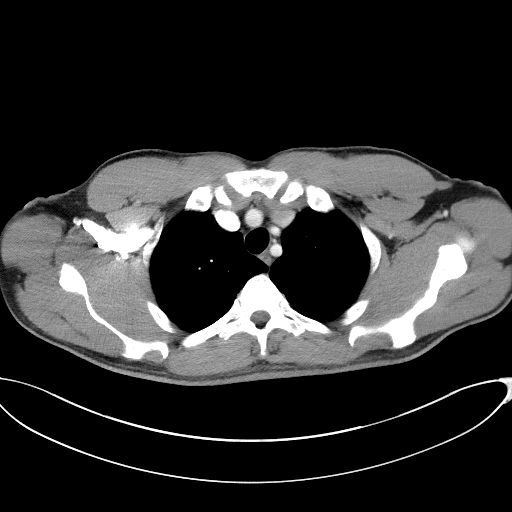

[Series 5: coronals · coronal · 0.82mm/px · 3 of 137 slices shown]
[im 28/137  mediastinal]
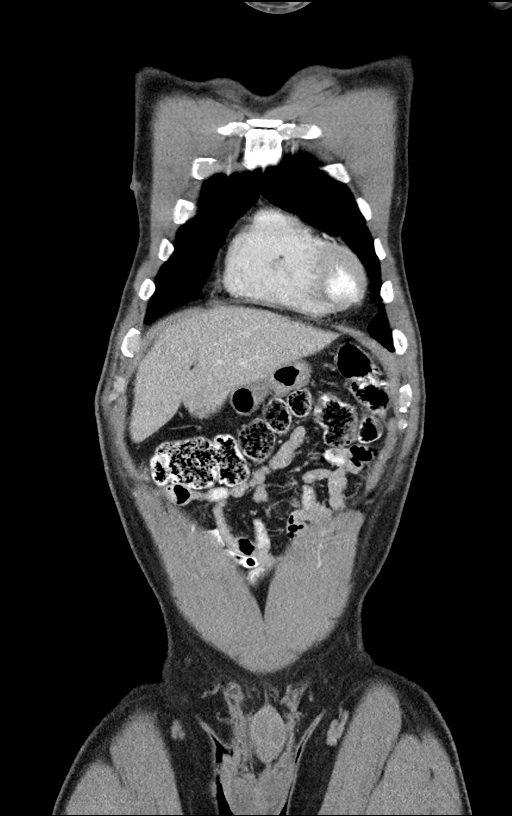
[im 55/137  mediastinal]
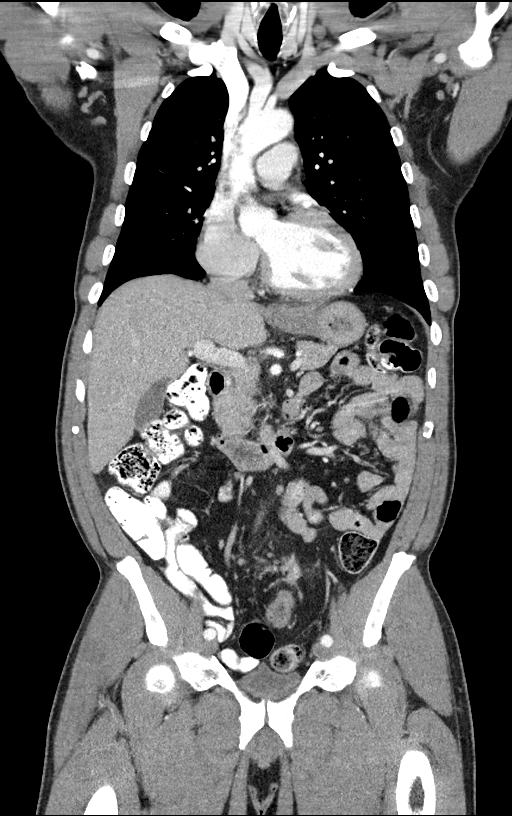
[im 82/137  mediastinal]
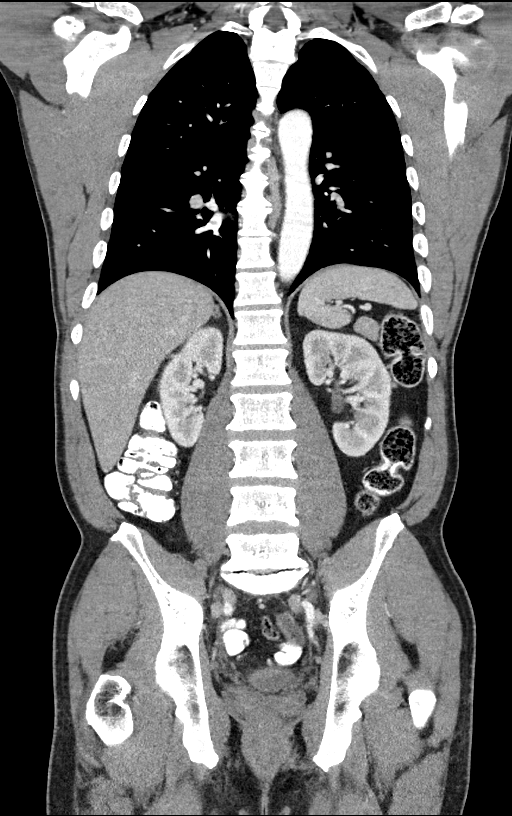

[Series 7: lung · axial · 0.83mm/px · 1 of 140 slices shown]
[im 12/140  bone]
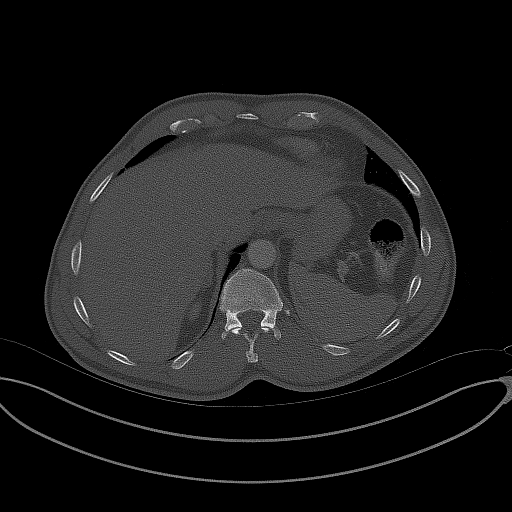

[14 of 36 positions shown; findings below may reference images not displayed]

FINDINGS: CT CHEST FINDINGS

Cardiovascular: No significant vascular findings. Normal heart size.
No pericardial effusion.

Mediastinum/Nodes: No enlarged mediastinal, hilar, or axillary lymph
nodes. Thyroid gland, trachea, and esophagus demonstrate no
significant findings.

Lungs/Pleura: Lungs are clear. No pleural effusion or pneumothorax.

Musculoskeletal: No chest wall mass or suspicious bone lesions
identified.

CT ABDOMEN PELVIS FINDINGS

Hepatobiliary: No solid liver abnormality is seen. No gallstones,
gallbladder wall thickening, or biliary dilatation.

Pancreas: Unremarkable. No pancreatic ductal dilatation or
surrounding inflammatory changes.

Spleen: Normal in size without significant abnormality.

Adrenals/Urinary Tract: Adrenal glands are unremarkable. Kidneys are
normal, without renal calculi, solid lesion, or hydronephrosis.
Bladder is unremarkable.

Stomach/Bowel: Stomach is within normal limits. Appendix appears
normal. Circumferential wall thickening of the distal sigmoid colon,
which is very redundant (series 2, image 92, series 5, image 49).
There is some nodularity in the adjacent mesocolon about the
inferior mesenteric artery measuring up to 1.8 x 1.0 cm, as well as
prominent subcentimeter lymph nodes (series 2, image 93).

Vascular/Lymphatic: No significant vascular findings are present. No
enlarged abdominal or pelvic lymph nodes.

Reproductive: Mild prostatomegaly.

Other: No abdominal wall hernia or abnormality. No abdominopelvic
ascites.

Musculoskeletal: No acute or significant osseous findings.
IMPRESSION: 1. Circumferential wall thickening of the distal sigmoid colon, in
keeping with primary colon malignancy identified by colonoscopy.
2. There is some nodularity in the adjacent mesocolon about the
inferior mesenteric artery, as well as prominent subcentimeter lymph
nodes, worrisome for early nodal or soft tissue metastatic disease.
3. No evidence of distant metastatic disease in the chest, abdomen,
or pelvis.

## 2023-10-19 ENCOUNTER — Inpatient Hospital Stay: Payer: 59 | Attending: Hematology | Admitting: Nurse Practitioner

## 2023-10-19 ENCOUNTER — Ambulatory Visit (HOSPITAL_COMMUNITY)
Admission: RE | Admit: 2023-10-19 | Discharge: 2023-10-19 | Disposition: A | Discharge status: Home / Self Care | Payer: 59 | Source: Ambulatory Visit | Attending: Nurse Practitioner | Admitting: Nurse Practitioner

## 2023-10-19 DIAGNOSIS — C187 Malignant neoplasm of sigmoid colon: Secondary | ICD-10-CM | POA: Insufficient documentation

## 2023-10-19 DIAGNOSIS — I700 Atherosclerosis of aorta: Secondary | ICD-10-CM | POA: Diagnosis not present

## 2023-10-19 DIAGNOSIS — Z9221 Personal history of antineoplastic chemotherapy: Secondary | ICD-10-CM | POA: Insufficient documentation

## 2023-10-19 DIAGNOSIS — Z923 Personal history of irradiation: Secondary | ICD-10-CM | POA: Diagnosis not present

## 2023-10-19 LAB — CMP (CANCER CENTER ONLY)
ALT: 11 U/L (ref 0–44)
AST: 17 U/L (ref 15–41)
Albumin: 4.2 g/dL (ref 3.5–5.0)
Alkaline Phosphatase: 86 U/L (ref 38–126)
Anion gap: 7 (ref 5–15)
BUN: 10 mg/dL (ref 6–20)
CO2: 28 mmol/L (ref 22–32)
Calcium: 9.3 mg/dL (ref 8.9–10.3)
Chloride: 106 mmol/L (ref 98–111)
Creatinine: 0.9 mg/dL (ref 0.61–1.24)
GFR, Estimated: >60 mL/min (ref >60)
Glucose, Bld: 105 mg/dL — ABNORMAL HIGH (ref 70–99)
Potassium: 4 mmol/L (ref 3.5–5.1)
Sodium: 141 mmol/L (ref 135–145)
Total Bilirubin: 0.5 mg/dL (ref <1.2)
Total Protein: 7.7 g/dL (ref 6.5–8.1)

## 2023-10-19 LAB — CBC WITH DIFFERENTIAL (CANCER CENTER ONLY)
Abs Immature Granulocytes: 0.01 10*3/uL (ref 0.00–0.07)
Basophils Absolute: 0.1 10*3/uL (ref 0.0–0.1)
Basophils Relative: 1 %
Eosinophils Absolute: 0.1 10*3/uL (ref 0.0–0.5)
Eosinophils Relative: 3 %
HCT: 44.3 % (ref 39.0–52.0)
Hemoglobin: 14.8 g/dL (ref 13.0–17.0)
Immature Granulocytes: 0 %
Lymphocytes Relative: 35 %
Lymphs Abs: 1.3 10*3/uL (ref 0.7–4.0)
MCH: 29.7 pg (ref 26.0–34.0)
MCHC: 33.4 g/dL (ref 30.0–36.0)
MCV: 88.8 fL (ref 80.0–100.0)
Monocytes Absolute: 0.6 10*3/uL (ref 0.1–1.0)
Monocytes Relative: 15 %
Neutro Abs: 1.8 10*3/uL (ref 1.7–7.7)
Neutrophils Relative %: 46 %
Platelet Count: 217 10*3/uL (ref 150–400)
RBC: 4.99 MIL/uL (ref 4.22–5.81)
RDW: 13.3 % (ref 11.5–15.5)
WBC Count: 3.9 10*3/uL — ABNORMAL LOW (ref 4.0–10.5)
nRBC: 0 % (ref 0.0–0.2)

## 2023-10-19 LAB — VITAMIN B12: Vitamin B-12: 193 pg/mL (ref 180–914)

## 2023-10-19 LAB — FERRITIN: Ferritin: 23 ng/mL — ABNORMAL LOW (ref 24–336)

## 2023-10-19 LAB — CEA (ACCESS): CEA (CHCC): <1 ng/mL (ref 0.00–5.00)

## 2023-10-19 MED ORDER — IOHEXOL 9 MG/ML PO SOLN: 1000.0000 mL | Freq: Once | ORAL | Status: DC

## 2023-10-19 MED ORDER — IOHEXOL 300 MG/ML  SOLN
100.0000 mL | Freq: Once | INTRAMUSCULAR | Status: AC | PRN
  Administered 2023-10-19: 100 mL via INTRAVENOUS

## 2023-10-19 NOTE — Progress Notes (Signed)
CT CAP 10/19/2023. Appointment with Dr Mosetta Putt 10/31/2023 to review results

## 2023-10-22 ENCOUNTER — Telehealth: Payer: Self-pay | Admitting: Hematology

## 2023-10-22 ENCOUNTER — Other Ambulatory Visit: Payer: 59

## 2023-10-29 ENCOUNTER — Inpatient Hospital Stay: Payer: 59 | Admitting: Hematology

## 2023-10-29 ENCOUNTER — Encounter: Payer: Self-pay | Admitting: Hematology

## 2023-10-29 NOTE — Assessment & Plan Note (Signed)
 pT3pN1bM0 stage IIIb, MMR normal, MSI-stable  -Diagnosed in November 2022, status post surgical resection by Dr. Camilo Cella with positive radial margin. -He received 4 cycles adjuvant chemotherapy CapeOx, completed in 11/2021 -He subsequently received concurrent chemoradiation with Xeloda , completed in May 2023 -He is on cancer surveillance, doing well -CT from 10/19/2023 showed NED

## 2023-10-29 NOTE — Assessment & Plan Note (Signed)
-  he did not tolerate oral B12, but he admits he took 1000mg  BID. We again discussed B12 injections to help improve his neuropathy in his feet. After lengthy discussion, he agrees to try B12 injections if level still low.  -repeated B12 level on 09/27/2022 was still low 176, He received B12 injection intermittently

## 2023-10-31 ENCOUNTER — Inpatient Hospital Stay: Payer: 59 | Attending: Hematology | Admitting: Hematology

## 2023-10-31 ENCOUNTER — Inpatient Hospital Stay: Payer: 59

## 2023-10-31 ENCOUNTER — Encounter: Payer: Self-pay | Admitting: Hematology

## 2023-10-31 VITALS — BP 134/87 | HR 68 | Temp 97.3°F | Resp 18 | Wt 201.5 lb

## 2023-10-31 DIAGNOSIS — D518 Other vitamin B12 deficiency anemias: Secondary | ICD-10-CM | POA: Diagnosis not present

## 2023-10-31 DIAGNOSIS — C187 Malignant neoplasm of sigmoid colon: Secondary | ICD-10-CM

## 2023-10-31 DIAGNOSIS — N400 Enlarged prostate without lower urinary tract symptoms: Secondary | ICD-10-CM | POA: Diagnosis not present

## 2023-10-31 DIAGNOSIS — Z125 Encounter for screening for malignant neoplasm of prostate: Secondary | ICD-10-CM

## 2023-10-31 DIAGNOSIS — Z85038 Personal history of other malignant neoplasm of large intestine: Secondary | ICD-10-CM | POA: Diagnosis not present

## 2023-10-31 DIAGNOSIS — Z9049 Acquired absence of other specified parts of digestive tract: Secondary | ICD-10-CM

## 2023-10-31 DIAGNOSIS — E860 Dehydration: Secondary | ICD-10-CM

## 2023-10-31 DIAGNOSIS — K5909 Other constipation: Secondary | ICD-10-CM | POA: Insufficient documentation

## 2023-10-31 DIAGNOSIS — D519 Vitamin B12 deficiency anemia, unspecified: Secondary | ICD-10-CM | POA: Insufficient documentation

## 2023-10-31 LAB — CMP (CANCER CENTER ONLY)
ALT: 12 U/L (ref 0–44)
AST: 17 U/L (ref 15–41)
Albumin: 4.1 g/dL (ref 3.5–5.0)
Alkaline Phosphatase: 89 U/L (ref 38–126)
Anion gap: 6 (ref 5–15)
BUN: 11 mg/dL (ref 6–20)
CO2: 30 mmol/L (ref 22–32)
Calcium: 9.3 mg/dL (ref 8.9–10.3)
Chloride: 105 mmol/L (ref 98–111)
Creatinine: 0.99 mg/dL (ref 0.61–1.24)
GFR, Estimated: >60 mL/min (ref >60)
Glucose, Bld: 82 mg/dL (ref 70–99)
Potassium: 3.9 mmol/L (ref 3.5–5.1)
Sodium: 141 mmol/L (ref 135–145)
Total Bilirubin: 0.6 mg/dL (ref 0.0–1.2)
Total Protein: 7.4 g/dL (ref 6.5–8.1)

## 2023-10-31 LAB — CBC WITH DIFFERENTIAL (CANCER CENTER ONLY)
Abs Immature Granulocytes: 0.01 10*3/uL (ref 0.00–0.07)
Basophils Absolute: 0 10*3/uL (ref 0.0–0.1)
Basophils Relative: 1 %
Eosinophils Absolute: 0.1 10*3/uL (ref 0.0–0.5)
Eosinophils Relative: 3 %
HCT: 45.1 % (ref 39.0–52.0)
Hemoglobin: 14.9 g/dL (ref 13.0–17.0)
Immature Granulocytes: 0 %
Lymphocytes Relative: 24 %
Lymphs Abs: 1.2 10*3/uL (ref 0.7–4.0)
MCH: 29.6 pg (ref 26.0–34.0)
MCHC: 33 g/dL (ref 30.0–36.0)
MCV: 89.7 fL (ref 80.0–100.0)
Monocytes Absolute: 0.7 10*3/uL (ref 0.1–1.0)
Monocytes Relative: 14 %
Neutro Abs: 2.9 10*3/uL (ref 1.7–7.7)
Neutrophils Relative %: 58 %
Platelet Count: 219 10*3/uL (ref 150–400)
RBC: 5.03 MIL/uL (ref 4.22–5.81)
RDW: 13.3 % (ref 11.5–15.5)
WBC Count: 4.9 10*3/uL (ref 4.0–10.5)
nRBC: 0 % (ref 0.0–0.2)

## 2023-10-31 MED ORDER — CYANOCOBALAMIN 1000 MCG/ML IJ SOLN
1000.0000 ug | Freq: Once | INTRAMUSCULAR | Status: AC
  Administered 2023-10-31: 1000 ug via INTRAMUSCULAR
  Filled 2023-10-31: qty 1

## 2023-10-31 NOTE — Patient Instructions (Signed)
 Vitamin B12 Deficiency Vitamin B12 deficiency means that your body does not have enough vitamin B12. The body needs this important vitamin: To make red blood cells. To make genes (DNA). To help the nerves work. If you do not have enough vitamin B12 in your body, you can have health problems, such as not having enough red blood cells in the blood (anemia). What are the causes? Not eating enough foods that contain vitamin B12. Not being able to take in (absorb) vitamin B12 from the food that you eat. Certain diseases. A condition in which the body does not make enough of a certain protein. This results in your body not taking in enough vitamin B12. Having a surgery in which part of the stomach or small intestine is taken out. Taking medicines that make it hard for the body to take in vitamin B12. These include: Heartburn medicines. Some medicines that are used to treat diabetes. What increases the risk? Being an older adult. Eating a vegetarian or vegan diet that does not include any foods that come from animals. Not eating enough foods that contain vitamin B12 while you are pregnant. Taking certain medicines. Having alcoholism. What are the signs or symptoms? In some cases, there are no symptoms. If the condition leads to too few blood cells or nerve damage, symptoms can occur, such as: Feeling weak or tired. Not being hungry. Losing feeling (numbness) or tingling in your hands and feet. Redness and burning of the tongue. Feeling sad (depressed). Confusion or memory problems. Trouble walking. If anemia is very bad, symptoms can include: Being short of breath. Being dizzy. Having a very fast heartbeat. How is this treated? Changing the way you eat and drink, such as: Eating more foods that contain vitamin B12. Drinking little or no alcohol. Getting vitamin B12 shots. Taking vitamin B12 supplements by mouth (orally). Your doctor will tell you the dose that is best for you. Follow  these instructions at home: Eating and drinking  Eat foods that come from animals and have a lot of vitamin B12 in them. These include: Meats and poultry. This includes beef, pork, chicken, turkey, and organ meats, such as liver. Seafood, such as clams, rainbow trout, salmon, tuna, and haddock. Eggs. Dairy foods such as milk, yogurt, and cheese. Eat breakfast cereals that have vitamin B12 added to them (are fortified). Check the label. The items listed above may not be a complete list of foods and beverages you can eat and drink. Contact a dietitian for more information. Alcohol use Do not drink alcohol if: Your doctor tells you not to drink. You are pregnant, may be pregnant, or are planning to become pregnant. If you drink alcohol: Limit how much you have to: 0-1 drink a day for women. 0-2 drinks a day for men. Know how much alcohol is in your drink. In the [place] one drink equals one 12 oz bottle of beer (355 mL), one 5 oz glass of wine (148 mL), or one 1 oz glass of hard liquor (44 mL). General instructions Get any vitamin B12 shots if told by your doctor. Take supplements only as told by your doctor. Follow the directions. Keep all follow-up visits. Contact a doctor if: Your symptoms come back. Your symptoms get worse or do not get better with treatment. Get help right away if: You have trouble breathing. You have a very fast heartbeat. You have chest pain. You get dizzy. You faint. These symptoms may be an emergency. Get help right away. Call 911.  Do not wait to see if the symptoms will go away. Do not drive yourself to the hospital. Summary Vitamin B12 deficiency means that your body is not getting enough of the vitamin. In some cases, there are no symptoms of this condition. Treatment may include making a change in the way you eat and drink, getting shots, or taking supplements. Eat foods that have vitamin B12 in them. This information is not intended to replace advice  given to you by your health care provider. Make sure you discuss any questions you have with your health care provider. Document Revised: 06/03/2021 Document Reviewed: 06/03/2021 Elsevier Patient Education  2024 [employer] ??? ??????? ?12 Vitamin B12 Deficiency ???? ??? ??????? ?12 ??? ???? ?? ???? ?? ??? ????????? ?? ?????. ?????? ????? ??? ????????? ????:  ????? ????? ???? ???????.  ????? ????? ??????. ?????? ?????? ?? ?????? ???????? ???? ???????.  ?????? ??????? ?? ???? ??????? ??? ????? ???? ??? ??????? ?? ???? ??? ???? ????? ?????. ???? ??????? ?12 ???? ?? ???? ?????? ????? ??? ??? ???? ?? ???? ?? ????? ???? ??????? ?? ???? (??? ???? ?? ????????). ????? ?? ???? ??? ??? ??????? ?? ???? ??? ?????. ?? ????? ??? ??????? ???? ???? ??? ?????? ????? ??:  ??? ????? ?? ???? ?? ??????? ???? ????? ??? ??????? ?12.  ??? ???? ?? ???? ?? ??? ?????? ?????? ????? ??????? ??????? ?12 ??? ????? ?? ?????? ???? ???????.  ??????? ???? ????? ?????? ?????? ???? ???? ??? ????? ?????? ??????? ?12. ??? ??? ??????? ??? ???? ??????? ????????? ?????? ??????? ??????.  ??????? ???? ?? ???????? ??????? ??????? ???? ?? ???? ??? ????? ?? ???? ?? ?????? (?????? ???????) ?? ??????? ??? ???? ??? ??? ?????? ??????? ?12 ??? ?????.  ?????? ?????? ???????? ??? ?? ?????? ?? ??????? ???????.  ????? ??? ??????? ???? ???? ??? ????? ?????? ??????? ?12. ????? ???: ? ??????? ??????? ????? ?? ?????? ??? ?????? ??????? ??????? ???? ????????. ? ??? ????? ???? ??? ??????. ?? ????? ????? ?????? ??????? ??????? ?? ???? ?? ???????? ?????? ???? ??????? ?12:  ?? ???? ?????? ?????? ?????.  ????? ???? ????? ????? ???? ?? ???? (?? ???? ???????? ?? ????? ???????? ??? ?????? ??? ??? ??????) ?? ????? ?? ????? ???? ?? ?????????.  ????? ???? ????? ??? ??? ?? ????? ?????.  ????? ????? ?????.  ????? ??????. ?? ?????? ??? ?????? ?? ???????? ?? ??? ???????? ?? ???? ???? ?????? ?? ????? ???????. ??? ??? ??? ??? ?????? ?????? ??? ??? ???? ?? ???  ???????? ????? ???? ????? ??????? ???:  ????? ??????.  ????? (?????).  ????? ??????.  ????? ?? ??? ?? ?????? ????????.  ?????? ????? ???????.  ???????? ?? ???? ????? ?? ?????? ???????.  ?????? ?? ?????. ??? ??? ??? ???? ??? ???? ?????? ??? ???? ???????:  ??? ??????.  ??????.  ???? ????? ?????. ??? ????? ??? ??????? ???? ????? ??? ?????? ?????? ????? ?? ????? ????? ??????? ?12 ?? ????. ????? ???? ????? ????? ????:  ?????? ?????? ???? ????? ????? ?????? ???? (???? ?? ?????? ?? ?? ???? ???????? CBC).  ????? ?? ????? ?????? ???????.  ????? ??????? ??? ????? ???? ???? ??????? ?????? ???? ?????? ?? ??????? (????? ?????). ????? ????? ?????? ??? ??? ??????? ?12 ????? ???? ????. ??? ?????? ??? ??????? ????? ???? ??? ?????? ??? ?????. ???? ???? ??? ?????? ????????? ???????:  ????? ????? ????? ??????? ???: ? ????? ?? ????? ?? ??????? ???? ????? ??? ??????? ?12. ? ???? ?? ????? ?????? ?? ??????? ???.  ?????? ??? ??? ??????? ?12.  ????? ?????? ??????? ?  12 ?? ???? ????. ?????? ?? ????? ??????? ?????? ?????? ??????. ???? ??? ????????? ?? ??????: ????? ??????   ????? ??????? ??? ????? ???????? ????? ????? ??? ?????? ?? ??????? ?12. ????? ???: ? ?????? ????????. ????? ??? ??????? ????? ????? ??????? ???? ??????? (??? ??????? ???)? ???????? ?????? ??????? ????? ????? ????????? ???????? ??? ?????. ? ????????? ???????. ????? ??????? ????? ??? ???? ?????????? ???????? ????? ??????? ??????. ? ????? ([place]). ? ?????? ??????? ??????? ???????? ??????.  ????? ??????? ?????? ????? ??????? ?12 (?? ??????? ??)? ??? ???? ??????? ??????? ?????. ??????? ?? ?????? ???? ???? ????????? ?? ??? ???? ?????? ??? ??????. ??????? ???????? ????? ?? ?? ???? ??????? ??????? ?? ????????? ?????????? ???? ????? ???????. ??? ????? ??????? ??????? ?????? ?????? ?? ?????????. ????? ?????????  ?? ?????? ????????? ??????? ?? ??????? ???????: ? ??? ?????? ???? ??????? ?????? ??????? ?????? ???? ????? ?????????. ? ??? ???? ???????  ?? ?????? ?????? ?? ??????? ???? ?? ?????? ??????.  ??????? ?????? ????????? ????????: ? ??? ???? ??????? ???? ???????? ??? ????? ??????: ? ????? ???? ??? ???? ??????. ? ??????? ??? ???? ??????. ? ???? ???? ?????? ?? ?? ????? ???????. ?? ???????? ???????? ????? ??????? ?????? ???? ??? 12 ????? (355 ??) ?? ?????? ?? ??? ??? 5 ?????? (148 ??) ?? ?????? ?? ??? ??? ????? ???? (44 ??) ?? ????????? ???????? ??????. ??????? ????  ???? ??? ??? ??????? ?12 ??? ????? ?? ???? ??????? ??????.  ?? ?????? ???????? ???????? ??? ??? ??????? ????? ??????? ?????? ??. ???? ????????? ??????.  ????? ????? ?????? ????????. ???? ??? ???. ???? ?????? ??????? ?????? ?? ??????? ???????:  ???? ??????? ?????? ?? ????.  ????? ??????? ???? ????? ???? ?? ??? ?????? ??? ????? ?? ?????? ??????. ????? ???????? ????? ?? ??????? ???????:  ??????? ???? ??????.  ????? ???? ????? ????.  ?????? ???? ?? ??????.  ?????? ????? ?? ????? ???????. ??? ???? ??? ??????? ????? ??? ???? ???? ?????. ???? ???????? ??? ?????. ???? ???? 911.  ???? ?? ????? ?? ??? ???? ??????? ????? ?? ??.  ?? ??? ??????? ????? ??? ????????. ????  ???? ??? ??????? ?12 ??? ???? ?? ???? ?? ??? ????????? ?? ?????.  ?? ??????? ??????? ???? ?????? ??? ????? ?? ???? ?? ??????? ???? ????? ??? ??????? ?12? ?? ??? ???? ????? ??? ?????? ??????? ?12 ?? ?????? ???? ???????? ?? ?????? ?????? ???????? ??? ?? ?????? ?? ??????? ???????? ?? ????? ????? ?????.  ????? ??????? ???? ????? ??? ??????? ?12.  ?? ???? ?????? ????? ??????? ?????????? ???? ????????? ?? ?????? ??? ??? ??????? ?12? ?? ????? ???????? ???????? ???? ????? ??? ??????? ?12. ??? ????? ?? ??? ????????? ?? ???? ?????? ????????? ???? ?????? ???? ??????? ??????. ???? ?? ?????? ??? ????? ???? ?? ???? ?? ???? ??????? ??????.? Document Revised: 06/22/2021 Document Reviewed: 06/22/2021 Elsevier Patient Education  2024 [employer].

## 2023-10-31 NOTE — Progress Notes (Signed)
 [facility]   [phone] [phone]) [phone]   Clinic Follow up Note   Patient Care Team: Patient, No Pcp Per as PCP - General (General Practice) [name], DO as Consulting Physician (Gastroenterology) [name], MD as Consulting Physician (General Surgery) [name], MD as Consulting Physician (Hematology) [name], [name], NP as Nurse Practitioner (Nurse Practitioner)  Date of Service:  10/31/2023  CHIEF COMPLAINT: f/u of colon cancer and B12 deficiency  CURRENT THERAPY:  Cancer surveillance Monthly B12 injection  Oncology History   Malignant neoplasm of sigmoid colon (HCC)  pT3pN1bM0 stage IIIb, MMR normal, MSI-stable  -Diagnosed in November 2022, status post surgical resection by Dr. Teresa with positive radial margin. -He received 4 cycles adjuvant chemotherapy CapeOx, completed in 11/2021 -He subsequently received concurrent chemoradiation with Xeloda , completed in May 2023 -He is on cancer surveillance, doing well -CT from 10/19/2023 showed NED   B12 deficiency anemia -he did not tolerate oral B12, but he admits he took 1000mg  BID. We again discussed B12 injections to help improve his neuropathy in his feet. After lengthy discussion, he agrees to try B12 injections if level still low.  -repeated B12 level on 09/27/2022 was still low 176, He received B12 injection intermittently    Assessment and Plan   Colon cancer  -It has been over 2 years since his initial diagnosis -He is clinically doing well, lab reviewed, exam unremarkable, there is no clinical concern for recurrence -I personally reviewed his restaging CT scan from October 19, 2023, which showed no evidence of recurrence -Continue cancer surveillance, next surveillance CT scan in a year   Constipation Chronic constipation with incomplete evacuation despite Docolax use. Discussed increasing water and fiber intake as initial management. - Increase water intake -  Increase dietary fiber - Consider over-the-counter fiber supplements  Vitamin B12 Deficiency Low B12 levels, last injection 6 months ago. Prefers injections over oral supplements. Discussed importance of maintaining B12 levels to prevent cognitive and nerve function issues, including neuropathy and dementia. Monthly injections recommended. - Administer B12 injection today - Schedule monthly B12 injections  Benign Prostatic Hyperplasia (BPH) Mild prostate enlargement without urinary symptoms. Discussed BPH symptoms and need for prostate cancer screening. Explained BPH is age-related and exercise is not effective in reducing prostate size. Discussed PSA test. - Order PSA test today -I encouraged patient to establish care with primary care physician for ongoing management   Plan -Lab and CT scan reviewed, no evidence of recurrence -B12 injection today and continue every months - Schedule follow-up in 4 months      SUMMARY OF ONCOLOGIC HISTORY: Oncology History  Malignant neoplasm of sigmoid colon (HCC)  07/20/2021 Procedure   Diagnostic EGD by Dr. San - Normal esophagus. - Z-line regular, 40 cm from the incisors. - Normal stomach. Biopsied. - Normal examined duodenum. Biopsied. Colonoscopy - Hemorrhoids found on perianal exam. - Likely malignant completely obstructing tumor in the sigmoid colon. Biopsied. Tattooed. - Non-bleeding internal hemorrhoids.   07/20/2021 Initial Biopsy   Diagnosis 1. Duodenum, Biopsy - BENIGN DUODENAL MUCOSA - NO ACUTE INFLAMMATION, VILLOUS BLUNTING OR INCREASED INTRAEPITHELIAL LYMPHOCYTES 2. Stomach, biopsy - MILD CHRONIC GASTRITIS WITH REACTIVE CHANGES - NO H. PYLORI OR INTESTINAL METAPLASIA IDENTIFIED - SEE COMMENT 3. Sigmoid Colon Biopsy - ADENOCARCINOMA - SEE COMMENT Microscopic Comment 2. H. pylori immunohistochemistry is NEGATIVE for microorganisms.   07/20/2021 Tumor Marker   CEA normal 3.2   07/28/2021 Imaging   Staging CT  CAP IMPRESSION: 1. Circumferential wall thickening  of the distal sigmoid colon, in keeping with primary colon malignancy identified by colonoscopy. 2. There is some nodularity in the adjacent mesocolon about the inferior mesenteric artery, as well as prominent subcentimeter lymph nodes, worrisome for early nodal or soft tissue metastatic disease. 3. No evidence of distant metastatic disease in the chest, abdomen, or pelvis.   08/31/2021 Cancer Staging   Staging form: Colon and Rectum, AJCC 8th Edition - Pathologic stage from 08/31/2021: Stage IIIB (pT3, pN1b, cM0) - Signed by [name], [name], NP on 09/19/2021 Stage prefix: Initial diagnosis Histologic grading system: 4 grade system Histologic grade (G): G2 Laterality: Left Lymph-vascular invasion (LVI): LVI present/identified, NOS Carcinoembryonic antigen (CEA) (ng/mL): 3.2 Perineural invasion (PNI): Absent   08/31/2021 Definitive Surgery   PREOP DIAGNOSIS: sigmoid colon cancer POSTOP DIAGNOSIS: Rectosigmoid colon cancer PROCEDURE:  Robotic assisted low anterior resection with double stapled colorectal anastomosis Intraoperative assessment of perfusion (ICG) Flexible sigmoidoscopy Bilateral transversus abdominus plane blocks SURGEON: Lonni HERO. Teresa, MD ASSISTANT: Bernarda Ned, MD   08/31/2021 Pathology Results   FINAL MICROSCOPIC DIAGNOSIS: A. COLON, RECTOSIGMOID, RESECTION: - Invasive moderately differentiated adenocarcinoma, 4 cm, involving distal sigmoid colon - Carcinoma invades into subserosa - Inked radial margin is positive for carcinoma - Metastatic carcinoma to two of twenty-two lymph nodes (2/22) - See oncology table B. FINAL DISTAL MARGIN: - Colonic donut, negative for carcinoma pT3pN1b, stage IIIb MMR IHC normal MSI-stable   09/19/2021 Initial Diagnosis   Malignant neoplasm of sigmoid colon (HCC)   09/30/2021 - 12/02/2021 Chemotherapy   Patient is on Treatment Plan : COLORECTAL Xelox (Capeox) q21d      01/04/2022 Procedure   Colonoscopy, Dr. San  Impression: - One 5 mm polyp in the proximal transverse colon, removed with a cold snare. Resected and retrieved. - Patent end-to-end low-anterior anastomosis, located 20 cm from the anal verge, characterized by healthy appearing mucosa. Biopsied. - One 3 mm polyp in the rectosigmoid, removed with a cold snare. Resected and retrieved. - Non-bleeding internal hemorrhoids. - Stool in the entire examined colon.   01/04/2022 Pathology Results   Diagnosis 1. Surgical [P], colon, transverse, polyp (1) - TUBULAR ADENOMA WITHOUT HIGH-GRADE DYSPLASIA. 2. Surgical [P], colon, rectum - COLONIC MUCOSA WITH CHANGES CONSISTENT WITH MUCOSAL PROLAPSE. - NO ADENOMATOUS CHANGE OR MALIGNANCY. 3. Surgical [P], colon, rectum, polyp (1) - HYPERPLASTIC POLYP (S).    Imaging     09/28/2022 Imaging    IMPRESSION: 1. Post sigmoid resection. No evidence of recurrent or metastatic disease in the chest, abdomen or pelvis. 2. Aortic atherosclerosis.   03/01/2023 Procedure   Colonoscopy -single 4 mm polyp in ascending colon  -patent, end-to-end colo-colonic anastomosis with healthy appearing mucosa  -non-bleeding, internal hemorrhoids.  -recommend repeat colonoscopy in 3 years    04/04/2023 Imaging   CT chest, abdomen, and pelvis with contrast  IMPRESSION: 1. Sigmoid colon resection and reanastomosis. 2. No evidence of recurrent or metastatic disease in the chest, abdomen, or pelvis. 3. No CT findings to explain chest pain. 4. Mild prostatomegaly.      Discussed the use of AI scribe software for clinical note transcription with the patient, who gave verbal consent to proceed.  History of Present Illness   The patient, a 58 year old with a history of colon cancer, presents with constipation. He describes a sensation of incomplete evacuation despite using over-the-counter laxatives like Docolax. This issue has been ongoing for the past couple of  months. His last colonoscopy was in May of the previous year, during which a 4mm  polyp was removed. He denies any blood in his stool, nausea, vomiting, bloating, or pain during bowel movements.  The patient also mentions a noted mild prostate enlargement on his chart. He denies any urinary issues. He has not been screened for prostate cancer and does not currently have a primary care physician.  Additionally, the patient has been receiving intermittent B12 injections due to low levels. The last injection was approximately six months ago. He denies noticing any significant difference in his symptoms with the injections.         All other systems were reviewed with the patient and are negative.  MEDICAL HISTORY:  Past Medical History:  Diagnosis Date   Anemia 10/2021   IDA r/o Chemo, colon ca dx, Vitamin B12 deficiency   Arthritis    Cancer (HCC) 07/2021   Colon   Cataract    LEFT EYE   GERD (gastroesophageal reflux disease)     SURGICAL HISTORY: Past Surgical History:  Procedure Laterality Date   COLON RESECTION  08/31/2021   Colon ca, Dr Raiford   COLONOSCOPY  01/04/2022   COLONOSCOPY WITH PROPOFOL   06/2021   FLEXIBLE SIGMOIDOSCOPY N/A 08/31/2021   Procedure: FLEXIBLE SIGMOIDOSCOPY;  Surgeon: [name], MD;  Location: [facility];  Service: General;  Laterality: N/A;   NOSE SURGERY     WISDOM TOOTH EXTRACTION     1988    I have reviewed the social history and family history with the patient and they are unchanged from previous note.  ALLERGIES:  has no known allergies.  MEDICATIONS:  No current outpatient medications on file.   No current facility-administered medications for this visit.    PHYSICAL EXAMINATION: ECOG PERFORMANCE STATUS: 0 - Asymptomatic  Vitals:   10/31/23 0907  BP: 134/87  Pulse: 68  Resp: 18  Temp: (!) 97.3 F (36.3 C)  SpO2: 99%   Wt Readings from Last 3 Encounters:  10/31/23 201 lb 8 oz (91.4 kg)  06/20/23 196 lb 8 oz  (89.1 kg)  03/01/23 203 lb (92.1 kg)     GENERAL:alert, no distress and comfortable SKIN: skin color, texture, turgor are normal, no rashes or significant lesions EYES: normal, Conjunctiva are pink and non-injected, sclera clear NECK: supple, thyroid normal size, non-tender, without nodularity LYMPH:  no palpable lymphadenopathy in the cervical, axillary  LUNGS: clear to auscultation and percussion with normal breathing effort HEART: regular rate & rhythm and no murmurs and no lower extremity edema ABDOMEN:abdomen soft, non-tender and normal bowel sounds Musculoskeletal:no cyanosis of digits and no clubbing  NEURO: alert & oriented x 3 with fluent speech, no focal motor/sensory deficits   LABORATORY DATA:  I have reviewed the data as listed    Latest Ref Rng & Units 10/31/2023   10:07 AM 10/19/2023    1:19 PM 06/20/2023   11:13 AM  CBC  WBC 4.0 - 10.5 K/uL 4.9  3.9  3.8   Hemoglobin 13.0 - 17.0 g/dL 85.0  85.1  85.3   Hematocrit 39.0 - 52.0 % 45.1  44.3  44.0   Platelets 150 - 400 K/uL 219  217  226         Latest Ref Rng & Units 10/31/2023   10:07 AM 10/19/2023    1:19 PM 06/20/2023   11:13 AM  CMP  Glucose 70 - 99 mg/dL 82  894  82   BUN 6 - 20 mg/dL 11  10  13    Creatinine 0.61 - 1.24 mg/dL 9.00  9.09  0.99   Sodium 135 - 145 mmol/L 141  141  140   Potassium 3.5 - 5.1 mmol/L 3.9  4.0  4.2   Chloride 98 - 111 mmol/L 105  106  106   CO2 22 - 32 mmol/L 30  28  28    Calcium 8.9 - 10.3 mg/dL 9.3  9.3  9.2   Total Protein 6.5 - 8.1 g/dL 7.4  7.7  7.7   Total Bilirubin 0.0 - 1.2 mg/dL 0.6  0.5  0.5   Alkaline Phos 38 - 126 U/L 89  86  85   AST 15 - 41 U/L 17  17  18    ALT 0 - 44 U/L 12  11  12        RADIOGRAPHIC STUDIES: I have personally reviewed the radiological images as listed and agreed with the findings in the report. No results found.    Orders Placed This Encounter  Procedures   Prostate-Specific AG, Serum    Standing Status:   Future    Number of  Occurrences:   1    Expected Date:   10/31/2023    Expiration Date:   10/30/2024   All questions were answered. The patient knows to call the clinic with any problems, questions or concerns. No barriers to learning was detected. The total time spent in the appointment was 30 minutes.     Onita Mattock, MD 10/31/2023

## 2023-11-01 LAB — PROSTATE-SPECIFIC AG, SERUM (LABCORP): Prostate Specific Ag, Serum: 0.8 ng/mL (ref 0.0–4.0)

## 2023-11-02 ENCOUNTER — Encounter: Payer: Self-pay | Admitting: Hematology

## 2023-11-28 ENCOUNTER — Inpatient Hospital Stay: Payer: 59 | Attending: Hematology

## 2023-11-28 VITALS — BP 133/84 | HR 64 | Temp 98.0°F | Resp 18

## 2023-11-28 DIAGNOSIS — Z9049 Acquired absence of other specified parts of digestive tract: Secondary | ICD-10-CM

## 2023-11-28 DIAGNOSIS — D519 Vitamin B12 deficiency anemia, unspecified: Secondary | ICD-10-CM | POA: Insufficient documentation

## 2023-11-28 DIAGNOSIS — C187 Malignant neoplasm of sigmoid colon: Secondary | ICD-10-CM

## 2023-11-28 DIAGNOSIS — E860 Dehydration: Secondary | ICD-10-CM

## 2023-11-28 MED ORDER — CYANOCOBALAMIN 1000 MCG/ML IJ SOLN
1000.0000 ug | Freq: Once | INTRAMUSCULAR | Status: AC
  Administered 2023-11-28: 1000 ug via INTRAMUSCULAR
  Filled 2023-11-28: qty 1

## 2023-12-26 ENCOUNTER — Inpatient Hospital Stay: Payer: 59

## 2023-12-26 ENCOUNTER — Telehealth: Payer: Self-pay | Admitting: Nurse Practitioner

## 2023-12-26 NOTE — Telephone Encounter (Signed)
Patient is aware of rescheduled appointment times/dates 

## 2023-12-27 ENCOUNTER — Inpatient Hospital Stay: Attending: Hematology

## 2023-12-27 VITALS — BP 142/77 | HR 57 | Temp 98.1°F | Resp 18

## 2023-12-27 DIAGNOSIS — D519 Vitamin B12 deficiency anemia, unspecified: Secondary | ICD-10-CM | POA: Diagnosis present

## 2023-12-27 DIAGNOSIS — Z9049 Acquired absence of other specified parts of digestive tract: Secondary | ICD-10-CM

## 2023-12-27 DIAGNOSIS — E860 Dehydration: Secondary | ICD-10-CM

## 2023-12-27 DIAGNOSIS — C187 Malignant neoplasm of sigmoid colon: Secondary | ICD-10-CM

## 2023-12-27 MED ORDER — CYANOCOBALAMIN 1000 MCG/ML IJ SOLN
1000.0000 ug | Freq: Once | INTRAMUSCULAR | Status: AC
  Administered 2023-12-27: 1000 ug via INTRAMUSCULAR
  Filled 2023-12-27: qty 1

## 2024-01-23 ENCOUNTER — Telehealth: Payer: Self-pay | Admitting: Hematology

## 2024-01-23 ENCOUNTER — Inpatient Hospital Stay: Payer: 59 | Attending: Hematology

## 2024-01-23 DIAGNOSIS — D519 Vitamin B12 deficiency anemia, unspecified: Secondary | ICD-10-CM | POA: Insufficient documentation

## 2024-01-24 ENCOUNTER — Other Ambulatory Visit: Payer: Self-pay

## 2024-01-24 ENCOUNTER — Inpatient Hospital Stay

## 2024-01-24 VITALS — BP 144/83 | HR 61 | Temp 98.4°F

## 2024-01-24 DIAGNOSIS — C187 Malignant neoplasm of sigmoid colon: Secondary | ICD-10-CM

## 2024-01-24 DIAGNOSIS — D519 Vitamin B12 deficiency anemia, unspecified: Secondary | ICD-10-CM | POA: Diagnosis present

## 2024-01-24 DIAGNOSIS — E860 Dehydration: Secondary | ICD-10-CM

## 2024-01-24 DIAGNOSIS — Z9049 Acquired absence of other specified parts of digestive tract: Secondary | ICD-10-CM

## 2024-01-24 MED ORDER — CYANOCOBALAMIN 1000 MCG/ML IJ SOLN
1000.0000 ug | Freq: Once | INTRAMUSCULAR | Status: AC
  Administered 2024-01-24: 1000 ug via INTRAMUSCULAR
  Filled 2024-01-24: qty 1

## 2024-02-26 ENCOUNTER — Other Ambulatory Visit: Payer: Self-pay | Admitting: Nurse Practitioner

## 2024-02-26 DIAGNOSIS — C187 Malignant neoplasm of sigmoid colon: Secondary | ICD-10-CM

## 2024-02-26 DIAGNOSIS — D518 Other vitamin B12 deficiency anemias: Secondary | ICD-10-CM

## 2024-02-26 NOTE — Progress Notes (Deleted)
 Patient Care Team: Patient, No Pcp Per as PCP - General (General Practice) Cirigliano, Laquetta Plank, DO as Consulting Physician (Gastroenterology) Melvenia Stabs, MD as Consulting Physician (General Surgery) Sonja Selawik, MD as Consulting Physician (Hematology) [name], [name], NP as Nurse Practitioner (Nurse Practitioner)  Clinic Day:  02/26/2024  Referring physician: No ref. provider found  ASSESSMENT & PLAN:   Assessment & Plan: Malignant neoplasm of sigmoid colon (HCC)  pT3pN1bM0 stage IIIb, MMR normal, MSI-stable  -Diagnosed in November 2022, status post surgical resection by Dr. Camilo Cella with positive radial margin. -He received 4 cycles adjuvant chemotherapy CapeOx, completed in 11/2021 -He subsequently received concurrent chemoradiation with Xeloda , completed in May 2023 -He is on cancer surveillance, doing well -CT from 10/19/2023 showed NED - Continues monthly B12 injections.   Continue monthly b12 injections depending on level today Follow up 4 months and sooner if needed  Surveillance CT CAP due 09/2023.   The patient understands the plans discussed today and is in agreement with them.  He knows to contact our office if he develops concerns prior to his next appointment.  I provided *** minutes of face-to-face time during this encounter and > 50% was spent counseling as documented under my assessment and plan.    Sharyon Deis, NP  [facility] - A DEPT OF [facility] [address] [place] [address] Dept: [phone] Dept Fax: [phone]   No orders of the defined types were placed in this encounter.     CHIEF COMPLAINT:  CC: Malignant neoplasm of sigmoid colon and B12 deficiency  Current Treatment: Cancer surveillance; monthly B12 injections  INTERVAL HISTORY:  Ethan Martin is here today for repeat clinical assessment.  He was last seen by Dr. Maryalice Smaller on 10/31/2023.  Surveillance CT CAP was negative  for evidence of recurrence or metastatic disease.  DEXA scan will be due in December 2025.  Continues to get B12 injections monthly.  Due for 1 today.  He denies fevers or chills. He denies pain. His appetite is good. His weight {Weight change:10426}.  I have reviewed the past medical history, past surgical history, social history and family history with the patient and they are unchanged from previous note.  ALLERGIES:  has no known allergies.  MEDICATIONS:  No current outpatient medications on file.   No current facility-administered medications for this visit.    HISTORY OF PRESENT ILLNESS:   Oncology History  Malignant neoplasm of sigmoid colon (HCC)  07/20/2021 Procedure   Diagnostic EGD by Dr. Karene Oto - Normal esophagus. - Z-line regular, 40 cm from the incisors. - Normal stomach. Biopsied. - Normal examined duodenum. Biopsied. Colonoscopy - Hemorrhoids found on perianal exam. - Likely malignant completely obstructing tumor in the sigmoid colon. Biopsied. Tattooed. - Non-bleeding internal hemorrhoids.   07/20/2021 Initial Biopsy   Diagnosis 1. Duodenum, Biopsy - BENIGN DUODENAL MUCOSA - NO ACUTE INFLAMMATION, VILLOUS BLUNTING OR INCREASED INTRAEPITHELIAL LYMPHOCYTES 2. Stomach, biopsy - MILD CHRONIC GASTRITIS WITH REACTIVE CHANGES - NO H. PYLORI OR INTESTINAL METAPLASIA IDENTIFIED - SEE COMMENT 3. Sigmoid Colon Biopsy - ADENOCARCINOMA - SEE COMMENT Microscopic Comment 2. H. pylori immunohistochemistry is NEGATIVE for microorganisms.   07/20/2021 Tumor Marker   CEA normal 3.2   07/28/2021 Imaging   Staging CT CAP IMPRESSION: 1. Circumferential wall thickening of the distal sigmoid colon, in keeping with primary colon malignancy identified by colonoscopy. 2. There is some nodularity in the adjacent mesocolon about the inferior  mesenteric artery, as well as prominent subcentimeter lymph nodes, worrisome for early nodal or soft tissue metastatic disease. 3. No  evidence of distant metastatic disease in the chest, abdomen, or pelvis.   08/31/2021 Cancer Staging   Staging form: Colon and Rectum, AJCC 8th Edition - Pathologic stage from 08/31/2021: Stage IIIB (pT3, pN1b, cM0) - Signed by [name], [name], NP on 09/19/2021 Stage prefix: Initial diagnosis Histologic grading system: 4 grade system Histologic grade (G): G2 Laterality: Left Lymph-vascular invasion (LVI): LVI present/identified, NOS Carcinoembryonic antigen (CEA) (ng/mL): 3.2 Perineural invasion (PNI): Absent   08/31/2021 Definitive Surgery   PREOP DIAGNOSIS: sigmoid colon cancer POSTOP DIAGNOSIS: Rectosigmoid colon cancer PROCEDURE:  Robotic assisted low anterior resection with double stapled colorectal anastomosis Intraoperative assessment of perfusion (ICG) Flexible sigmoidoscopy Bilateral transversus abdominus plane blocks SURGEON: Renford Cartwright. Camilo Cella, MD ASSISTANT: Joyce Nixon, MD   08/31/2021 Pathology Results   FINAL MICROSCOPIC DIAGNOSIS: A. COLON, RECTOSIGMOID, RESECTION: - Invasive moderately differentiated adenocarcinoma, 4 cm, involving distal sigmoid colon - Carcinoma invades into subserosa - Inked radial margin is positive for carcinoma - Metastatic carcinoma to two of twenty-two lymph nodes (2/22) - See oncology table B. FINAL DISTAL MARGIN: - Colonic donut, negative for carcinoma pT3pN1b, stage IIIb MMR IHC normal MSI-stable   09/19/2021 Initial Diagnosis   Malignant neoplasm of sigmoid colon (HCC)   09/30/2021 - 12/02/2021 Chemotherapy   Patient is on Treatment Plan : COLORECTAL Xelox (Capeox) q21d     01/04/2022 Procedure   Colonoscopy, Dr. Karene Oto  Impression: - One 5 mm polyp in the proximal transverse colon, removed with a cold snare. Resected and retrieved. - Patent end-to-end low-anterior anastomosis, located 20 cm from the anal verge, characterized by healthy appearing mucosa. Biopsied. - One 3 mm polyp in the rectosigmoid, removed with a cold  snare. Resected and retrieved. - Non-bleeding internal hemorrhoids. - Stool in the entire examined colon.   01/04/2022 Pathology Results   Diagnosis 1. Surgical [P], colon, transverse, polyp (1) - TUBULAR ADENOMA WITHOUT HIGH-GRADE DYSPLASIA. 2. Surgical [P], colon, rectum - COLONIC MUCOSA WITH CHANGES CONSISTENT WITH MUCOSAL PROLAPSE. - NO ADENOMATOUS CHANGE OR MALIGNANCY. 3. Surgical [P], colon, rectum, polyp (1) - HYPERPLASTIC POLYP (S).    Imaging     09/28/2022 Imaging    IMPRESSION: 1. Post sigmoid resection. No evidence of recurrent or metastatic disease in the chest, abdomen or pelvis. 2. Aortic atherosclerosis.   03/01/2023 Procedure   Colonoscopy -single 4 mm polyp in ascending colon  -patent, end-to-end colo-colonic anastomosis with healthy appearing mucosa  -non-bleeding, internal hemorrhoids.  -recommend repeat colonoscopy in 3 years    04/04/2023 Imaging   CT chest, abdomen, and pelvis with contrast  IMPRESSION: 1. Sigmoid colon resection and reanastomosis. 2. No evidence of recurrent or metastatic disease in the chest, abdomen, or pelvis. 3. No CT findings to explain chest pain. 4. Mild prostatomegaly.       REVIEW OF SYSTEMS:   Constitutional: Denies fevers, chills or abnormal weight loss Eyes: Denies blurriness of vision Ears, nose, mouth, throat, and face: Denies mucositis or sore throat Respiratory: Denies cough, dyspnea or wheezes Cardiovascular: Denies palpitation, chest discomfort or lower extremity swelling Gastrointestinal:  Denies nausea, heartburn or change in bowel habits Skin: Denies abnormal skin rashes Lymphatics: Denies new lymphadenopathy or easy bruising Neurological:Denies numbness, tingling or new weaknesses Behavioral/Psych: Mood is stable, no new changes  All other systems were reviewed with the patient and are negative.   VITALS:  There were no vitals taken for this visit.  Wt Readings from Last 3 Encounters:  10/31/23 201 lb  8 oz (91.4 kg)  06/20/23 196 lb 8 oz (89.1 kg)  03/01/23 203 lb (92.1 kg)    There is no height or weight on file to calculate BMI.  Performance status (ECOG): {CHL ONC [id number]  PHYSICAL EXAM:   GENERAL:alert, no distress and comfortable SKIN: skin color, texture, turgor are normal, no rashes or significant lesions EYES: normal, Conjunctiva are pink and non-injected, sclera clear OROPHARYNX:no exudate, no erythema and lips, buccal mucosa, and tongue normal  NECK: supple, thyroid normal size, non-tender, without nodularity LYMPH:  no palpable lymphadenopathy in the cervical, axillary or inguinal LUNGS: clear to auscultation and percussion with normal breathing effort HEART: regular rate & rhythm and no murmurs and no lower extremity edema ABDOMEN:abdomen soft, non-tender and normal bowel sounds Musculoskeletal:no cyanosis of digits and no clubbing  NEURO: alert & oriented x 3 with fluent speech, no focal motor/sensory deficits  LABORATORY DATA:  I have reviewed the data as listed    Component Value Date/Time   NA 141 10/31/2023 1007   K 3.9 10/31/2023 1007   CL 105 10/31/2023 1007   CO2 30 10/31/2023 1007   GLUCOSE 82 10/31/2023 1007   BUN 11 10/31/2023 1007   CREATININE 0.99 10/31/2023 1007   CREATININE 0.92 07/15/2021 1411   CALCIUM 9.3 10/31/2023 1007   PROT 7.4 10/31/2023 1007   ALBUMIN 4.1 10/31/2023 1007   AST 17 10/31/2023 1007   ALT 12 10/31/2023 1007   ALKPHOS 89 10/31/2023 1007   BILITOT 0.6 10/31/2023 1007   GFRNONAA >60 10/31/2023 1007    No results found for: "SPEP", "UPEP"  Lab Results  Component Value Date   WBC 4.9 10/31/2023   NEUTROABS 2.9 10/31/2023   HGB 14.9 10/31/2023   HCT 45.1 10/31/2023   MCV 89.7 10/31/2023   PLT 219 10/31/2023      Chemistry      Component Value Date/Time   NA 141 10/31/2023 1007   K 3.9 10/31/2023 1007   CL 105 10/31/2023 1007   CO2 30 10/31/2023 1007   BUN 11 10/31/2023 1007   CREATININE 0.99  10/31/2023 1007   CREATININE 0.92 07/15/2021 1411      Component Value Date/Time   CALCIUM 9.3 10/31/2023 1007   ALKPHOS 89 10/31/2023 1007   AST 17 10/31/2023 1007   ALT 12 10/31/2023 1007   BILITOT 0.6 10/31/2023 1007       RADIOGRAPHIC STUDIES: I have personally reviewed the radiological images as listed and agreed with the findings in the report. No results found.

## 2024-02-26 NOTE — Assessment & Plan Note (Deleted)
 pT3pN1bM0 stage IIIb, MMR normal, MSI-stable  -Diagnosed in November 2022, status post surgical resection by Dr. Camilo Cella with positive radial margin. -He received 4 cycles adjuvant chemotherapy CapeOx, completed in 11/2021 -He subsequently received concurrent chemoradiation with Xeloda , completed in May 2023 -He is on cancer surveillance, doing well -CT from 10/19/2023 showed NED - Continues monthly B12 injections.

## 2024-02-27 ENCOUNTER — Telehealth: Payer: Self-pay

## 2024-02-27 ENCOUNTER — Inpatient Hospital Stay: Payer: 59

## 2024-02-27 ENCOUNTER — Telehealth: Payer: Self-pay | Admitting: Hematology

## 2024-02-27 ENCOUNTER — Inpatient Hospital Stay: Payer: 59 | Admitting: Nurse Practitioner

## 2024-02-27 ENCOUNTER — Inpatient Hospital Stay: Payer: 59 | Attending: Hematology

## 2024-02-27 DIAGNOSIS — Z9221 Personal history of antineoplastic chemotherapy: Secondary | ICD-10-CM | POA: Insufficient documentation

## 2024-02-27 DIAGNOSIS — Z85038 Personal history of other malignant neoplasm of large intestine: Secondary | ICD-10-CM | POA: Insufficient documentation

## 2024-02-27 DIAGNOSIS — C187 Malignant neoplasm of sigmoid colon: Secondary | ICD-10-CM

## 2024-02-27 DIAGNOSIS — R194 Change in bowel habit: Secondary | ICD-10-CM | POA: Insufficient documentation

## 2024-02-27 DIAGNOSIS — E538 Deficiency of other specified B group vitamins: Secondary | ICD-10-CM | POA: Insufficient documentation

## 2024-02-27 NOTE — Telephone Encounter (Signed)
 Called patient due to not showing up for his app today. Patient stated he forgot. Will send a message to scheduling to have it rescheduled. Will no show for todays appointments.

## 2024-02-27 NOTE — Assessment & Plan Note (Signed)
 pT3pN1bM0 stage IIIb, MMR normal, MSI-stable  -Diagnosed in November 2022, status post surgical resection by Dr. Camilo Cella with positive radial margin. -He received 4 cycles adjuvant chemotherapy CapeOx, completed in 11/2021 -He subsequently received concurrent chemoradiation with Xeloda , completed in May 2023 -He is on cancer surveillance, doing well -CT from 10/19/2023 showed NED

## 2024-02-28 ENCOUNTER — Inpatient Hospital Stay

## 2024-02-28 ENCOUNTER — Inpatient Hospital Stay: Admitting: Hematology

## 2024-02-28 ENCOUNTER — Encounter: Payer: Self-pay | Admitting: Hematology

## 2024-02-28 VITALS — BP 136/60 | HR 73 | Temp 98.2°F | Resp 20 | Wt 198.1 lb

## 2024-02-28 DIAGNOSIS — C187 Malignant neoplasm of sigmoid colon: Secondary | ICD-10-CM

## 2024-02-28 DIAGNOSIS — E860 Dehydration: Secondary | ICD-10-CM

## 2024-02-28 DIAGNOSIS — E538 Deficiency of other specified B group vitamins: Secondary | ICD-10-CM | POA: Diagnosis present

## 2024-02-28 DIAGNOSIS — Z9221 Personal history of antineoplastic chemotherapy: Secondary | ICD-10-CM | POA: Diagnosis not present

## 2024-02-28 DIAGNOSIS — R194 Change in bowel habit: Secondary | ICD-10-CM | POA: Diagnosis not present

## 2024-02-28 DIAGNOSIS — D518 Other vitamin B12 deficiency anemias: Secondary | ICD-10-CM

## 2024-02-28 DIAGNOSIS — Z9049 Acquired absence of other specified parts of digestive tract: Secondary | ICD-10-CM

## 2024-02-28 DIAGNOSIS — Z85038 Personal history of other malignant neoplasm of large intestine: Secondary | ICD-10-CM | POA: Diagnosis not present

## 2024-02-28 LAB — CBC WITH DIFFERENTIAL (CANCER CENTER ONLY)
Abs Immature Granulocytes: 0 10*3/uL (ref 0.00–0.07)
Basophils Absolute: 0.1 10*3/uL (ref 0.0–0.1)
Basophils Relative: 1 %
Eosinophils Absolute: 0.1 10*3/uL (ref 0.0–0.5)
Eosinophils Relative: 4 %
HCT: 44.3 % (ref 39.0–52.0)
Hemoglobin: 14.9 g/dL (ref 13.0–17.0)
Immature Granulocytes: 0 %
Lymphocytes Relative: 37 %
Lymphs Abs: 1.4 10*3/uL (ref 0.7–4.0)
MCH: 29.3 pg (ref 26.0–34.0)
MCHC: 33.6 g/dL (ref 30.0–36.0)
MCV: 87 fL (ref 80.0–100.0)
Monocytes Absolute: 0.5 10*3/uL (ref 0.1–1.0)
Monocytes Relative: 13 %
Neutro Abs: 1.7 10*3/uL (ref 1.7–7.7)
Neutrophils Relative %: 45 %
Platelet Count: 213 10*3/uL (ref 150–400)
RBC: 5.09 MIL/uL (ref 4.22–5.81)
RDW: 13.4 % (ref 11.5–15.5)
WBC Count: 3.7 10*3/uL — ABNORMAL LOW (ref 4.0–10.5)
nRBC: 0 % (ref 0.0–0.2)

## 2024-02-28 LAB — CMP (CANCER CENTER ONLY)
ALT: 13 U/L (ref 0–44)
AST: 18 U/L (ref 15–41)
Albumin: 4.5 g/dL (ref 3.5–5.0)
Alkaline Phosphatase: 86 U/L (ref 38–126)
Anion gap: 8 (ref 5–15)
BUN: 11 mg/dL (ref 6–20)
CO2: 29 mmol/L (ref 22–32)
Calcium: 9.1 mg/dL (ref 8.9–10.3)
Chloride: 103 mmol/L (ref 98–111)
Creatinine: 0.98 mg/dL (ref 0.61–1.24)
GFR, Estimated: >60 mL/min (ref >60)
Glucose, Bld: 111 mg/dL — ABNORMAL HIGH (ref 70–99)
Potassium: 3.7 mmol/L (ref 3.5–5.1)
Sodium: 140 mmol/L (ref 135–145)
Total Bilirubin: 0.6 mg/dL (ref 0.0–1.2)
Total Protein: 8 g/dL (ref 6.5–8.1)

## 2024-02-28 LAB — IRON AND IRON BINDING CAPACITY (CC-WL,HP ONLY)
Iron: 94 ug/dL (ref 45–182)
Saturation Ratios: 24 % (ref 17.9–39.5)
TIBC: 392 ug/dL (ref 250–450)
UIBC: 298 ug/dL (ref 117–376)

## 2024-02-28 LAB — CEA (ACCESS): CEA (CHCC): <1 ng/mL (ref 0.00–5.00)

## 2024-02-28 LAB — FERRITIN: Ferritin: 55 ng/mL (ref 24–336)

## 2024-02-28 LAB — VITAMIN B12: Vitamin B-12: 295 pg/mL (ref 180–914)

## 2024-02-28 MED ORDER — CYANOCOBALAMIN 1000 MCG/ML IJ SOLN: 1000.0000 ug | INTRAMUSCULAR | 5 refills | Status: DC

## 2024-02-28 MED ORDER — CYANOCOBALAMIN 1000 MCG/ML IJ SOLN
1000.0000 ug | Freq: Once | INTRAMUSCULAR | Status: AC
  Administered 2024-02-28: 1000 ug via INTRAMUSCULAR
  Filled 2024-02-28: qty 1

## 2024-02-28 NOTE — Patient Instructions (Signed)
 Vitamin B12 Deficiency Vitamin B12 deficiency occurs when the body does not have enough of this important vitamin. The body needs this vitamin: To make red blood cells. To make DNA. This is the genetic material inside cells. To help the nerves work properly so they can carry messages from the brain to the body. Vitamin B12 deficiency can cause health problems, such as not having enough red blood cells in the blood (anemia). This can lead to nerve damage if untreated. What are the causes? This condition may be caused by: Not eating enough foods that contain vitamin B12. Not having enough stomach acid and digestive fluids to properly absorb vitamin B12 from the food that you eat. Having certain diseases that make it hard to absorb vitamin B12. These diseases include Crohn's disease, chronic pancreatitis, and cystic fibrosis. An autoimmune disorder in which the body does not make enough of a protein (intrinsic factor) within the stomach, resulting in not enough absorption of vitamin B12. Having a surgery in which part of the stomach or small intestine is removed. Taking certain medicines that make it hard for the body to absorb vitamin B12. These include: Heartburn medicines, such as antacids and proton pump inhibitors. Some medicines that are used to treat diabetes. What increases the risk? The following factors may make you more likely to develop a vitamin B12 deficiency: Being an older adult. Eating a vegetarian or vegan diet that does not include any foods that come from animals. Eating a poor diet while you are pregnant. Taking certain medicines. Having alcoholism. What are the signs or symptoms? In some cases, there are no symptoms of this condition. If the condition leads to anemia or nerve damage, various symptoms may occur, such as: Weakness. Tiredness (fatigue). Loss of appetite. Numbness or tingling in your hands and feet. Redness and burning of the tongue. Depression,  confusion, or memory problems. Trouble walking. If anemia is severe, symptoms can include: Shortness of breath. Dizziness. Rapid heart rate. How is this diagnosed? This condition may be diagnosed with a blood test to measure the level of vitamin B12 in your blood. You may also have other tests, including: A group of tests that measure certain characteristics of blood cells (complete blood count, CBC). A blood test to measure intrinsic factor. A procedure where a thin tube with a camera on the end is used to look into your stomach or intestines (endoscopy). Other tests may be needed to discover the cause of the deficiency. How is this treated? Treatment for this condition depends on the cause. This condition may be treated by: Changing your eating and drinking habits, such as: Eating more foods that contain vitamin B12. Drinking less alcohol or no alcohol. Getting vitamin B12 injections. Taking vitamin B12 supplements by mouth (orally). Your health care provider will tell you which dose is best for you. Follow these instructions at home: Eating and drinking  Include foods in your diet that come from animals and contain a lot of vitamin B12. These include: Meats and poultry. This includes beef, pork, chicken, [place], and organ meats, such as liver. Seafood. This includes clams, rainbow trout, salmon, tuna, and haddock. Eggs. Dairy foods such as milk, yogurt, and cheese. Eat foods that have vitamin B12 added to them (are fortified), such as ready-to-eat breakfast cereals. Check the label on the package to see if a food is fortified. The items listed above may not be a complete list of foods and beverages you can eat and drink. Contact a dietitian for  more information. Alcohol use Do not drink alcohol if: Your health care provider tells you not to drink. You are pregnant, may be pregnant, or are planning to become pregnant. If you drink alcohol: Limit how much you have to: 0-1 drink a  day for women. 0-2 drinks a day for men. Know how much alcohol is in your drink. In the [place] one drink equals one 12 oz bottle of beer (355 mL), one 5 oz glass of wine (148 mL), or one 1 oz glass of hard liquor (44 mL). General instructions Get vitamin B12 injections if told to by your health care provider. Take supplements only as told by your health care provider. Follow the directions carefully. Keep all follow-up visits. This is important. Contact a health care provider if: Your symptoms come back. Your symptoms get worse or do not improve with treatment. Get help right away: You develop shortness of breath. You have a rapid heart rate. You have chest pain. You become dizzy or you faint. These symptoms may be an emergency. Get help right away. Call 911. Do not wait to see if the symptoms will go away. Do not drive yourself to the hospital. Summary Vitamin B12 deficiency occurs when the body does not have enough of this important vitamin. Common causes include not eating enough foods that contain vitamin B12, not being able to absorb vitamin B12 from the food that you eat, having a surgery in which part of the stomach or small intestine is removed, or taking certain medicines. Eat foods that have vitamin B12 in them. Treatment may include making a change in the way you eat and drink, getting vitamin B12 injections, or taking vitamin B12 supplements. This information is not intended to replace advice given to you by your health care provider. Make sure you discuss any questions you have with your health care provider. Document Revised: 06/03/2021 Document Reviewed: 06/03/2021 Elsevier Patient Education  2024 [employer].

## 2024-02-28 NOTE — Progress Notes (Signed)
 [facility]   [phone] [phone]   Clinic Follow up Note   Patient Care Team: Patient, No Pcp Per as PCP - General (General Practice) Annis Kinder, DO as Consulting Physician (Gastroenterology) Melvenia Stabs, MD as Consulting Physician (General Surgery) Sonja [place], MD as Consulting Physician (Hematology) [name], [name], NP as Nurse Practitioner (Nurse Practitioner)  Date of Service:  02/28/2024  CHIEF COMPLAINT: f/u of colon cancer   CURRENT THERAPY:  Surveillance   Oncology History   Malignant neoplasm of sigmoid colon (HCC)  pT3pN1bM0 stage IIIb, MMR normal, MSI-stable  -Diagnosed in November 2022, status post surgical resection by Dr. Camilo Cella with positive radial margin. -He received 4 cycles adjuvant chemotherapy CapeOx, completed in 11/2021 -He subsequently received concurrent chemoradiation with Xeloda , completed in May 2023 -He is on cancer surveillance, doing well -CT from 10/19/2023 showed NED   Assessment & Plan Colon cancer Follow-up for colon cancer diagnosed in November 2022. Post-surgical status with previous colonoscopy in May 2024 showing a 4 mm polyp removed. No current symptoms such as hematochezia or abdominal pain. Bowel movement irregularities possibly related to post-surgical changes. Potential scar tissue causing narrowing requires further evaluation. - Schedule next scan in 2026. - Continue follow-up every four months for lab and exam. - Monitor for new symptoms such as bleeding or significant changes in bowel habits.  Bowel movement irregularities Reports sensation of incomplete evacuation and occasional narrow stools over the past 3-6 months. No pain or hematochezia associated with bowel movements. Possible post-surgical stricture in the sigmoid colon. Differential includes scar tissue formation leading to narrowing of the colon. - Refer to gastroenterology for evaluation and possible sigmoidoscopy to  assess for stricture or other abnormalities in the sigmoid colon. - Proceed with sigmoidoscopy as preferred over rectal exam.  Plan -will message his GI Dr. Karene Oto about sigmoidoscopy due to his bowl habit change  -lab and f/u in 4 months   -B12 injection today and next month for him to learn the injection, then he will do home injection monthly at home, prescription called in today   SUMMARY OF ONCOLOGIC HISTORY: Oncology History  Malignant neoplasm of sigmoid colon (HCC)  07/20/2021 Procedure   Diagnostic EGD by Dr. Karene Oto - Normal esophagus. - Z-line regular, 40 cm from the incisors. - Normal stomach. Biopsied. - Normal examined duodenum. Biopsied. Colonoscopy - Hemorrhoids found on perianal exam. - Likely malignant completely obstructing tumor in the sigmoid colon. Biopsied. Tattooed. - Non-bleeding internal hemorrhoids.   07/20/2021 Initial Biopsy   Diagnosis 1. Duodenum, Biopsy - BENIGN DUODENAL MUCOSA - NO ACUTE INFLAMMATION, VILLOUS BLUNTING OR INCREASED INTRAEPITHELIAL LYMPHOCYTES 2. Stomach, biopsy - MILD CHRONIC GASTRITIS WITH REACTIVE CHANGES - NO H. PYLORI OR INTESTINAL METAPLASIA IDENTIFIED - SEE COMMENT 3. Sigmoid Colon Biopsy - ADENOCARCINOMA - SEE COMMENT Microscopic Comment 2. H. pylori immunohistochemistry is NEGATIVE for microorganisms.   07/20/2021 Tumor Marker   CEA normal 3.2   07/28/2021 Imaging   Staging CT CAP IMPRESSION: 1. Circumferential wall thickening of the distal sigmoid colon, in keeping with primary colon malignancy identified by colonoscopy. 2. There is some nodularity in the adjacent mesocolon about the inferior mesenteric artery, as well as prominent subcentimeter lymph nodes, worrisome for early nodal or soft tissue metastatic disease. 3. No evidence of distant metastatic disease in the chest, abdomen, or pelvis.   08/31/2021 Cancer Staging   Staging form: Colon and Rectum, AJCC 8th Edition - Pathologic stage from 08/31/2021:  Stage IIIB (pT3, pN1b, cM0) -  Signed by [name], [name], NP on 09/19/2021 Stage prefix: Initial diagnosis Histologic grading system: 4 grade system Histologic grade (G): G2 Laterality: Left Lymph-vascular invasion (LVI): LVI present/identified, NOS Carcinoembryonic antigen (CEA) (ng/mL): 3.2 Perineural invasion (PNI): Absent   08/31/2021 Definitive Surgery   PREOP DIAGNOSIS: sigmoid colon cancer POSTOP DIAGNOSIS: Rectosigmoid colon cancer PROCEDURE:  Robotic assisted low anterior resection with double stapled colorectal anastomosis Intraoperative assessment of perfusion (ICG) Flexible sigmoidoscopy Bilateral transversus abdominus plane blocks SURGEON: Renford Cartwright. Camilo Cella, MD ASSISTANT: Joyce Nixon, MD   08/31/2021 Pathology Results   FINAL MICROSCOPIC DIAGNOSIS: A. COLON, RECTOSIGMOID, RESECTION: - Invasive moderately differentiated adenocarcinoma, 4 cm, involving distal sigmoid colon - Carcinoma invades into subserosa - Inked radial margin is positive for carcinoma - Metastatic carcinoma to two of twenty-two lymph nodes (2/22) - See oncology table B. FINAL DISTAL MARGIN: - Colonic donut, negative for carcinoma pT3pN1b, stage IIIb MMR IHC normal MSI-stable   09/19/2021 Initial Diagnosis   Malignant neoplasm of sigmoid colon (HCC)   09/30/2021 - 12/02/2021 Chemotherapy   Patient is on Treatment Plan : COLORECTAL Xelox (Capeox) q21d     01/04/2022 Procedure   Colonoscopy, Dr. Karene Oto  Impression: - One 5 mm polyp in the proximal transverse colon, removed with a cold snare. Resected and retrieved. - Patent end-to-end low-anterior anastomosis, located 20 cm from the anal verge, characterized by healthy appearing mucosa. Biopsied. - One 3 mm polyp in the rectosigmoid, removed with a cold snare. Resected and retrieved. - Non-bleeding internal hemorrhoids. - Stool in the entire examined colon.   01/04/2022 Pathology Results   Diagnosis 1. Surgical [P], colon, transverse,  polyp (1) - TUBULAR ADENOMA WITHOUT HIGH-GRADE DYSPLASIA. 2. Surgical [P], colon, rectum - COLONIC MUCOSA WITH CHANGES CONSISTENT WITH MUCOSAL PROLAPSE. - NO ADENOMATOUS CHANGE OR MALIGNANCY. 3. Surgical [P], colon, rectum, polyp (1) - HYPERPLASTIC POLYP (S).    Imaging     09/28/2022 Imaging    IMPRESSION: 1. Post sigmoid resection. No evidence of recurrent or metastatic disease in the chest, abdomen or pelvis. 2. Aortic atherosclerosis.   03/01/2023 Procedure   Colonoscopy -single 4 mm polyp in ascending colon  -patent, end-to-end colo-colonic anastomosis with healthy appearing mucosa  -non-bleeding, internal hemorrhoids.  -recommend repeat colonoscopy in 3 years    04/04/2023 Imaging   CT chest, abdomen, and pelvis with contrast  IMPRESSION: 1. Sigmoid colon resection and reanastomosis. 2. No evidence of recurrent or metastatic disease in the chest, abdomen, or pelvis. 3. No CT findings to explain chest pain. 4. Mild prostatomegaly.      Discussed the use of AI scribe software for clinical note transcription with the patient, who gave verbal consent to proceed.  History of Present Illness Ethan Martin is a 58 year old male with colon cancer who presents for follow-up.  He experiences a sensation of incomplete bowel evacuation during bowel movements for the past three to six months, with stool varying in size. This occurs with every bowel movement but is painless. He has no blood in the stool, pain, or incontinence, and has bowel movements twice daily.  He was diagnosed with colon cancer in November 2022 and underwent surgery involving the sigmoid colon. A follow-up colonoscopy in May 2024 revealed a 4 mm polyp, which was removed. He is concerned about polyp recurrence and prefers more frequent colonoscopies.  He receives monthly B12 injections and is considering home administration. He has a low white blood cell count, with normal kidney and liver functions.     All other  systems were reviewed with the patient and are negative.  MEDICAL HISTORY:  Past Medical History:  Diagnosis Date   Anemia 10/2021   IDA r/o Chemo, colon ca dx, Vitamin B12 deficiency   Arthritis    Cancer (HCC) 07/2021   Colon   Cataract    LEFT EYE   GERD (gastroesophageal reflux disease)     SURGICAL HISTORY: Past Surgical History:  Procedure Laterality Date   COLON RESECTION  08/31/2021   Colon ca, Dr Cecilia Coe   COLONOSCOPY  01/04/2022   COLONOSCOPY WITH PROPOFOL   06/2021   FLEXIBLE SIGMOIDOSCOPY N/A 08/31/2021   Procedure: FLEXIBLE SIGMOIDOSCOPY;  Surgeon: Melvenia Stabs, MD;  Location: [facility];  Service: General;  Laterality: N/A;   NOSE SURGERY     WISDOM TOOTH EXTRACTION     1988    I have reviewed the social history and family history with the patient and they are unchanged from previous note.  ALLERGIES:  has no known allergies.  MEDICATIONS:  Current Outpatient Medications  Medication Sig Dispense Refill   cyanocobalamin  (VITAMIN B12) 1000 MCG/ML injection Inject 1 mL (1,000 mcg total) into the muscle every 30 (thirty) days. 1 mL 5   No current facility-administered medications for this visit.    PHYSICAL EXAMINATION: ECOG PERFORMANCE STATUS: 0 - Asymptomatic  Vitals:   02/28/24 0921  BP: 136/60  Pulse: 73  Resp: 20  Temp: 98.2 F (36.8 C)  SpO2: 98%   Wt Readings from Last 3 Encounters:  02/28/24 198 lb 1.6 oz (89.9 kg)  10/31/23 201 lb 8 oz (91.4 kg)  06/20/23 196 lb 8 oz (89.1 kg)     GENERAL:alert, no distress and comfortable SKIN: skin color, texture, turgor are normal, no rashes or significant lesions EYES: normal, Conjunctiva are pink and non-injected, sclera clear NECK: supple, thyroid normal size, non-tender, without nodularity LYMPH:  no palpable lymphadenopathy in the cervical, axillary  LUNGS: clear to auscultation and percussion with normal breathing effort HEART: regular rate & rhythm and no murmurs and no lower  extremity edema ABDOMEN:abdomen soft, non-tender and normal bowel sounds Musculoskeletal:no cyanosis of digits and no clubbing  NEURO: alert & oriented x 3 with fluent speech, no focal motor/sensory deficits  Physical Exam ABDOMEN: Liver not enlarged, non-tender, no swelling.  LABORATORY DATA:  I have reviewed the data as listed    Latest Ref Rng & Units 02/28/2024    8:44 AM 10/31/2023   10:07 AM 10/19/2023    1:19 PM  CBC  WBC 4.0 - 10.5 K/uL 3.7  4.9  3.9   Hemoglobin 13.0 - 17.0 g/dL 29.5  62.1  30.8   Hematocrit 39.0 - 52.0 % 44.3  45.1  44.3   Platelets 150 - 400 K/uL 213  219  217         Latest Ref Rng & Units 02/28/2024    8:44 AM 10/31/2023   10:07 AM 10/19/2023    1:19 PM  CMP  Glucose 70 - 99 mg/dL 657  82  846   BUN 6 - 20 mg/dL 11  11  10    Creatinine 0.61 - 1.24 mg/dL 9.62  9.52  8.41   Sodium 135 - 145 mmol/L 140  141  141   Potassium 3.5 - 5.1 mmol/L 3.7  3.9  4.0   Chloride 98 - 111 mmol/L 103  105  106   CO2 22 - 32 mmol/L 29  30  28    Calcium 8.9 - 10.3 mg/dL 9.1  9.3  9.3   Total Protein 6.5 - 8.1 g/dL 8.0  7.4  7.7   Total Bilirubin 0.0 - 1.2 mg/dL 0.6  0.6  0.5   Alkaline Phos 38 - 126 U/L 86  89  86   AST 15 - 41 U/L 18  17  17    ALT 0 - 44 U/L 13  12  11        RADIOGRAPHIC STUDIES: I have personally reviewed the radiological images as listed and agreed with the findings in the report. No results found.    No orders of the defined types were placed in this encounter.  All questions were answered. The patient knows to call the clinic with any problems, questions or concerns. No barriers to learning was detected. The total time spent in the appointment was 30 minutes, including review of chart and various tests results, discussions about plan of care and coordination of care plan     Sonja Plevna, MD 02/28/2024

## 2024-02-29 LAB — PROSTATE-SPECIFIC AG, SERUM (LABCORP): Prostate Specific Ag, Serum: 1.2 ng/mL (ref 0.0–4.0)

## 2024-03-26 ENCOUNTER — Inpatient Hospital Stay: Attending: Hematology

## 2024-04-23 IMAGING — CT CT ABD-PELV W/ CM
2 of 5 series · 16 of 46 positions shown, 18 images · IV contrast (APPLIED)
Comparison: 07/28/2021

CLINICAL DATA: Follow-up colon carcinoma. Surveillance.

* Tracking Code: BO *
EXAM:
CT ABDOMEN AND PELVIS WITH CONTRAST
TECHNIQUE: Multidetector CT imaging of the abdomen and pelvis was performed
using the standard protocol following bolus administration of
intravenous contrast.

[Series 2: axial st · axial · 0.78mm/px · z∈[-463,-58]mm · 13 of 93 slices shown, 15 images]
[im 6/93  soft-tissue]
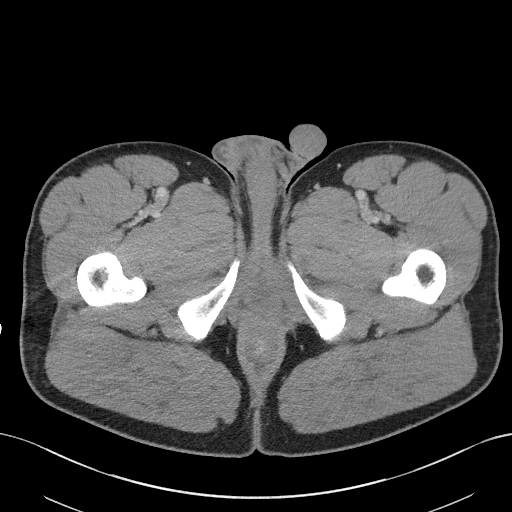
[im 6/93  bone]
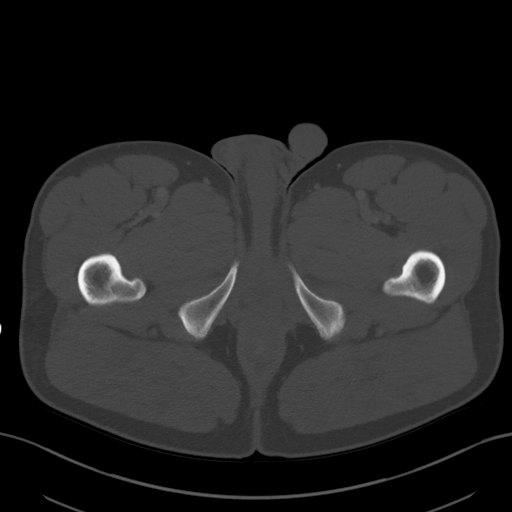
[im 11/93  soft-tissue]
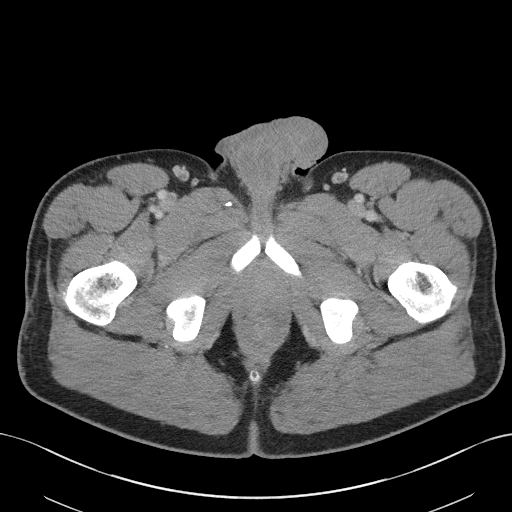
[im 22/93  soft-tissue]
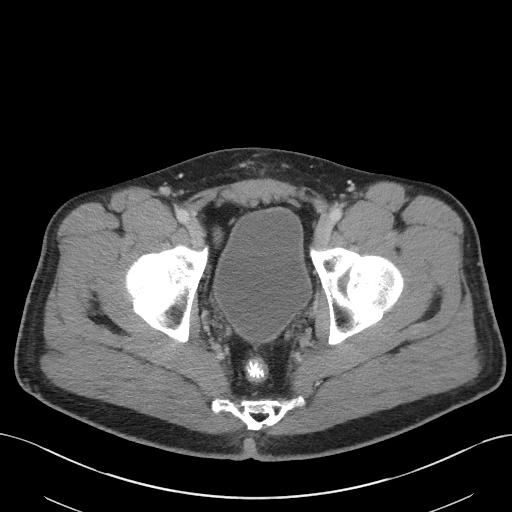
[im 28/93  soft-tissue]
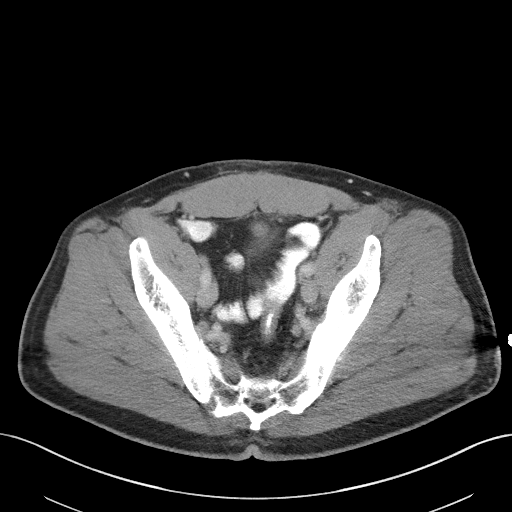
[im 33/93  soft-tissue]
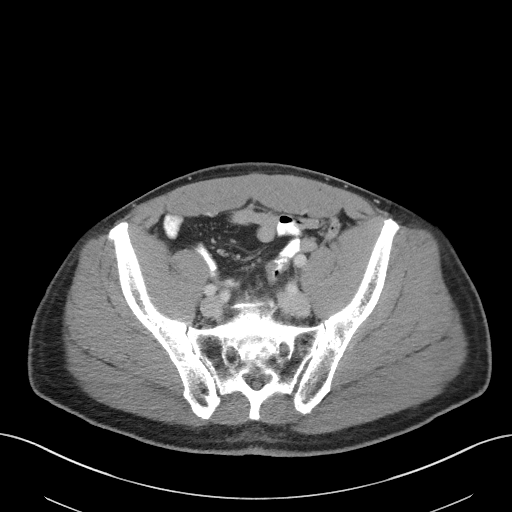
[im 38/93  soft-tissue]
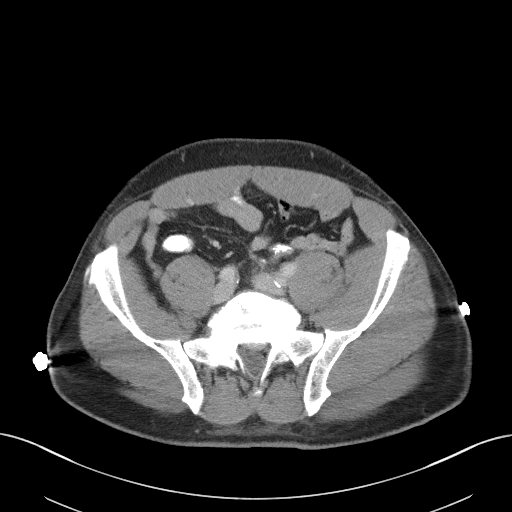
[im 49/93  soft-tissue]
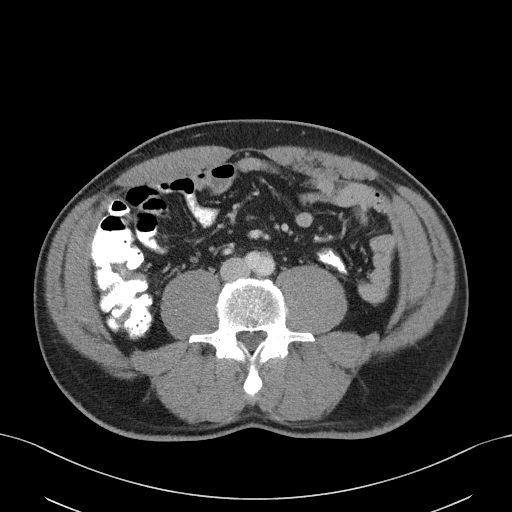
[im 55/93  soft-tissue]
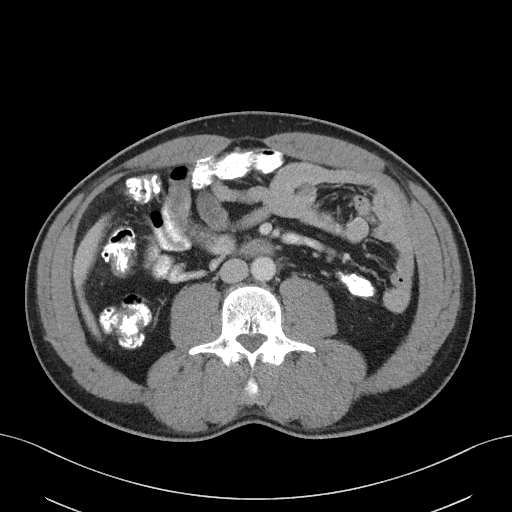
[im 60/93  soft-tissue]
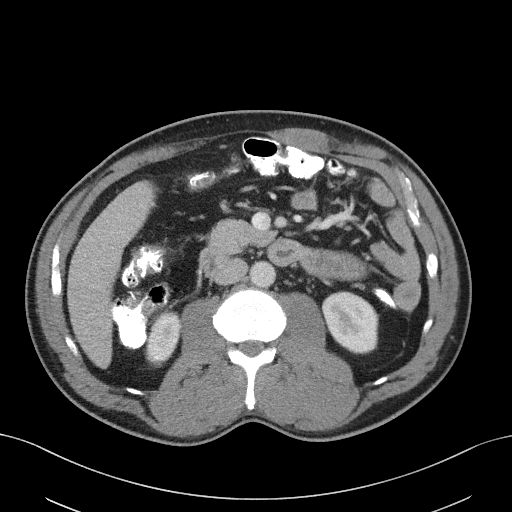
[im 60/93  bone]
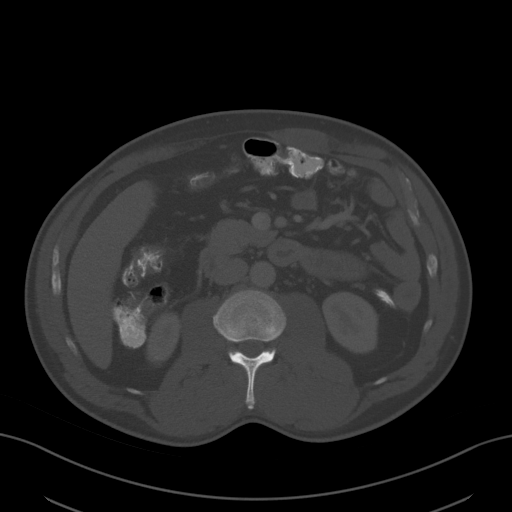
[im 65/93  soft-tissue]
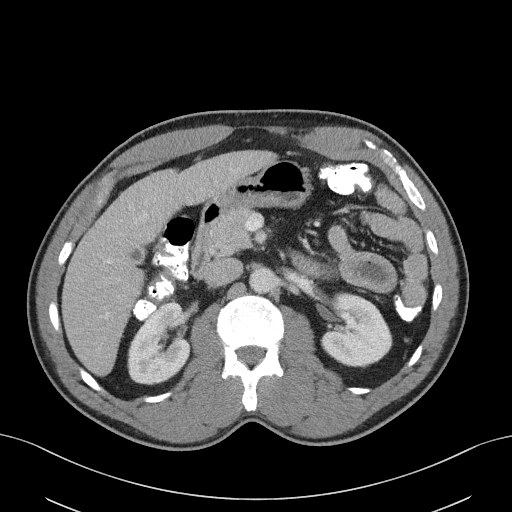
[im 71/93  soft-tissue]
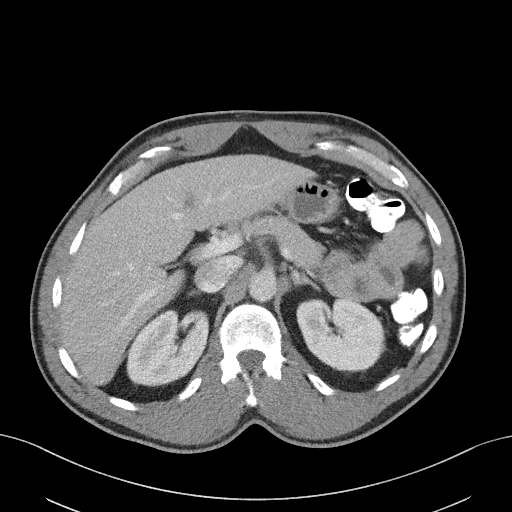
[im 82/93  soft-tissue]
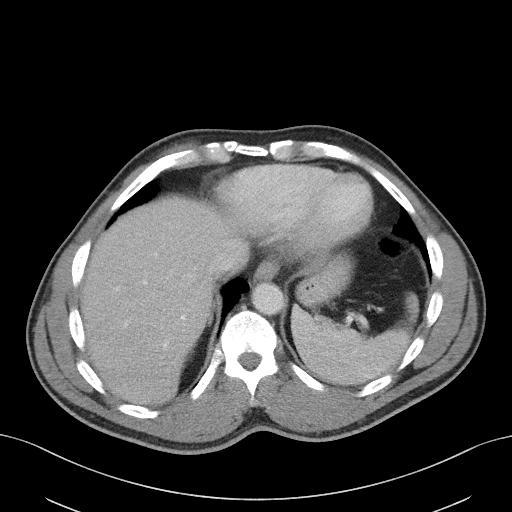
[im 87/93  soft-tissue]
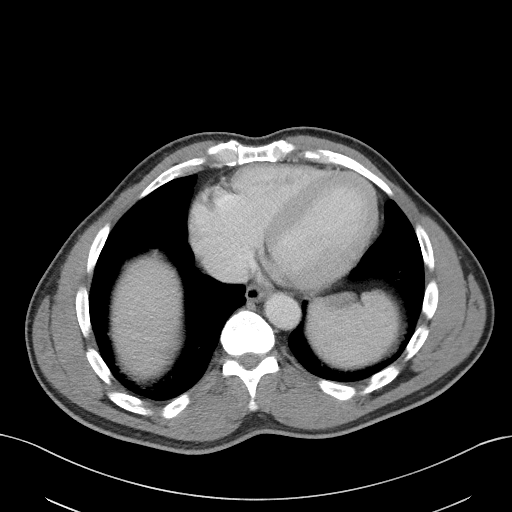

[Series 5: coronal st · coronal · 0.68mm/px · 3 of 95 slices shown]
[im 32/95  soft-tissue]
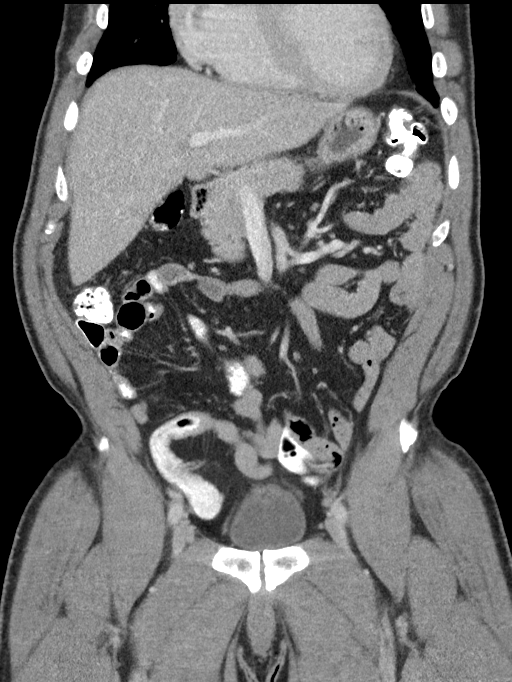
[im 42/95  soft-tissue]
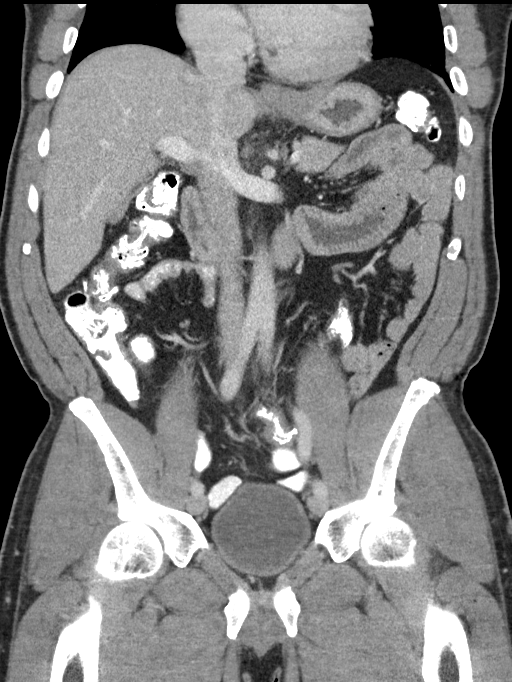
[im 53/95  soft-tissue]
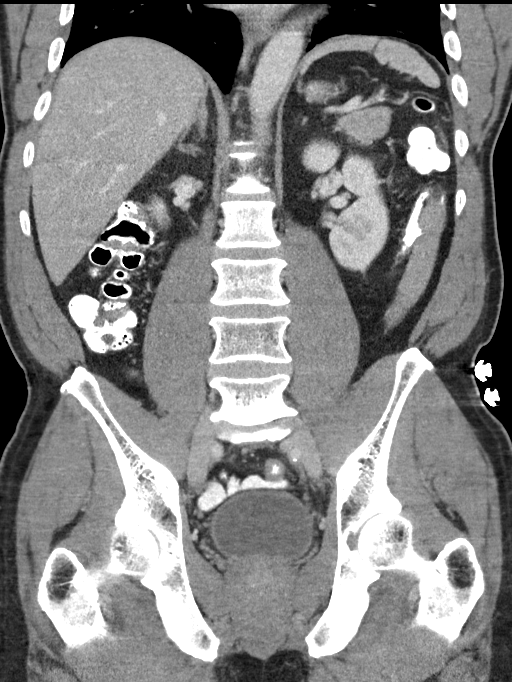

[16 of 46 positions shown; findings below may reference images not displayed]

RADIATION DOSE REDUCTION: This exam was performed according to the
departmental dose-optimization program which includes automated
exposure control, adjustment of the mA and/or kV according to
patient size and/or use of iterative reconstruction technique.

CONTRAST:  100mL OMNIPAQUE IOHEXOL 300 MG/ML  SOLN
FINDINGS: Lower Chest: No acute findings.

Hepatobiliary: No hepatic masses identified. Gallbladder is
unremarkable. No evidence of biliary ductal dilatation.

Pancreas:  No mass or inflammatory changes.

Spleen: Within normal limits in size and appearance.

Adrenals/Urinary Tract: No masses identified. No evidence of
ureteral calculi or hydronephrosis.

Stomach/Bowel: Surgical anastomosis noted in the sigmoid colon. No
mass identified. No evidence of obstruction, inflammatory process or
abnormal fluid collections.

Vascular/Lymphatic: No pathologically enlarged lymph nodes. No acute
vascular findings.

Reproductive:  No mass or other significant abnormality.

Other:  None.

Musculoskeletal:  No suspicious bone lesions identified.
IMPRESSION: Unremarkable exam. No evidence of recurrent or metastatic carcinoma
within the abdomen or pelvis.

## 2024-07-02 ENCOUNTER — Other Ambulatory Visit: Payer: Self-pay

## 2024-07-02 ENCOUNTER — Inpatient Hospital Stay: Admitting: Hematology

## 2024-07-02 ENCOUNTER — Inpatient Hospital Stay: Attending: Hematology

## 2024-07-02 ENCOUNTER — Encounter: Payer: Self-pay | Admitting: Hematology

## 2024-07-02 ENCOUNTER — Inpatient Hospital Stay

## 2024-07-02 ENCOUNTER — Telehealth: Payer: Self-pay

## 2024-07-02 VITALS — BP 136/78 | HR 63 | Temp 98.3°F | Resp 18 | Wt 193.2 lb

## 2024-07-02 DIAGNOSIS — C187 Malignant neoplasm of sigmoid colon: Secondary | ICD-10-CM

## 2024-07-02 DIAGNOSIS — M79605 Pain in left leg: Secondary | ICD-10-CM | POA: Diagnosis not present

## 2024-07-02 DIAGNOSIS — Z9221 Personal history of antineoplastic chemotherapy: Secondary | ICD-10-CM | POA: Insufficient documentation

## 2024-07-02 DIAGNOSIS — E538 Deficiency of other specified B group vitamins: Secondary | ICD-10-CM | POA: Insufficient documentation

## 2024-07-02 DIAGNOSIS — Z85038 Personal history of other malignant neoplasm of large intestine: Secondary | ICD-10-CM | POA: Diagnosis not present

## 2024-07-02 DIAGNOSIS — G620 Drug-induced polyneuropathy: Secondary | ICD-10-CM | POA: Diagnosis not present

## 2024-07-02 DIAGNOSIS — M25562 Pain in left knee: Secondary | ICD-10-CM

## 2024-07-02 DIAGNOSIS — Z9049 Acquired absence of other specified parts of digestive tract: Secondary | ICD-10-CM

## 2024-07-02 DIAGNOSIS — E860 Dehydration: Secondary | ICD-10-CM

## 2024-07-02 DIAGNOSIS — Z923 Personal history of irradiation: Secondary | ICD-10-CM | POA: Diagnosis not present

## 2024-07-02 LAB — CBC WITH DIFFERENTIAL (CANCER CENTER ONLY)
Abs Immature Granulocytes: 0.01 K/uL (ref 0.00–0.07)
Basophils Absolute: 0 K/uL (ref 0.0–0.1)
Basophils Relative: 1 %
Eosinophils Absolute: 0.2 K/uL (ref 0.0–0.5)
Eosinophils Relative: 3 %
HCT: 43.6 % (ref 39.0–52.0)
Hemoglobin: 14.6 g/dL (ref 13.0–17.0)
Immature Granulocytes: 0 %
Lymphocytes Relative: 33 %
Lymphs Abs: 1.5 K/uL (ref 0.7–4.0)
MCH: 29.4 pg (ref 26.0–34.0)
MCHC: 33.5 g/dL (ref 30.0–36.0)
MCV: 87.9 fL (ref 80.0–100.0)
Monocytes Absolute: 0.6 K/uL (ref 0.1–1.0)
Monocytes Relative: 12 %
Neutro Abs: 2.2 K/uL (ref 1.7–7.7)
Neutrophils Relative %: 51 %
Platelet Count: 234 K/uL (ref 150–400)
RBC: 4.96 MIL/uL (ref 4.22–5.81)
RDW: 13.2 % (ref 11.5–15.5)
WBC Count: 4.4 K/uL (ref 4.0–10.5)
nRBC: 0 % (ref 0.0–0.2)

## 2024-07-02 LAB — CMP (CANCER CENTER ONLY)
ALT: 9 U/L (ref 0–44)
AST: 17 U/L (ref 15–41)
Albumin: 4.4 g/dL (ref 3.5–5.0)
Alkaline Phosphatase: 95 U/L (ref 38–126)
Anion gap: 8 (ref 5–15)
BUN: 12 mg/dL (ref 6–20)
CO2: 27 mmol/L (ref 22–32)
Calcium: 9.2 mg/dL (ref 8.9–10.3)
Chloride: 107 mmol/L (ref 98–111)
Creatinine: 0.96 mg/dL (ref 0.61–1.24)
GFR, Estimated: >60 mL/min (ref >60)
Glucose, Bld: 85 mg/dL (ref 70–99)
Potassium: 3.7 mmol/L (ref 3.5–5.1)
Sodium: 142 mmol/L (ref 135–145)
Total Bilirubin: 0.5 mg/dL (ref 0.0–1.2)
Total Protein: 8 g/dL (ref 6.5–8.1)

## 2024-07-02 LAB — IRON AND IRON BINDING CAPACITY (CC-WL,HP ONLY)
Iron: 51 ug/dL (ref 45–182)
Saturation Ratios: 13 % — ABNORMAL LOW (ref 17.9–39.5)
TIBC: 406 ug/dL (ref 250–450)
UIBC: 355 ug/dL (ref 117–376)

## 2024-07-02 LAB — FERRITIN: Ferritin: 50 ng/mL (ref 24–336)

## 2024-07-02 MED ORDER — CYANOCOBALAMIN 1000 MCG/ML IJ SOLN: 1000.0000 ug | INTRAMUSCULAR | 5 refills | Status: AC

## 2024-07-02 MED ORDER — CYANOCOBALAMIN 1000 MCG/ML IJ SOLN
1000.0000 ug | Freq: Once | INTRAMUSCULAR | Status: AC
  Administered 2024-07-02: 1000 ug via INTRAMUSCULAR
  Filled 2024-07-02: qty 1

## 2024-07-02 NOTE — Patient Instructions (Signed)
 Vitamin B12 Deficiency Vitamin B12 deficiency means that your body does not have enough vitamin B12. The body needs this important vitamin: To make red blood cells. To make genes (DNA). To help the nerves work. If you do not have enough vitamin B12 in your body, you can have health problems, such as not having enough red blood cells in the blood (anemia). What are the causes? Not eating enough foods that contain vitamin B12. Not being able to take in (absorb) vitamin B12 from the food that you eat. Certain diseases. A condition in which the body does not make enough of a certain protein. This results in your body not taking in enough vitamin B12. Having a surgery in which part of the stomach or small intestine is taken out. Taking medicines that make it hard for the body to take in vitamin B12. These include: Heartburn medicines. Some medicines that are used to treat diabetes. What increases the risk? Being an older adult. Eating a vegetarian or vegan diet that does not include any foods that come from animals. Not eating enough foods that contain vitamin B12 while you are pregnant. Taking certain medicines. Having alcoholism. What are the signs or symptoms? In some cases, there are no symptoms. If the condition leads to too few blood cells or nerve damage, symptoms can occur, such as: Feeling weak or tired. Not being hungry. Losing feeling (numbness) or tingling in your hands and feet. Redness and burning of the tongue. Feeling sad (depressed). Confusion or memory problems. Trouble walking. If anemia is very bad, symptoms can include: Being short of breath. Being dizzy. Having a very fast heartbeat. How is this treated? Changing the way you eat and drink, such as: Eating more foods that contain vitamin B12. Drinking little or no alcohol. Getting vitamin B12 shots. Taking vitamin B12 supplements by mouth (orally). Your doctor will tell you the dose that is best for you. Follow  these instructions at home: Eating and drinking  Eat foods that come from animals and have a lot of vitamin B12 in them. These include: Meats and poultry. This includes beef, pork, chicken, [place], and organ meats, such as liver. Seafood, such as clams, rainbow trout, salmon, tuna, and haddock. Eggs. Dairy foods such as milk, yogurt, and cheese. Eat breakfast cereals that have vitamin B12 added to them (are fortified). Check the label. The items listed above may not be a complete list of foods and beverages you can eat and drink. Contact a dietitian for more information. Alcohol use Do not drink alcohol if: Your doctor tells you not to drink. You are pregnant, may be pregnant, or are planning to become pregnant. If you drink alcohol: Limit how much you have to: 0-1 drink a day for women. 0-2 drinks a day for men. Know how much alcohol is in your drink. In the [place] one drink equals one 12 oz bottle of beer (355 mL), one 5 oz glass of wine (148 mL), or one 1 oz glass of hard liquor (44 mL). General instructions Get any vitamin B12 shots if told by your doctor. Take supplements only as told by your doctor. Follow the directions. Keep all follow-up visits. Contact a doctor if: Your symptoms come back. Your symptoms get worse or do not get better with treatment. Get help right away if: You have trouble breathing. You have a very fast heartbeat. You have chest pain. You get dizzy. You faint. These symptoms may be an emergency. Get help right away. Call 911.  Do not wait to see if the symptoms will go away. Do not drive yourself to the hospital. Summary Vitamin B12 deficiency means that your body is not getting enough of the vitamin. In some cases, there are no symptoms of this condition. Treatment may include making a change in the way you eat and drink, getting shots, or taking supplements. Eat foods that have vitamin B12 in them. This information is not intended to replace advice  given to you by your health care provider. Make sure you discuss any questions you have with your health care provider. Document Revised: 06/03/2021 Document Reviewed: 06/03/2021 Elsevier Patient Education  2024 [employer].

## 2024-07-02 NOTE — Progress Notes (Signed)
 [name]   [phone] Fax:([phone]   Clinic Follow up Note   Patient Care Team: Patient, No Pcp Per as PCP - General (General Practice) [name], DO as Consulting Physician (Gastroenterology) [name], MD as Consulting Physician (General Surgery) [name], MD as Consulting Physician (Hematology) [name], [name], NP as Nurse Practitioner (Nurse Practitioner)  Date of Service:  07/02/2024  CHIEF COMPLAINT: f/u of colon cancer  CURRENT THERAPY:  Cancer surveillance B12 injection monthly at home  Oncology History   Malignant neoplasm of sigmoid colon (HCC)  pT3pN1bM0 stage IIIb, MMR normal, MSI-stable  -Diagnosed in November 2022, status post surgical resection by Dr. Teresa with positive radial margin. -He received 4 cycles adjuvant chemotherapy CapeOx, completed in 11/2021 -He subsequently received concurrent chemoradiation with Xeloda , completed in May 2023 -He is on cancer surveillance, doing well -CT from 10/19/2023 showed NED  Assessment & Plan Malignant neoplasm of sigmoid colon Under surveillance following diagnosis in November 2022. Previous colonoscopy in May 2024.  - Order follow-up scan in December 2025 - Schedule next colonoscopy for May 2027  Chemotherapy-induced peripheral neuropathy Experiencing numbness and tingling in toes, consistent with chemotherapy-induced peripheral neuropathy, affecting the right foot since chemotherapy treatment.  Left knee pain and swelling, possible inflammatory arthritis Left knee pain and swelling for nearly two weeks, with warmth and slight swelling. Differential diagnosis includes inflammatory arthritis, osteoarthritis, and less likely gout. No recent injury or travel. Pain extends to the shin. No signs of infection or fever. Previous cortisone injections administered. - Order Doppler ultrasound of left lower extremity to rule out blood clots - Refer to orthopedic surgeon  for further evaluation and management  Vitamin B12 deficiency Vitamin B12 deficiency with last level slightly low at 295. Last B12 injection in May 2024. He has not received monthly injections as recommended. No significant fatigue, and blood counts are normal. - Administer B12 injection today - Call in B12 prescription to pharmacy - Provide instruction on self-administration of B12 injections  Plan - Will restart a B12 injection today, patient agrees to the self injection at home once a day.  Prescription called in for him - Urgent referral to [name] for the left knee pain and edema - Doppler of left lower extremity to rule out DVT, scheduled for tomorrow - He otherwise doing well, no concern for cancer recurrence.  Will continue cancer surveillance - Follow-up in 3 months with lab and surveillance CT scan 1 week before   SUMMARY OF ONCOLOGIC HISTORY: Oncology History  Malignant neoplasm of sigmoid colon (HCC)  07/20/2021 Procedure   Diagnostic EGD by Dr. San - Normal esophagus. - Z-line regular, 40 cm from the incisors. - Normal stomach. Biopsied. - Normal examined duodenum. Biopsied. Colonoscopy - Hemorrhoids found on perianal exam. - Likely malignant completely obstructing tumor in the sigmoid colon. Biopsied. Tattooed. - Non-bleeding internal hemorrhoids.   07/20/2021 Initial Biopsy   Diagnosis 1. Duodenum, Biopsy - BENIGN DUODENAL MUCOSA - NO ACUTE INFLAMMATION, VILLOUS BLUNTING OR INCREASED INTRAEPITHELIAL LYMPHOCYTES 2. Stomach, biopsy - MILD CHRONIC GASTRITIS WITH REACTIVE CHANGES - NO H. PYLORI OR INTESTINAL METAPLASIA IDENTIFIED - SEE COMMENT 3. Sigmoid Colon Biopsy - ADENOCARCINOMA - SEE COMMENT Microscopic Comment 2. H. pylori immunohistochemistry is NEGATIVE for microorganisms.   07/20/2021 Tumor Marker   CEA normal 3.2   07/28/2021 Imaging   Staging CT CAP IMPRESSION: 1. Circumferential wall thickening of the distal sigmoid colon, in keeping  with primary colon malignancy identified by colonoscopy. 2. There  is some nodularity in the adjacent mesocolon about the inferior mesenteric artery, as well as prominent subcentimeter lymph nodes, worrisome for early nodal or soft tissue metastatic disease. 3. No evidence of distant metastatic disease in the chest, abdomen, or pelvis.   08/31/2021 Cancer Staging   Staging form: Colon and Rectum, AJCC 8th Edition - Pathologic stage from 08/31/2021: Stage IIIB (pT3, pN1b, cM0) - Signed by [name], [name], NP on 09/19/2021 Stage prefix: Initial diagnosis Histologic grading system: 4 grade system Histologic grade (G): G2 Laterality: Left Lymph-vascular invasion (LVI): LVI present/identified, NOS Carcinoembryonic antigen (CEA) (ng/mL): 3.2 Perineural invasion (PNI): Absent   08/31/2021 Definitive Surgery   PREOP DIAGNOSIS: sigmoid colon cancer POSTOP DIAGNOSIS: Rectosigmoid colon cancer PROCEDURE:  Robotic assisted low anterior resection with double stapled colorectal anastomosis Intraoperative assessment of perfusion (ICG) Flexible sigmoidoscopy Bilateral transversus abdominus plane blocks SURGEON: Lonni HERO. Teresa, MD ASSISTANT: Bernarda Ned, MD   08/31/2021 Pathology Results   FINAL MICROSCOPIC DIAGNOSIS: A. COLON, RECTOSIGMOID, RESECTION: - Invasive moderately differentiated adenocarcinoma, 4 cm, involving distal sigmoid colon - Carcinoma invades into subserosa - Inked radial margin is positive for carcinoma - Metastatic carcinoma to two of twenty-two lymph nodes (2/22) - See oncology table B. FINAL DISTAL MARGIN: - Colonic donut, negative for carcinoma pT3pN1b, stage IIIb MMR IHC normal MSI-stable   09/19/2021 Initial Diagnosis   Malignant neoplasm of sigmoid colon (HCC)   09/30/2021 - 12/02/2021 Chemotherapy   Patient is on Treatment Plan : COLORECTAL Xelox (Capeox) q21d     01/04/2022 Procedure   Colonoscopy, Dr. San  Impression: - One 5 mm polyp in the  proximal transverse colon, removed with a cold snare. Resected and retrieved. - Patent end-to-end low-anterior anastomosis, located 20 cm from the anal verge, characterized by healthy appearing mucosa. Biopsied. - One 3 mm polyp in the rectosigmoid, removed with a cold snare. Resected and retrieved. - Non-bleeding internal hemorrhoids. - Stool in the entire examined colon.   01/04/2022 Pathology Results   Diagnosis 1. Surgical [P], colon, transverse, polyp (1) - TUBULAR ADENOMA WITHOUT HIGH-GRADE DYSPLASIA. 2. Surgical [P], colon, rectum - COLONIC MUCOSA WITH CHANGES CONSISTENT WITH MUCOSAL PROLAPSE. - NO ADENOMATOUS CHANGE OR MALIGNANCY. 3. Surgical [P], colon, rectum, polyp (1) - HYPERPLASTIC POLYP (S).    Imaging     09/28/2022 Imaging    IMPRESSION: 1. Post sigmoid resection. No evidence of recurrent or metastatic disease in the chest, abdomen or pelvis. 2. Aortic atherosclerosis.   03/01/2023 Procedure   Colonoscopy -single 4 mm polyp in ascending colon  -patent, end-to-end colo-colonic anastomosis with healthy appearing mucosa  -non-bleeding, internal hemorrhoids.  -recommend repeat colonoscopy in 3 years    04/04/2023 Imaging   CT chest, abdomen, and pelvis with contrast  IMPRESSION: 1. Sigmoid colon resection and reanastomosis. 2. No evidence of recurrent or metastatic disease in the chest, abdomen, or pelvis. 3. No CT findings to explain chest pain. 4. Mild prostatomegaly.      Discussed the use of AI scribe software for clinical note transcription with the patient, who gave verbal consent to proceed.  History of Present Illness Ethan Martin is a 58 year old male with colon cancer who presents for follow-up.  Diagnosed with colon cancer in November 2022. Underwent a normal colonoscopy in May 2024. Last scan in December 2024, with the next scheduled for December 2025. No issues with bowel movements, pain, bloating, or stomach issues.  Has not received B12 injections  as scheduled, with the last injection in May, four months ago.  B12 level was slightly low at 295 during the last check. No fatigue reported.  Experiences numbness and tingling in toes, associated with previous chemotherapy treatment. Sensation present in the right foot, described as neuropathy.     All other systems were reviewed with the patient and are negative.  MEDICAL HISTORY:  Past Medical History:  Diagnosis Date   Anemia 10/2021   IDA r/o Chemo, colon ca dx, Vitamin B12 deficiency   Arthritis    Cancer (HCC) 07/2021   Colon   Cataract    LEFT EYE   GERD (gastroesophageal reflux disease)     SURGICAL HISTORY: Past Surgical History:  Procedure Laterality Date   COLON RESECTION  08/31/2021   Colon ca, Dr Raiford   COLONOSCOPY  01/04/2022   COLONOSCOPY WITH PROPOFOL   06/2021   FLEXIBLE SIGMOIDOSCOPY N/A 08/31/2021   Procedure: FLEXIBLE SIGMOIDOSCOPY;  Surgeon: [name], MD;  Location: [facility];  Service: General;  Laterality: N/A;   NOSE SURGERY     WISDOM TOOTH EXTRACTION     1988    I have reviewed the social history and family history with the patient and they are unchanged from previous note.  ALLERGIES:  has no known allergies.  MEDICATIONS:  Current Outpatient Medications  Medication Sig Dispense Refill   cyanocobalamin  (VITAMIN B12) 1000 MCG/ML injection Inject 1 mL (1,000 mcg total) into the muscle every 30 (thirty) days. 1 mL 5   No current facility-administered medications for this visit.    PHYSICAL EXAMINATION: ECOG PERFORMANCE STATUS: 1 - Symptomatic but completely ambulatory  Vitals:   07/02/24 0902  BP: 136/78  Pulse: 63  Resp: 18  Temp: 98.3 F (36.8 C)  SpO2: 97%   Wt Readings from Last 3 Encounters:  07/02/24 193 lb 3.2 oz (87.6 kg)  02/28/24 198 lb 1.6 oz (89.9 kg)  10/31/23 201 lb 8 oz (91.4 kg)     GENERAL:alert, no distress and comfortable SKIN: skin color, texture, turgor are normal, no rashes or significant  lesions EYES: normal, Conjunctiva are pink and non-injected, sclera clear NECK: supple, thyroid normal size, non-tender, without nodularity LYMPH:  no palpable lymphadenopathy in the cervical, axillary  LUNGS: clear to auscultation and percussion with normal breathing effort HEART: regular rate & rhythm and no murmurs and no lower extremity edema ABDOMEN:abdomen soft, non-tender and normal bowel sounds Musculoskeletal:no cyanosis of digits and no clubbing.  Left knee slightly swollen and warm. NEURO: alert & oriented x 3 with fluent speech, no focal motor/sensory deficits  Physical Exam   LABORATORY DATA:  I have reviewed the data as listed    Latest Ref Rng & Units 07/02/2024    8:44 AM 02/28/2024    8:44 AM 10/31/2023   10:07 AM  CBC  WBC 4.0 - 10.5 K/uL 4.4  3.7  4.9   Hemoglobin 13.0 - 17.0 g/dL 85.3  85.0  85.0   Hematocrit 39.0 - 52.0 % 43.6  44.3  45.1   Platelets 150 - 400 K/uL 234  213  219         Latest Ref Rng & Units 07/02/2024    8:44 AM 02/28/2024    8:44 AM 10/31/2023   10:07 AM  CMP  Glucose 70 - 99 mg/dL 85  888  82   BUN 6 - 20 mg/dL 12  11  11    Creatinine 0.61 - 1.24 mg/dL 9.03  9.01  9.00   Sodium 135 - 145 mmol/L 142  140  141  Potassium 3.5 - 5.1 mmol/L 3.7  3.7  3.9   Chloride 98 - 111 mmol/L 107  103  105   CO2 22 - 32 mmol/L 27  29  30    Calcium 8.9 - 10.3 mg/dL 9.2  9.1  9.3   Total Protein 6.5 - 8.1 g/dL 8.0  8.0  7.4   Total Bilirubin 0.0 - 1.2 mg/dL 0.5  0.6  0.6   Alkaline Phos 38 - 126 U/L 95  86  89   AST 15 - 41 U/L 17  18  17    ALT 0 - 44 U/L 9  13  12        RADIOGRAPHIC STUDIES: I have personally reviewed the radiological images as listed and agreed with the findings in the report. No results found.    Orders Placed This Encounter  Procedures   CT CHEST ABDOMEN PELVIS W CONTRAST    Standing Status:   Future    Expected Date:   09/23/2024    Expiration Date:   07/02/2025    If indicated for the ordered procedure, I authorize the  administration of contrast media per Radiology protocol:   Yes    Does the patient have a contrast media/X-ray dye allergy?:   No    Preferred imaging location?:   [facility]    If indicated for the ordered procedure, I authorize the administration of oral contrast media per Radiology protocol:   Yes   AMB referral to orthopedics    Referral Priority:   Urgent    Referral Type:   Consultation    Number of Visits Requested:   1   All questions were answered. The patient knows to call the clinic with any problems, questions or concerns. No barriers to learning was detected. The total time spent in the appointment was 30 minutes, including review of chart and various tests results, discussions about plan of care and coordination of care plan     Onita Mattock, MD 07/02/2024

## 2024-07-02 NOTE — Assessment & Plan Note (Signed)
 pT3pN1bM0 stage IIIb, MMR normal, MSI-stable  -Diagnosed in November 2022, status post surgical resection by Dr. Camilo Cella with positive radial margin. -He received 4 cycles adjuvant chemotherapy CapeOx, completed in 11/2021 -He subsequently received concurrent chemoradiation with Xeloda , completed in May 2023 -He is on cancer surveillance, doing well -CT from 10/19/2023 showed NED

## 2024-07-02 NOTE — Telephone Encounter (Signed)
 Pt called stating he would like to talk to Dr. Lanny regarding his labs preferably saturation ratio.  Pt also wanted to let Dr. Lanny know that he was unable to schedule his appts d/t scheduler stating Dr. Lanny didn't put in a LOS.  Stated the scheduler will give pt a call to get him scheduled for his future appts.  Stated Dr. Feng's scheduler will give the pt a call to get him scheduled for his future appts.  Stated Dr. Lanny is currently seeing pts in clinic and this nurse will make her aware of the pt's call and concerns.  Stated Dr. Lanny will give the pt a call regarding his question/s.

## 2024-07-03 ENCOUNTER — Ambulatory Visit (HOSPITAL_COMMUNITY)
Admission: RE | Admit: 2024-07-03 | Discharge: 2024-07-03 | Disposition: A | Discharge status: Home / Self Care | Source: Ambulatory Visit | Attending: Hematology | Admitting: Hematology

## 2024-07-03 ENCOUNTER — Telehealth: Payer: Self-pay | Admitting: Hematology

## 2024-07-03 DIAGNOSIS — M79605 Pain in left leg: Secondary | ICD-10-CM | POA: Diagnosis present

## 2024-07-03 NOTE — Telephone Encounter (Signed)
 Called pt and spoke with regarding  his upcoming appts.

## 2024-07-04 ENCOUNTER — Ambulatory Visit: Payer: Self-pay | Admitting: Nurse Practitioner

## 2024-07-04 NOTE — Telephone Encounter (Addendum)
 Attempted to contact the patient via telephone call. Unable to reach the patient. LVM on patient T#[phone]. Spoke w/ patient's daughter, Rockland Kotarski. Went over provider's comments from below.  ----- Message from [name] [name] sent at 07/04/2024 10:00 AM EDT ----- Please let pt know doppler is negative, no DVT.  Thanks Lacie NP ----- Message ----- From: Interface, [employer] Sent: 07/03/2024   1:07 PM EDT To: Onita Mattock, MD

## 2024-07-17 ENCOUNTER — Other Ambulatory Visit: Payer: Self-pay

## 2024-07-17 MED ORDER — SYRINGE 25G X 1-1/2" 3 ML MISC: 1.0000 | 1 refills | Status: AC

## 2024-09-23 ENCOUNTER — Ambulatory Visit (HOSPITAL_COMMUNITY)
Admission: RE | Admit: 2024-09-23 | Discharge: 2024-09-23 | Disposition: A | Source: Ambulatory Visit | Attending: Hematology

## 2024-09-23 DIAGNOSIS — C187 Malignant neoplasm of sigmoid colon: Secondary | ICD-10-CM | POA: Diagnosis present

## 2024-09-23 MED ORDER — SODIUM CHLORIDE (PF) 0.9 % IJ SOLN
INTRAMUSCULAR | Status: AC
  Filled 2024-09-23: qty 50

## 2024-09-23 MED ORDER — IOHEXOL 300 MG/ML  SOLN
100.0000 mL | Freq: Once | INTRAMUSCULAR | Status: AC | PRN
  Administered 2024-09-23: 100 mL via INTRAVENOUS

## 2024-09-23 MED ORDER — IOHEXOL 9 MG/ML PO SOLN
1000.0000 mL | ORAL | Status: AC
  Administered 2024-09-23: 1000 mL via ORAL

## 2024-09-25 ENCOUNTER — Other Ambulatory Visit: Payer: Self-pay

## 2024-09-25 DIAGNOSIS — C187 Malignant neoplasm of sigmoid colon: Secondary | ICD-10-CM

## 2024-09-28 NOTE — Assessment & Plan Note (Signed)
 pT3pN1bM0 stage IIIb, MMR normal, MSI-stable  -Diagnosed in November 2022, status post surgical resection by Dr. Camilo Cella with positive radial margin. -He received 4 cycles adjuvant chemotherapy CapeOx, completed in 11/2021 -He subsequently received concurrent chemoradiation with Xeloda , completed in May 2023 -He is on cancer surveillance, doing well -CT from 10/19/2023 showed NED

## 2024-09-29 ENCOUNTER — Inpatient Hospital Stay

## 2024-09-29 ENCOUNTER — Inpatient Hospital Stay: Attending: Hematology

## 2024-09-29 ENCOUNTER — Inpatient Hospital Stay: Admitting: Hematology

## 2024-09-29 ENCOUNTER — Other Ambulatory Visit: Payer: Self-pay

## 2024-09-29 VITALS — BP 134/78 | HR 68 | Temp 97.7°F | Resp 17 | Wt 200.5 lb

## 2024-09-29 DIAGNOSIS — Z85038 Personal history of other malignant neoplasm of large intestine: Secondary | ICD-10-CM | POA: Diagnosis not present

## 2024-09-29 DIAGNOSIS — D519 Vitamin B12 deficiency anemia, unspecified: Secondary | ICD-10-CM | POA: Insufficient documentation

## 2024-09-29 DIAGNOSIS — D518 Other vitamin B12 deficiency anemias: Secondary | ICD-10-CM

## 2024-09-29 DIAGNOSIS — Z923 Personal history of irradiation: Secondary | ICD-10-CM | POA: Insufficient documentation

## 2024-09-29 DIAGNOSIS — E860 Dehydration: Secondary | ICD-10-CM

## 2024-09-29 DIAGNOSIS — C187 Malignant neoplasm of sigmoid colon: Secondary | ICD-10-CM

## 2024-09-29 DIAGNOSIS — Z9221 Personal history of antineoplastic chemotherapy: Secondary | ICD-10-CM | POA: Diagnosis not present

## 2024-09-29 DIAGNOSIS — Z9049 Acquired absence of other specified parts of digestive tract: Secondary | ICD-10-CM

## 2024-09-29 LAB — CMP (CANCER CENTER ONLY)
ALT: 13 U/L (ref 0–44)
AST: 25 U/L (ref 15–41)
Albumin: 4.4 g/dL (ref 3.5–5.0)
Alkaline Phosphatase: 92 U/L (ref 38–126)
Anion gap: 10 (ref 5–15)
BUN: 11 mg/dL (ref 6–20)
CO2: 26 mmol/L (ref 22–32)
Calcium: 9 mg/dL (ref 8.9–10.3)
Chloride: 105 mmol/L (ref 98–111)
Creatinine: 0.89 mg/dL (ref 0.61–1.24)
GFR, Estimated: >60 mL/min (ref >60)
Glucose, Bld: 93 mg/dL (ref 70–99)
Potassium: 4.2 mmol/L (ref 3.5–5.1)
Sodium: 142 mmol/L (ref 135–145)
Total Bilirubin: 0.5 mg/dL (ref 0.0–1.2)
Total Protein: 7.9 g/dL (ref 6.5–8.1)

## 2024-09-29 LAB — CBC WITH DIFFERENTIAL (CANCER CENTER ONLY)
Abs Immature Granulocytes: 0.01 K/uL (ref 0.00–0.07)
Basophils Absolute: 0 K/uL (ref 0.0–0.1)
Basophils Relative: 1 %
Eosinophils Absolute: 0.1 K/uL (ref 0.0–0.5)
Eosinophils Relative: 3 %
HCT: 43.2 % (ref 39.0–52.0)
Hemoglobin: 14.4 g/dL (ref 13.0–17.0)
Immature Granulocytes: 0 %
Lymphocytes Relative: 36 %
Lymphs Abs: 1.6 K/uL (ref 0.7–4.0)
MCH: 29.3 pg (ref 26.0–34.0)
MCHC: 33.3 g/dL (ref 30.0–36.0)
MCV: 87.8 fL (ref 80.0–100.0)
Monocytes Absolute: 0.6 K/uL (ref 0.1–1.0)
Monocytes Relative: 14 %
Neutro Abs: 2 K/uL (ref 1.7–7.7)
Neutrophils Relative %: 46 %
Platelet Count: 204 K/uL (ref 150–400)
RBC: 4.92 MIL/uL (ref 4.22–5.81)
RDW: 12.9 % (ref 11.5–15.5)
WBC Count: 4.5 K/uL (ref 4.0–10.5)
nRBC: 0 % (ref 0.0–0.2)

## 2024-09-29 LAB — FERRITIN: Ferritin: 43 ng/mL (ref 24–336)

## 2024-09-29 LAB — IRON AND IRON BINDING CAPACITY (CC-WL,HP ONLY)
Iron: 85 ug/dL (ref 45–182)
Saturation Ratios: 21 % (ref 17.9–39.5)
TIBC: 407 ug/dL (ref 250–450)
UIBC: 323 ug/dL

## 2024-09-29 LAB — VITAMIN B12: Vitamin B-12: 292 pg/mL (ref 180–914)

## 2024-09-29 MED ORDER — CYANOCOBALAMIN 1000 MCG/ML IJ SOLN
1000.0000 ug | Freq: Once | INTRAMUSCULAR | Status: AC
  Administered 2024-09-29: 1000 ug via INTRAMUSCULAR
  Filled 2024-09-29: qty 1

## 2024-09-29 NOTE — Progress Notes (Signed)
 [facility]   [phone] [phone]) [phone]   Clinic Follow up Note   Patient Care Team: Patient, No Pcp Per as PCP - General (General Practice) [name], DO as Consulting Physician (Gastroenterology) [name], MD as Consulting Physician (General Surgery) [name], MD as Consulting Physician (Hematology) [name], [name], NP as Nurse Practitioner (Nurse Practitioner)  Date of Service:  09/29/2024  CHIEF COMPLAINT: f/u of colon cancer  CURRENT THERAPY:  Cancer surveillance B12 injection monthly  Oncology History   Malignant neoplasm of sigmoid colon (HCC)  pT3pN1bM0 stage IIIb, MMR normal, MSI-stable  -Diagnosed in November 2022, status post surgical resection by Dr. Teresa with positive radial margin. -He received 4 cycles adjuvant chemotherapy CapeOx, completed in 11/2021 -He subsequently received concurrent chemoradiation with Xeloda , completed in May 2023 -He is on cancer surveillance, doing well -CT from 10/19/2023 showed NED  Assessment & Plan Colorectal cancer, status post sigmoid resection, surveillance Colorectal cancer diagnosed in 2022, status post sigmoid resection. Surveillance scan performed, awaiting radiologist's report. No current symptoms such as abdominal pain, bloating, or bowel movement issues. Risk of recurrence is lower after three years post-diagnosis. Previous colonoscopy in May 2024, next due in 2027. No high-risk factors for recurrence identified. - Await radiologist's report on recent scan - Scheduled follow-up in six months for routine surveillance - Plan for next colonoscopy in 2027  Vitamin B12 deficiency anemia Previous low B12 levels improved with injections. Current B12 level is 295, target is around 300. Symptoms of deficiency include anemia, fatigue, shortness of breath, and neuropathy. B12 injections are essential for maintaining adequate levels and preventing symptoms. - Administered B12  injection today - Scheduled monthly B12 injections - Will check B12 level in six months  Plan - He is clinically doing well, lab and CT scan reviewed, no evidence of recurrence - Will switch his B12 injection back to office, and schedule monthly - Follow-up in 6 months with labs   SUMMARY OF ONCOLOGIC HISTORY: Oncology History  Malignant neoplasm of sigmoid colon (HCC)  07/20/2021 Procedure   Diagnostic EGD by Dr. San - Normal esophagus. - Z-line regular, 40 cm from the incisors. - Normal stomach. Biopsied. - Normal examined duodenum. Biopsied. Colonoscopy - Hemorrhoids found on perianal exam. - Likely malignant completely obstructing tumor in the sigmoid colon. Biopsied. Tattooed. - Non-bleeding internal hemorrhoids.   07/20/2021 Initial Biopsy   Diagnosis 1. Duodenum, Biopsy - BENIGN DUODENAL MUCOSA - NO ACUTE INFLAMMATION, VILLOUS BLUNTING OR INCREASED INTRAEPITHELIAL LYMPHOCYTES 2. Stomach, biopsy - MILD CHRONIC GASTRITIS WITH REACTIVE CHANGES - NO H. PYLORI OR INTESTINAL METAPLASIA IDENTIFIED - SEE COMMENT 3. Sigmoid Colon Biopsy - ADENOCARCINOMA - SEE COMMENT Microscopic Comment 2. H. pylori immunohistochemistry is NEGATIVE for microorganisms.   07/20/2021 Tumor Marker   CEA normal 3.2   07/28/2021 Imaging   Staging CT CAP IMPRESSION: 1. Circumferential wall thickening of the distal sigmoid colon, in keeping with primary colon malignancy identified by colonoscopy. 2. There is some nodularity in the adjacent mesocolon about the inferior mesenteric artery, as well as prominent subcentimeter lymph nodes, worrisome for early nodal or soft tissue metastatic disease. 3. No evidence of distant metastatic disease in the chest, abdomen, or pelvis.   08/31/2021 Cancer Staging   Staging form: Colon and Rectum, AJCC 8th Edition - Pathologic stage from 08/31/2021: Stage IIIB (pT3, pN1b, cM0) - Signed by [name], [name], NP on 09/19/2021 Stage prefix: Initial  diagnosis Histologic grading system: 4 grade system Histologic grade (G): G2 Laterality:  Left Lymph-vascular invasion (LVI): LVI present/identified, NOS Carcinoembryonic antigen (CEA) (ng/mL): 3.2 Perineural invasion (PNI): Absent   08/31/2021 Definitive Surgery   PREOP DIAGNOSIS: sigmoid colon cancer POSTOP DIAGNOSIS: Rectosigmoid colon cancer PROCEDURE:  Robotic assisted low anterior resection with double stapled colorectal anastomosis Intraoperative assessment of perfusion (ICG) Flexible sigmoidoscopy Bilateral transversus abdominus plane blocks SURGEON: Lonni HERO. Teresa, MD ASSISTANT: Bernarda Ned, MD   08/31/2021 Pathology Results   FINAL MICROSCOPIC DIAGNOSIS: A. COLON, RECTOSIGMOID, RESECTION: - Invasive moderately differentiated adenocarcinoma, 4 cm, involving distal sigmoid colon - Carcinoma invades into subserosa - Inked radial margin is positive for carcinoma - Metastatic carcinoma to two of twenty-two lymph nodes (2/22) - See oncology table B. FINAL DISTAL MARGIN: - Colonic donut, negative for carcinoma pT3pN1b, stage IIIb MMR IHC normal MSI-stable   09/19/2021 Initial Diagnosis   Malignant neoplasm of sigmoid colon (HCC)   09/30/2021 - 12/02/2021 Chemotherapy   Patient is on Treatment Plan : COLORECTAL Xelox (Capeox) q21d     01/04/2022 Procedure   Colonoscopy, Dr. San  Impression: - One 5 mm polyp in the proximal transverse colon, removed with a cold snare. Resected and retrieved. - Patent end-to-end low-anterior anastomosis, located 20 cm from the anal verge, characterized by healthy appearing mucosa. Biopsied. - One 3 mm polyp in the rectosigmoid, removed with a cold snare. Resected and retrieved. - Non-bleeding internal hemorrhoids. - Stool in the entire examined colon.   01/04/2022 Pathology Results   Diagnosis 1. Surgical [P], colon, transverse, polyp (1) - TUBULAR ADENOMA WITHOUT HIGH-GRADE DYSPLASIA. 2. Surgical [P], colon, rectum -  COLONIC MUCOSA WITH CHANGES CONSISTENT WITH MUCOSAL PROLAPSE. - NO ADENOMATOUS CHANGE OR MALIGNANCY. 3. Surgical [P], colon, rectum, polyp (1) - HYPERPLASTIC POLYP (S).    Imaging     09/28/2022 Imaging    IMPRESSION: 1. Post sigmoid resection. No evidence of recurrent or metastatic disease in the chest, abdomen or pelvis. 2. Aortic atherosclerosis.   03/01/2023 Procedure   Colonoscopy -single 4 mm polyp in ascending colon  -patent, end-to-end colo-colonic anastomosis with healthy appearing mucosa  -non-bleeding, internal hemorrhoids.  -recommend repeat colonoscopy in 3 years    04/04/2023 Imaging   CT chest, abdomen, and pelvis with contrast  IMPRESSION: 1. Sigmoid colon resection and reanastomosis. 2. No evidence of recurrent or metastatic disease in the chest, abdomen, or pelvis. 3. No CT findings to explain chest pain. 4. Mild prostatomegaly.      Discussed the use of AI scribe software for clinical note transcription with the patient, who gave verbal consent to proceed.  History of Present Illness He is a 58 year old male with colon cancer who presents for follow-up.  He has intermittent non-radiating aching pain in his leg. Location is vague.  He cannot self-administer B12 injections and receives them in clinic. He brought his B12 medication today. His B12 level was low in the past but improved with injections. The last B12 level was 295 about seven months ago. His last B12 injection was at his previous visit.  He recently had a scan completed. The radiology read is still pending.  He was diagnosed with colon cancer in 2022. He had a colonoscopy in May 2024 and the next is planned for 2027. He is worried this interval is too long and that his cancer could recur as before. He does not recall prior genetic testing and is unsure of his detailed family cancer history, though he thinks some relatives had cancer.  He has no stomach pain, bloating, or bowel movement  changes.     All other systems were reviewed with the patient and are negative.  MEDICAL HISTORY:  Past Medical History:  Diagnosis Date   Anemia 10/2021   IDA r/o Chemo, colon ca dx, Vitamin B12 deficiency   Arthritis    Cancer (HCC) 07/2021   Colon   Cataract    LEFT EYE   GERD (gastroesophageal reflux disease)     SURGICAL HISTORY: Past Surgical History:  Procedure Laterality Date   COLON RESECTION  08/31/2021   Colon ca, Dr Raiford   COLONOSCOPY  01/04/2022   COLONOSCOPY WITH PROPOFOL   06/2021   FLEXIBLE SIGMOIDOSCOPY N/A 08/31/2021   Procedure: FLEXIBLE SIGMOIDOSCOPY;  Surgeon: [name], MD;  Location: [facility];  Service: General;  Laterality: N/A;   NOSE SURGERY     WISDOM TOOTH EXTRACTION     1988    I have reviewed the social history and family history with the patient and they are unchanged from previous note.  ALLERGIES:  has no known allergies.  MEDICATIONS:  Current Outpatient Medications  Medication Sig Dispense Refill   cyanocobalamin  (VITAMIN B12) 1000 MCG/ML injection Inject 1 mL (1,000 mcg total) into the muscle every 30 (thirty) days. 1 mL 5   Syringe/Needle, Disp, (SYRINGE 3CC/25GX1-1/2) 25G X 1-1/2 3 ML MISC 1 Syringe by Does not apply route every 30 (thirty) days. Pt does B12 injections at home 12 each 1   No current facility-administered medications for this visit.    PHYSICAL EXAMINATION: ECOG PERFORMANCE STATUS: 0 - Asymptomatic  Vitals:   09/29/24 0901  BP: 134/78  Pulse: 68  Resp: 17  Temp: 97.7 F (36.5 C)  SpO2: 98%   Wt Readings from Last 3 Encounters:  09/29/24 200 lb 8 oz (90.9 kg)  07/02/24 193 lb 3.2 oz (87.6 kg)  02/28/24 198 lb 1.6 oz (89.9 kg)     GENERAL:alert, no distress and comfortable SKIN: skin color, texture, turgor are normal, no rashes or significant lesions EYES: normal, Conjunctiva are pink and non-injected, sclera clear NECK: supple, thyroid normal size, non-tender, without  nodularity LYMPH:  no palpable lymphadenopathy in the cervical, axillary  LUNGS: clear to auscultation and percussion with normal breathing effort HEART: regular rate & rhythm and no murmurs and no lower extremity edema ABDOMEN:abdomen soft, non-tender and normal bowel sounds Musculoskeletal:no cyanosis of digits and no clubbing  NEURO: alert & oriented x 3 with fluent speech, no focal motor/sensory deficits  Physical Exam    LABORATORY DATA:  I have reviewed the data as listed    Latest Ref Rng & Units 09/29/2024    8:41 AM 07/02/2024    8:44 AM 02/28/2024    8:44 AM  CBC  WBC 4.0 - 10.5 K/uL 4.5  4.4  3.7   Hemoglobin 13.0 - 17.0 g/dL 85.5  85.3  85.0   Hematocrit 39.0 - 52.0 % 43.2  43.6  44.3   Platelets 150 - 400 K/uL 204  234  213         Latest Ref Rng & Units 09/29/2024    8:41 AM 07/02/2024    8:44 AM 02/28/2024    8:44 AM  CMP  Glucose 70 - 99 mg/dL 93  85  888   BUN 6 - 20 mg/dL 11  12  11    Creatinine 0.61 - 1.24 mg/dL 9.10  9.03  9.01   Sodium 135 - 145 mmol/L 142  142  140   Potassium 3.5 - 5.1 mmol/L 4.2  3.7  3.7   Chloride 98 - 111 mmol/L 105  107  103   CO2 22 - 32 mmol/L 26  27  29    Calcium 8.9 - 10.3 mg/dL 9.0  9.2  9.1   Total Protein 6.5 - 8.1 g/dL 7.9  8.0  8.0   Total Bilirubin 0.0 - 1.2 mg/dL 0.5  0.5  0.6   Alkaline Phos 38 - 126 U/L 92  95  86   AST 15 - 41 U/L 25  17  18    ALT 0 - 44 U/L 13  9  13        RADIOGRAPHIC STUDIES: I have personally reviewed the radiological images as listed and agreed with the findings in the report. No results found.    Orders Placed This Encounter  Procedures   Vitamin B12    Standing Status:   Standing    Number of Occurrences:   20    Expiration Date:   09/29/2025   All questions were answered. The patient knows to call the clinic with any problems, questions or concerns. No barriers to learning was detected. The total time spent in the appointment was 25 minutes, including review of chart and various  tests results, discussions about plan of care and coordination of care plan     Onita Mattock, MD 09/29/2024

## 2024-10-03 ENCOUNTER — Telehealth: Payer: Self-pay

## 2024-10-03 NOTE — Telephone Encounter (Signed)
 Called patient to relay the below results as per Dr. Lanny, patient had no further questions at this time.   Could you let him know his CT showed no evidence of cancer recurrence or other concerns? The reading was not back when I saw him. Thanks

## 2024-10-29 ENCOUNTER — Inpatient Hospital Stay

## 2024-11-26 ENCOUNTER — Inpatient Hospital Stay: Attending: Hematology

## 2024-11-26 DIAGNOSIS — Z9049 Acquired absence of other specified parts of digestive tract: Secondary | ICD-10-CM

## 2024-11-26 DIAGNOSIS — C187 Malignant neoplasm of sigmoid colon: Secondary | ICD-10-CM

## 2024-11-26 DIAGNOSIS — E860 Dehydration: Secondary | ICD-10-CM

## 2024-11-26 MED ORDER — CYANOCOBALAMIN 1000 MCG/ML IJ SOLN
1000.0000 ug | Freq: Once | INTRAMUSCULAR | Status: AC
  Administered 2024-11-26: 1000 ug via INTRAMUSCULAR
  Filled 2024-11-26: qty 1

## 2024-12-24 ENCOUNTER — Inpatient Hospital Stay: Attending: Hematology

## 2025-01-21 ENCOUNTER — Inpatient Hospital Stay: Attending: Hematology

## 2025-02-25 ENCOUNTER — Inpatient Hospital Stay: Attending: Hematology

## 2025-03-25 ENCOUNTER — Inpatient Hospital Stay: Attending: Hematology

## 2025-03-25 ENCOUNTER — Inpatient Hospital Stay

## 2025-03-25 ENCOUNTER — Inpatient Hospital Stay: Admitting: Hematology
